# Patient Record
Sex: Female | Born: 1935
Health system: Southern US, Community
[De-identification: ages and names within clinical notes are randomized; demographics above are authoritative.]

## PROBLEM LIST (undated history)

## (undated) DIAGNOSIS — M1990 Unspecified osteoarthritis, unspecified site: Secondary | ICD-10-CM

## (undated) DIAGNOSIS — C189 Malignant neoplasm of colon, unspecified: Secondary | ICD-10-CM

## (undated) DIAGNOSIS — Z9889 Other specified postprocedural states: Secondary | ICD-10-CM

## (undated) DIAGNOSIS — Z923 Personal history of irradiation: Secondary | ICD-10-CM

## (undated) DIAGNOSIS — C50919 Malignant neoplasm of unspecified site of unspecified female breast: Secondary | ICD-10-CM

## (undated) DIAGNOSIS — I8390 Asymptomatic varicose veins of unspecified lower extremity: Secondary | ICD-10-CM

## (undated) DIAGNOSIS — Z86010 Personal history of colonic polyps: Secondary | ICD-10-CM

## (undated) DIAGNOSIS — R112 Nausea with vomiting, unspecified: Secondary | ICD-10-CM

## (undated) DIAGNOSIS — Z860101 Personal history of adenomatous and serrated colon polyps: Secondary | ICD-10-CM

## (undated) HISTORY — DX: Personal history of colonic polyps: Z86.010

## (undated) HISTORY — PX: COLON RESECTION: SHX5231

## (undated) HISTORY — PX: ABDOMINAL HYSTERECTOMY: SHX81

## (undated) HISTORY — PX: BREAST BIOPSY: SHX20

## (undated) HISTORY — DX: Malignant neoplasm of colon, unspecified: C18.9

## (undated) HISTORY — PX: TONSILLECTOMY: SUR1361

## (undated) HISTORY — PX: OVARIAN CYST SURGERY: SHX726

## (undated) HISTORY — DX: Personal history of adenomatous and serrated colon polyps: Z86.0101

## (undated) HISTORY — PX: COLONOSCOPY W/ POLYPECTOMY: SHX1380

---

## 1992-09-21 DIAGNOSIS — C189 Malignant neoplasm of colon, unspecified: Secondary | ICD-10-CM

## 1992-09-21 HISTORY — PX: COLON SURGERY: SHX602

## 1992-09-21 HISTORY — DX: Malignant neoplasm of colon, unspecified: C18.9

## 1998-07-26 ENCOUNTER — Other Ambulatory Visit: Admission: RE | Admit: 1998-07-26 | Discharge: 1998-07-26 | Payer: Self-pay | Admitting: Obstetrics and Gynecology

## 1999-07-09 ENCOUNTER — Encounter: Payer: Self-pay | Admitting: Oncology

## 1999-07-09 ENCOUNTER — Encounter: Admission: RE | Admit: 1999-07-09 | Discharge: 1999-07-09 | Payer: Self-pay | Admitting: Oncology

## 1999-10-02 ENCOUNTER — Other Ambulatory Visit: Admission: RE | Admit: 1999-10-02 | Discharge: 1999-10-02 | Payer: Self-pay | Admitting: Obstetrics and Gynecology

## 2000-04-08 ENCOUNTER — Other Ambulatory Visit: Admission: RE | Admit: 2000-04-08 | Discharge: 2000-04-08 | Payer: Self-pay | Admitting: Gastroenterology

## 2000-07-14 ENCOUNTER — Encounter: Admission: RE | Admit: 2000-07-14 | Discharge: 2000-07-14 | Payer: Self-pay | Admitting: Oncology

## 2000-07-14 ENCOUNTER — Encounter: Payer: Self-pay | Admitting: Oncology

## 2001-02-11 ENCOUNTER — Encounter: Payer: Self-pay | Admitting: Obstetrics and Gynecology

## 2001-02-11 ENCOUNTER — Encounter: Admission: RE | Admit: 2001-02-11 | Discharge: 2001-02-11 | Payer: Self-pay | Admitting: Obstetrics and Gynecology

## 2001-03-21 ENCOUNTER — Other Ambulatory Visit: Admission: RE | Admit: 2001-03-21 | Discharge: 2001-03-21 | Payer: Self-pay | Admitting: Family Medicine

## 2001-07-18 ENCOUNTER — Encounter: Admission: RE | Admit: 2001-07-18 | Discharge: 2001-07-18 | Payer: Self-pay | Admitting: Family Medicine

## 2001-07-18 ENCOUNTER — Encounter: Payer: Self-pay | Admitting: Family Medicine

## 2004-05-22 ENCOUNTER — Encounter (INDEPENDENT_AMBULATORY_CARE_PROVIDER_SITE_OTHER): Payer: Self-pay | Admitting: Gastroenterology

## 2004-12-25 ENCOUNTER — Ambulatory Visit: Payer: Self-pay | Admitting: Family Medicine

## 2004-12-31 ENCOUNTER — Ambulatory Visit: Payer: Self-pay | Admitting: Family Medicine

## 2005-07-02 ENCOUNTER — Ambulatory Visit: Payer: Self-pay | Admitting: Internal Medicine

## 2005-12-04 ENCOUNTER — Ambulatory Visit: Payer: Self-pay | Admitting: Family Medicine

## 2006-12-29 ENCOUNTER — Ambulatory Visit: Payer: Self-pay | Admitting: Gastroenterology

## 2006-12-29 LAB — CONVERTED CEMR LAB
ALT: 19 units/L (ref 0–40)
AST: 22 units/L (ref 0–37)
Albumin: 3.5 g/dL (ref 3.5–5.2)
Alkaline Phosphatase: 56 units/L (ref 39–117)
BUN: 13 mg/dL (ref 6–23)
Basophils Absolute: 0 10*3/uL (ref 0.0–0.1)
Basophils Relative: 0.3 % (ref 0.0–1.0)
CO2: 32 meq/L (ref 19–32)
Calcium: 9.5 mg/dL (ref 8.4–10.5)
Chloride: 107 meq/L (ref 96–112)
Creatinine, Ser: 0.7 mg/dL (ref 0.4–1.2)
Eosinophils Absolute: 0.1 10*3/uL (ref 0.0–0.6)
Eosinophils Relative: 2.4 % (ref 0.0–5.0)
GFR calc Af Amer: 105 mL/min
GFR calc non Af Amer: 87 mL/min
Glucose, Bld: 93 mg/dL (ref 70–99)
HCT: 38.7 % (ref 36.0–46.0)
Hemoglobin: 13.3 g/dL (ref 12.0–15.0)
Lymphocytes Relative: 30.5 % (ref 12.0–46.0)
MCHC: 34.3 g/dL (ref 30.0–36.0)
MCV: 92.1 fL (ref 78.0–100.0)
Monocytes Absolute: 0.4 10*3/uL (ref 0.2–0.7)
Monocytes Relative: 9.9 % (ref 3.0–11.0)
Neutro Abs: 2.4 10*3/uL (ref 1.4–7.7)
Neutrophils Relative %: 56.9 % (ref 43.0–77.0)
Platelets: 232 10*3/uL (ref 150–400)
Potassium: 4 meq/L (ref 3.5–5.1)
RBC: 4.21 M/uL (ref 3.87–5.11)
RDW: 13.3 % (ref 11.5–14.6)
Sed Rate: 17 mm/hr (ref 0–25)
Sodium: 142 meq/L (ref 135–145)
Total Bilirubin: 1 mg/dL (ref 0.3–1.2)
Total Protein: 6.8 g/dL (ref 6.0–8.3)
WBC: 4.2 10*3/uL — ABNORMAL LOW (ref 4.5–10.5)

## 2007-02-03 ENCOUNTER — Ambulatory Visit: Payer: Self-pay | Admitting: Gastroenterology

## 2007-02-16 DIAGNOSIS — M810 Age-related osteoporosis without current pathological fracture: Secondary | ICD-10-CM

## 2007-02-16 DIAGNOSIS — Z85038 Personal history of other malignant neoplasm of large intestine: Secondary | ICD-10-CM | POA: Insufficient documentation

## 2007-02-16 DIAGNOSIS — K449 Diaphragmatic hernia without obstruction or gangrene: Secondary | ICD-10-CM | POA: Insufficient documentation

## 2007-05-03 ENCOUNTER — Telehealth (INDEPENDENT_AMBULATORY_CARE_PROVIDER_SITE_OTHER): Payer: Self-pay | Admitting: *Deleted

## 2007-05-05 ENCOUNTER — Encounter: Payer: Self-pay | Admitting: Family Medicine

## 2007-05-19 ENCOUNTER — Encounter (INDEPENDENT_AMBULATORY_CARE_PROVIDER_SITE_OTHER): Payer: Self-pay | Admitting: *Deleted

## 2007-06-22 ENCOUNTER — Encounter: Payer: Self-pay | Admitting: Family Medicine

## 2007-07-29 ENCOUNTER — Telehealth (INDEPENDENT_AMBULATORY_CARE_PROVIDER_SITE_OTHER): Payer: Self-pay | Admitting: *Deleted

## 2007-09-01 ENCOUNTER — Telehealth (INDEPENDENT_AMBULATORY_CARE_PROVIDER_SITE_OTHER): Payer: Self-pay | Admitting: *Deleted

## 2008-11-08 ENCOUNTER — Telehealth: Payer: Self-pay | Admitting: Internal Medicine

## 2008-11-08 ENCOUNTER — Ambulatory Visit: Payer: Self-pay | Admitting: Internal Medicine

## 2008-11-08 DIAGNOSIS — K644 Residual hemorrhoidal skin tags: Secondary | ICD-10-CM | POA: Insufficient documentation

## 2008-11-22 ENCOUNTER — Telehealth: Payer: Self-pay | Admitting: Nurse Practitioner

## 2008-11-23 ENCOUNTER — Ambulatory Visit: Payer: Self-pay | Admitting: Gastroenterology

## 2008-12-14 ENCOUNTER — Telehealth: Payer: Self-pay | Admitting: Nurse Practitioner

## 2008-12-14 ENCOUNTER — Ambulatory Visit: Payer: Self-pay | Admitting: Internal Medicine

## 2009-05-08 ENCOUNTER — Encounter: Admission: RE | Admit: 2009-05-08 | Discharge: 2009-05-08 | Payer: Self-pay | Admitting: Internal Medicine

## 2011-02-04 ENCOUNTER — Ambulatory Visit (AMBULATORY_SURGERY_CENTER): Payer: Medicare Other | Admitting: *Deleted

## 2011-02-04 ENCOUNTER — Telehealth: Payer: Self-pay | Admitting: *Deleted

## 2011-02-04 VITALS — Ht 64.0 in | Wt 142.1 lb

## 2011-02-04 DIAGNOSIS — Z85038 Personal history of other malignant neoplasm of large intestine: Secondary | ICD-10-CM

## 2011-02-04 DIAGNOSIS — Z86010 Personal history of colonic polyps: Secondary | ICD-10-CM

## 2011-02-04 MED ORDER — PEG-KCL-NACL-NASULF-NA ASC-C 100 G PO SOLR: ORAL | Status: DC

## 2011-02-04 NOTE — Telephone Encounter (Signed)
Patient has had multiple colonoscopies with Dr.Sam. Personal hx colon cancer and colon polyps. Last colonoscopy was 02/03/2007,recall every 4 years per report. Patient is direct,previsit done today. Ok for colonoscopy at this time? Chart on your desk.

## 2011-02-04 NOTE — Progress Notes (Signed)
Cannot find patient's pharmacy at this time. She believes its the [facility] on [address]. Print moviprep & gave to patient at this time.

## 2011-02-05 NOTE — Telephone Encounter (Signed)
Sheri - please call her and tell her current guidelines and my recommendation is to wait one more year for colonoscopy. We should cancel this one and replace recall. This assumes she is asymptomatic.

## 2011-02-05 NOTE — Telephone Encounter (Signed)
Patient does not have any symptoms, but she is insistent that she would like to proceed with colon.  I did explain no polyps at her last colon in 2008, but she wants to proceed with the colon as planned.  Dr Leone Payor please advise if ok.

## 2011-02-06 NOTE — Telephone Encounter (Signed)
I have left a message for the patient that she may keep her appt and she is asked to call back for any questions.

## 2011-02-06 NOTE — Assessment & Plan Note (Signed)
[facility]                         GASTROENTEROLOGY OFFICE NOTE   NAME:Teresa Boyd, Teresa Boyd                      MRN:          [id number]  DATE:12/29/2006                            DOB:          1931-05-22    This nice lady comes in on April 9. She said she wanted to see me before  she retired. She had a significant colon cancer that was resected a  number of years previous, she had some followup colon polyps. She said  she wanted to get a colonoscopy now.   PAST MEDICAL HISTORY:  She is doing alright as far as her GI system is  concerned. Her past medical history reveals only some minimal arthritis  as well as the colon cancer. She is status post hysterectomy and  appendectomy.   SOCIAL HISTORY:  She is still working, semi-retired, she is widowed. She  does not drink or smoke.   FAMILY HISTORY:  Diabetes.   REVIEW OF SYSTEMS:  Arthritis and skin rash.   PHYSICAL EXAMINATION:  VITAL SIGNS:  She is 5 feet 5, weighs 148. Blood  pressure is 122/84, pulse 80 and regular.  Neck, heart and extremities are all unremarkable.   IMPRESSION:  1. Status post colon cancer with hemicolectomy.  2. Arthritis.  3. Gastroesophageal reflux disease.   RECOMMENDATIONS:  Get some routine labs and schedule this patient for a  colonoscopic examination sometime in the near future.     Ulyess Mort, MD  Electronically Signed    SML/MedQ  DD: 12/29/2006  DT: 12/30/2006  Job #: [id number]

## 2011-02-06 NOTE — Telephone Encounter (Signed)
She may keep her appointment for colonoscopy now.

## 2011-02-18 ENCOUNTER — Ambulatory Visit (AMBULATORY_SURGERY_CENTER): Payer: Medicare Other | Admitting: Internal Medicine

## 2011-02-18 ENCOUNTER — Encounter: Payer: Self-pay | Admitting: Internal Medicine

## 2011-02-18 VITALS — BP 131/52 | HR 60 | Temp 98.3°F | Resp 14 | Ht 64.0 in | Wt 145.0 lb

## 2011-02-18 DIAGNOSIS — Z800 Family history of malignant neoplasm of digestive organs: Secondary | ICD-10-CM

## 2011-02-18 DIAGNOSIS — D126 Benign neoplasm of colon, unspecified: Secondary | ICD-10-CM

## 2011-02-18 DIAGNOSIS — Z85038 Personal history of other malignant neoplasm of large intestine: Secondary | ICD-10-CM

## 2011-02-18 DIAGNOSIS — Z1211 Encounter for screening for malignant neoplasm of colon: Secondary | ICD-10-CM

## 2011-02-18 DIAGNOSIS — Z86010 Personal history of colonic polyps: Secondary | ICD-10-CM

## 2011-02-18 MED ORDER — SODIUM CHLORIDE 0.9 % IV SOLN: 500.0000 mL | INTRAVENOUS | Status: DC

## 2011-02-18 NOTE — Patient Instructions (Signed)
DISCHARGED instructions given with verbal understanding. Handout on polyps given. Resume previous medications.

## 2011-02-19 ENCOUNTER — Telehealth: Payer: Self-pay | Admitting: *Deleted

## 2011-02-19 NOTE — Telephone Encounter (Signed)
Follow up Call- Patient questions:  Do you have a fever, pain , or abdominal swelling? no Pain Score  0 *  Have you tolerated food without any problems? yes  Have you been able to return to your normal activities? yes  Do you have any questions about your discharge instructions: Diet   no Medications  no Follow up visit  yes  Do you have questions or concerns about your Care? no  Actions: * If pain score is 4 or above: No action needed, pain <4.  Discussed with pt that follow up colonoscopy would be dependent on pathology results returned.

## 2011-03-06 ENCOUNTER — Encounter: Payer: Self-pay | Admitting: Internal Medicine

## 2011-03-06 NOTE — Progress Notes (Signed)
Quick Note:  Adenoma in setting of prior colon cancer and polyps and mother with colon cancer Plan on routine repeat colonoscopy in 5 years given her situation but health status to be assessed in person at that time ______

## 2011-03-09 ENCOUNTER — Telehealth: Payer: Self-pay | Admitting: Internal Medicine

## 2011-03-09 NOTE — Telephone Encounter (Signed)
I reviewed the results of the colon path with the patient and the letter that was mailed out on Friday.  She is advised that she should be getting the letter in the mail in the next day or so.  She will call back for any questions

## 2011-12-30 DIAGNOSIS — C189 Malignant neoplasm of colon, unspecified: Secondary | ICD-10-CM | POA: Insufficient documentation

## 2011-12-30 DIAGNOSIS — D72819 Decreased white blood cell count, unspecified: Secondary | ICD-10-CM | POA: Insufficient documentation

## 2011-12-30 DIAGNOSIS — Z8619 Personal history of other infectious and parasitic diseases: Secondary | ICD-10-CM | POA: Insufficient documentation

## 2012-04-05 ENCOUNTER — Other Ambulatory Visit: Payer: Self-pay | Admitting: Physician Assistant

## 2012-04-05 DIAGNOSIS — Z1231 Encounter for screening mammogram for malignant neoplasm of breast: Secondary | ICD-10-CM

## 2012-04-05 DIAGNOSIS — M810 Age-related osteoporosis without current pathological fracture: Secondary | ICD-10-CM

## 2012-06-22 ENCOUNTER — Ambulatory Visit (HOSPITAL_COMMUNITY): Payer: Medicare Other

## 2012-06-27 ENCOUNTER — Telehealth: Payer: Self-pay | Admitting: Internal Medicine

## 2012-06-28 NOTE — Telephone Encounter (Signed)
Patient had questions about non surgical options for hemorrhoids.   She is not having any current problems, but she has a friend that is.  Discussed injection and banding.  She will call back for any additional questions

## 2012-08-09 ENCOUNTER — Ambulatory Visit (HOSPITAL_COMMUNITY)
Admission: RE | Admit: 2012-08-09 | Discharge: 2012-08-09 | Disposition: A | Discharge status: Home / Self Care | Payer: Medicare Other | Source: Ambulatory Visit | Attending: Physician Assistant | Admitting: Physician Assistant

## 2012-08-09 DIAGNOSIS — Z1231 Encounter for screening mammogram for malignant neoplasm of breast: Secondary | ICD-10-CM | POA: Insufficient documentation

## 2012-08-23 ENCOUNTER — Other Ambulatory Visit: Payer: Self-pay | Admitting: Physician Assistant

## 2012-08-23 DIAGNOSIS — R928 Other abnormal and inconclusive findings on diagnostic imaging of breast: Secondary | ICD-10-CM

## 2012-09-01 ENCOUNTER — Ambulatory Visit
Admission: RE | Admit: 2012-09-01 | Discharge: 2012-09-01 | Disposition: A | Discharge status: Home / Self Care | Payer: Medicare Other | Source: Ambulatory Visit | Attending: Physician Assistant | Admitting: Physician Assistant

## 2012-09-01 DIAGNOSIS — R928 Other abnormal and inconclusive findings on diagnostic imaging of breast: Secondary | ICD-10-CM

## 2012-09-02 ENCOUNTER — Other Ambulatory Visit: Payer: Self-pay | Admitting: Physician Assistant

## 2012-09-12 ENCOUNTER — Other Ambulatory Visit: Payer: Self-pay | Admitting: Family Medicine

## 2012-09-12 ENCOUNTER — Other Ambulatory Visit: Payer: Self-pay | Admitting: Physician Assistant

## 2012-12-26 ENCOUNTER — Other Ambulatory Visit: Payer: Self-pay | Admitting: Obstetrics and Gynecology

## 2014-01-31 DIAGNOSIS — R350 Frequency of micturition: Secondary | ICD-10-CM | POA: Insufficient documentation

## 2015-03-14 ENCOUNTER — Encounter: Payer: Self-pay | Admitting: Gastroenterology

## 2015-09-22 DIAGNOSIS — C50919 Malignant neoplasm of unspecified site of unspecified female breast: Secondary | ICD-10-CM

## 2015-09-22 HISTORY — PX: BREAST LUMPECTOMY: SHX2

## 2015-09-22 HISTORY — DX: Malignant neoplasm of unspecified site of unspecified female breast: C50.919

## 2016-03-06 ENCOUNTER — Other Ambulatory Visit: Payer: Self-pay | Admitting: General Surgery

## 2016-03-06 DIAGNOSIS — R928 Other abnormal and inconclusive findings on diagnostic imaging of breast: Secondary | ICD-10-CM

## 2016-03-06 DIAGNOSIS — N6320 Unspecified lump in the left breast, unspecified quadrant: Secondary | ICD-10-CM

## 2016-03-17 ENCOUNTER — Ambulatory Visit
Admission: RE | Admit: 2016-03-17 | Discharge: 2016-03-17 | Disposition: A | Discharge status: Home / Self Care | Payer: Medicare Other | Source: Ambulatory Visit | Attending: General Surgery | Admitting: General Surgery

## 2016-03-17 ENCOUNTER — Other Ambulatory Visit: Payer: Self-pay | Admitting: General Surgery

## 2016-03-17 DIAGNOSIS — R928 Other abnormal and inconclusive findings on diagnostic imaging of breast: Secondary | ICD-10-CM

## 2016-03-17 DIAGNOSIS — N6320 Unspecified lump in the left breast, unspecified quadrant: Secondary | ICD-10-CM

## 2016-03-17 DIAGNOSIS — N6310 Unspecified lump in the right breast, unspecified quadrant: Secondary | ICD-10-CM

## 2016-03-17 MED ORDER — GADOBENATE DIMEGLUMINE 529 MG/ML IV SOLN
13.0000 mL | Freq: Once | INTRAVENOUS | Status: AC | PRN
  Administered 2016-03-17: 13 mL via INTRAVENOUS

## 2016-03-20 ENCOUNTER — Telehealth: Payer: Self-pay | Admitting: Oncology

## 2016-03-20 ENCOUNTER — Encounter: Payer: Self-pay | Admitting: Oncology

## 2016-03-20 ENCOUNTER — Telehealth: Payer: Self-pay | Admitting: Internal Medicine

## 2016-03-20 NOTE — Telephone Encounter (Signed)
Appointment with Dr. Jana Hakim on 7/7 at 3:15. Patient aware to arrive at 2:45pm. Demographics verified, directions given. Letter to the referring.

## 2016-03-20 NOTE — Telephone Encounter (Signed)
The pt states she has a return of breast cancer and will have treatment in a few weeks and would like to go ahead and set up recall colon. Is this ok Dr Carlean Purl?

## 2016-03-24 NOTE — Telephone Encounter (Signed)
She should see me in office to discuss her situation - I do not think its urgent

## 2016-03-25 NOTE — Telephone Encounter (Signed)
Left message for patient to call back  

## 2016-03-25 NOTE — Telephone Encounter (Signed)
Patient notified of recommendations.  She is scheduled at 4:00 05/13/16

## 2016-03-27 ENCOUNTER — Ambulatory Visit (HOSPITAL_BASED_OUTPATIENT_CLINIC_OR_DEPARTMENT_OTHER): Payer: Medicare Other | Admitting: Oncology

## 2016-03-27 VITALS — BP 157/81 | HR 76 | Temp 98.1°F | Resp 18 | Ht 64.0 in | Wt 138.3 lb

## 2016-03-27 DIAGNOSIS — Z8371 Family history of colonic polyps: Secondary | ICD-10-CM | POA: Diagnosis not present

## 2016-03-27 DIAGNOSIS — Z85038 Personal history of other malignant neoplasm of large intestine: Secondary | ICD-10-CM

## 2016-03-27 DIAGNOSIS — Z170 Estrogen receptor positive status [ER+]: Secondary | ICD-10-CM

## 2016-03-27 DIAGNOSIS — C50412 Malignant neoplasm of upper-outer quadrant of left female breast: Secondary | ICD-10-CM

## 2016-03-27 DIAGNOSIS — Z808 Family history of malignant neoplasm of other organs or systems: Secondary | ICD-10-CM

## 2016-03-27 NOTE — Progress Notes (Signed)
Teresa Boyd  [phone] [phone]     ID: Teresa Boyd DOB: [birth year]  MR#: [id number]  DJT#:[id number]  Patient Care Team: De Nurse, MD as PCP - General (Family Medicine) [name], MD as Consulting Physician (General Surgery) [name], MD as Consulting Physician (Gastroenterology) Chauncey Cruel, MD as Consulting Physician (Oncology) [name], MD as Attending Physician (Radiation Oncology) [name], MD as Consulting Physician (Family Medicine) OTHER MD:  CHIEF COMPLAINT: Estrogen receptor positive invasive breast cancer  CURRENT TREATMENT: Awaiting definitive surgery   BREAST CANCER HISTORY:  Teresa Boyd herself palpated a mass in her left breast mid-June 2017. She brought it to the attention of Dr. Coletta Memos who obtained screening mammography showing possible abnormalities in both breasts. [name] was then [facility] and ultrasonography 03/18/2015 showed in the right breast an 8 mm spiculated mass at the 11:00 position 5 cm from the nipple and a second area of altered architecture also in the upper outer quadrant. In the left breast there was a 2.5 cm is spiculated mass in the upper outer quadrant 4 cm from the nipple. Ultrasound of both axillae were negative.  On 03/17/2016 the patient underwent biopsy of all 3 suspicious areas. In the right breast, both areas showed only fibrocystic change, with no evidence of malignancy ([id number] [id number]). This was read as concordant by radiology.  In the left breast however the final pathology (SAA [id number]) showed an invasive ductal carcinoma, grade 2 or 3, estrogen receptor 5% positive, with moderate staining intensity, progesterone receptor negative, with an MIB-1 of 5%, and no HER-2 amplification, the signals ratio being 1.53 and the number per cell 3.45.  Teresa Boyd also has a remote history of colon cancer, in remission for more than 10 years with  the most recent colonoscopy 2012 negative  INTERVAL HISTORY: Teresa Boyd was evaluated in the breast clinic 03/27/2016 accompanied by her sister Teresa Boyd. Her son Teresa Boyd participated by phone. Teresa Boyd's case was also presented in the multidisciplinary breast cancer conference 03/25/2016. At that time a preliminary plan was proposed: Left lumpectomy without sentinel lymph node sampling. Consideration of adjuvant radiation depending on surgical results, and consideration of adjuvant anti-estrogens  REVIEW OF SYSTEMS: Aside from the mass itself, there were no specific symptoms leading to the original mammogram, which was routinely scheduled. The patient denies unusual headaches, visual changes, nausea, vomiting, stiff neck, dizziness, or gait imbalance. There has been no cough, phlegm production, or pleurisy, no chest pain or pressure, and no change in bowel or bladder habits. The patient denies fever, rash, bleeding, unexplained fatigue or unexplained weight loss. A detailed review of systems was otherwise entirely negative.    PAST MEDICAL HISTORY: Past Medical History  Diagnosis Date  . Colon cancer 1994  . Hx of adenomatous colonic polyps     PAST SURGICAL HISTORY: Past Surgical History  Procedure Laterality Date  . Colon resection      sigmoid  . Ovarian cyst surgery    . Abdominal hysterectomy    . Tonsillectomy    . Colonoscopy w/ polypectomy  last 02/18/11    multiple, prior colon cancer and polyps, 7 mm adenoma 2012    FAMILY HISTORY Family History  Problem Relation Age of Onset  . Colon cancer Mother 13  . Colon polyps Father 83  The patient's father died at age 31 from postoperative complications in the setting of peptic ulcer disease. The patient's mother died at age 77 with colon cancer.  The patient had 5 brothers, 2 sisters. One brother died in an automobile accident. One brother has colitis. A maternal aunt developed cancer in her "female organs" in her 78s. The patient has no  further information regarding this. She is not aware of any other cancers in the family aside of course from her own remote colon cancer.  GYNECOLOGIC HISTORY:  No LMP recorded. Patient is postmenopausal. Menarche age 70, first live birth age 56. The patient is Teresa Boyd P1 area in 1955 she underwent total abdominal hysterectomy with bilateral salpingo-oophorectomy. She tried hormone replacement briefly (less than a year). She never took oral contraceptives.  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 She is widowed and lives by herself with no pets. Her son Teresa Boyd is a pulmonologist in [place], [place]. The patient has 4 grandchildren, and no great grandchildren. She attends [employer] locally. 3 of her siblings live in town including of course her sister Teresa Boyd, who is a retired Rec[employer] consultant    ADVANCED DIRECTIVES:At the 03/27/2016 visit the patient was given the appropriate forms to complete and notarize at her discretion. She intends to name her son Teresa Boyd as healthcare power of attorney. He can be reached at [phone]  HEALTH MAINTENANCE: Social History  Substance Use Topics  . Smoking status: Never Smoker   . Smokeless tobacco: Never Used  . Alcohol Use: No     Colonoscopy:02/18/2011, [facility]; tubular adenoma with no high-grade dysplasia  Bone density: [facility], 01/08/2010, T score -2.2, decreased from prior   Allergies  Allergen Reactions  . Celebrex [Celecoxib]   . Clindamycin   . Conjugated Estrogens   . Estradiol Benzoate   . Estrogens Conjugated   . Hypaque-M [Diatrizone Sodium-Diatrizone Meglumine] Hives    Ct scan IVP dye  . Penicillins     Current Outpatient Prescriptions  Medication Sig Dispense Refill  . calcitonin, salmon, (MIACALCIN) 200 UNIT/ACT nasal spray 1 spray by Nasal route daily.      Marland Kitchen CALCIUM PO Take 1 tablet by mouth 2 (two) times daily.      . Multiple Vitamin (MULTIVITAMIN) tablet Take 1 tablet by mouth  daily.       Current Facility-Administered Medications  Medication Dose Route Frequency Provider Last Rate Last Dose  . 0.9 %  sodium chloride infusion  500 mL Intravenous Continuous [name], MD        OBJECTIVE: Older white woman in no acute distress  Filed Vitals:   03/27/16 1449  BP: 157/81  Pulse: 76  Temp: 98.1 F (36.7 C)  Resp: 18     Body mass index is 23.73 kg/(m^2).    ECOG FS:0 - Asymptomatic  Ocular: Sclerae unicteric, pupils equal, round and reactive to light Ear-nose-throat: Oropharynx clear and moist Lymphatic: No cervical or supraclavicular adenopathy Lungs no rales or rhonchi, good excursion bilaterally Heart regular rate and rhythm, no murmur appreciated Abd soft, nontender, positive bowel sounds MSK no focal spinal tenderness, no joint edema Neuro: non-focal, well-oriented, appropriate affect Breasts: The right breast is status post recent biopsies. There is minimal ecchymosis. There is no finding of concern. The right axilla is benign. The left breast is also status post recent biopsy. In the upper outer quadrant, there is a palpable mass measuring approximately 2 cm, not associated with erythema or tenderness. There is no nipple inversion. The left axilla is benign.   LAB RESULTS:  CMP     Component Value Date/Time   NA 142 12/29/2006  1210   K 4.0 12/29/2006 1210   CL 107 12/29/2006 1210   CO2 32 12/29/2006 1210   GLUCOSE 93 12/29/2006 1210   BUN 13 12/29/2006 1210   CREATININE 0.7 12/29/2006 1210   CALCIUM 9.5 12/29/2006 1210   PROT 6.8 12/29/2006 1210   ALBUMIN 3.5 12/29/2006 1210   AST 22 12/29/2006 1210   ALT 19 12/29/2006 1210   ALKPHOS 56 12/29/2006 1210   BILITOT 1.0 12/29/2006 1210   GFRNONAA 87 12/29/2006 1210   GFRAA 105 12/29/2006 1210    INo results found for: SPEP, UPEP  Lab Results  Component Value Date   WBC 4.2* 12/29/2006   NEUTROABS 2.4 12/29/2006   HGB 13.3 12/29/2006   HCT 38.7 12/29/2006   MCV 92.1 12/29/2006     PLT 232 12/29/2006      Chemistry      Component Value Date/Time   NA 142 12/29/2006 1210   K 4.0 12/29/2006 1210   CL 107 12/29/2006 1210   CO2 32 12/29/2006 1210   BUN 13 12/29/2006 1210   CREATININE 0.7 12/29/2006 1210      Component Value Date/Time   CALCIUM 9.5 12/29/2006 1210   ALKPHOS 56 12/29/2006 1210   AST 22 12/29/2006 1210   ALT 19 12/29/2006 1210   BILITOT 1.0 12/29/2006 1210       No results found for: LABCA2  No components found for: LABCA125  No results for input(s): INR in the last 168 hours.  Urinalysis No results found for: COLORURINE, APPEARANCEUR, LABSPEC, PHURINE, GLUCOSEU, HGBUR, BILIRUBINUR, [place], PROTEINUR, UROBILINOGEN, NITRITE, LEUKOCYTESUR   STUDIES: Mm Diag Breast Tomo Bilateral  03/17/2016  CLINICAL DATA:  Status post MRI guided biopsy of left breast 12 o'clock mass, ultrasound-guided biopsy of right breast 11 o'clock mass, and stereotactic guided biopsy of right breast upper outer quadrant posterior focal asymmetry. EXAM: DIAGNOSTIC BILATERAL MAMMOGRAM POST MRI, ULTRASOUND AND STEREOTACTIC BIOPSY COMPARISON:  Previous exam(s). FINDINGS: Mammographic images were obtained following MRI guided biopsy of left breast 12 o'clock mass, ultrasound-guided biopsy of right breast 11 o'clock anterior depth mass, and stereotactic core needle biopsy of right breast upper outer quadrant posterior depth focal asymmetry. Two-view left breast mammography demonstrates presence of dumbbell-shaped tissue marker within spiculated left breast 12 o'clock mass. Expected post biopsy changes are seen. Two-view right breast mammography demonstrates presence of ribbon shaped tissue marker within right breast 11 o'clock mass, anterior depth. X shaped tissue marker is present within the right breast upper outer quadrant, posterior depth. The X shaped marker, placed after stereotactic core needle biopsy of the right breast upper outer quadrant posterior focal asymmetry, is  noted to be located 4 cm inferior to the biopsy site on the mediolateral oblique view, and overlies the biopsy cavity on the craniocaudal view. Post biopsy changes are noted. IMPRESSION: Successful placement of dumbbell-shaped tissue marker within left breast 12 o'clock mass, post MRI guided core needle biopsy. Successful placement of coil shaped tissue marker within the right breast 11 o'clock mass, post ultrasound-guided core needle biopsy. Successful placement of X shaped tissue marker within the right breast upper outer quadrant, posterior depth, focal asymmetry, post stereotactic guided core needle biopsy. The X shaped tissue marker is noted to be located 4 cm inferior to the biopsy site. The biopsy site is marked on the postprocedural images, should this area need to be excised. Final Assessment: Post Procedure Mammograms for Marker Placement Electronically Signed   By: Fidela Salisbury M.D.   On: 03/17/2016 14:10  Mr Aundra Millet Breast Bx Johnella Moloney Dev 1st Lesion Image Bx Spec Mr Guide  03/18/2016  ADDENDUM REPORT: 03/18/2016 11:54 ADDENDUM: Pathology revealed grade II to III invasive ductal carcinoma and ductal carcinoma in situ in the left breast. The right breast at 11:00 showed fibrocystic changes and dense fibrosis. The upper outer quadrant of the right breast showed fibrocystic changes. This was found to be concordant by Dr. Fidela Salisbury. Pathology results were discussed with the patient by telephone. The patient reported doing well after the biopsy. Post biopsy instructions and care were reviewed and questions were answered. The patient was encouraged to call [facility] for any additional concerns. She has seen Dr. [name] at [facility] and has a follow-up appointment on March 19, 2016. Pathology results reported by Susa Raring RN, BSN on 03/18/2016. Electronically Signed   By: Fidela Salisbury M.D.   On: 03/18/2016 11:54  03/18/2016   CLINICAL DATA:  Suspicious left 12 o'clock breast mass. EXAM: MRI GUIDED CORE NEEDLE BIOPSY OF THE LEFT BREAST TECHNIQUE: Multiplanar, multisequence MR imaging of the bilateral breasts was performed both before and after administration of intravenous contrast. CONTRAST:  44m MULTIHANCE GADOBENATE DIMEGLUMINE 529 MG/ML IV SOLN COMPARISON:  Previous exams. FINDINGS: I met with the patient, and we discussed the procedure of MRI guided biopsy, including risks, benefits, and alternatives. Specifically, we discussed the risks of infection, bleeding, tissue injury, clip migration, and inadequate sampling. Informed, written consent was given. The usual time out protocol was performed immediately prior to the procedure. Using sterile technique, 1% Lidocaine, MRI guidance, and a 9 gauge vacuum assisted device, biopsy was performed of left 12 o'clock breast mass using a lateral approach. At the conclusion of the procedure, a dumbbell-shaped tissue marker clip was deployed into the biopsy cavity. Follow-up 2-view mammogram was performed and dictated separately. IMPRESSION: MRI guided biopsy of left 12 o'clock breast mass. No apparent complications. Electronically Signed: By: DFidela SalisburyM.D. On: 03/17/2016 13:58   Mm Rt Breast Bx W Loc Dev 1st Lesion Image Bx Spec Stereo Guide  03/18/2016  ADDENDUM REPORT: 03/18/2016 11:55 ADDENDUM: Pathology revealed grade II to III invasive ductal carcinoma and ductal carcinoma in situ in the left breast. The right breast at 11:00 showed fibrocystic changes and dense fibrosis. The upper outer quadrant of the right breast showed fibrocystic changes. This was found to be concordant by Dr. DFidela Salisbury Pathology results were discussed with the patient by telephone. The patient reported doing well after the biopsy. Post biopsy instructions and care were reviewed and questions were answered. [facility]. Pathology results reported by LSusa RaringRN, BSN on 03/18/2016. Electronically Signed   By: DFidela SalisburyM.D.   On: 03/18/2016 11:55  03/18/2016  CLINICAL DATA:  Right breast upper outer quadrant mass/ focal asymmetry seen mammographically. EXAM: RIGHT BREAST STEREOTACTIC CORE NEEDLE BIOPSY COMPARISON:  Previous exams. FINDINGS: A stereotactic biopsy was performed of a focal asymmetry seen mammographically in the right breast upper outer quadrant, posterior depth. The patient and I discussed the procedure of stereotactic-guided biopsy including benefits and alternatives. We discussed the high likelihood of a successful procedure. We discussed the risks of the procedure including infection, bleeding, tissue injury, clip migration, and inadequate sampling. Informed written consent was given. The  usual time out protocol was performed immediately prior to the procedure. Using sterile technique and 1% Lidocaine as local anesthetic, under stereotactic guidance, a 9 gauge vacuum assisted device was used to perform core needle biopsy of mass/focal asymmetry in the right breast upper outer quadrant, posterior depth using a superior approach. Specimen radiograph was not performed as no radiographically apparent calcifications were identified. At the conclusion of the procedure, a X shaped tissue marker clip was deployed into the biopsy cavity. Follow-up 2-view mammogram was performed and dictated separately. IMPRESSION: Stereotactic-guided biopsy of right breast upper outer quadrant focal asymmetry/mass. No apparent complications. Electronically Signed: By: Fidela Salisbury M.D. On: 03/17/2016 13:50   [place] Rt Breast Bx W Loc Dev 1st Lesion Img Bx Spec US Guide  03/18/2016  ADDENDUM REPORT: 03/18/2016 11:54 ADDENDUM: Pathology revealed grade II to III invasive ductal  carcinoma and ductal carcinoma in situ in the left breast. The right breast at 11:00 showed fibrocystic changes and dense fibrosis. The upper outer quadrant of the right breast showed fibrocystic changes. This was found to be concordant by Dr. Fidela Salisbury. Pathology results were discussed with the patient by telephone. The patient reported doing well after the biopsy. Post biopsy instructions and care were reviewed and questions were answered. The patient was encouraged to call [facility] for any additional concerns. She has seen Dr. [name] at [facility] and has a follow-up appointment on March 19, 2016. Pathology results reported by Susa Raring RN, BSN on 03/18/2016. Electronically Signed   By: Fidela Salisbury M.D.   On: 03/18/2016 11:54  03/18/2016  CLINICAL DATA:  Right 11 o'clock breast mass. EXAM: ULTRASOUND GUIDED RIGHT BREAST CORE NEEDLE BIOPSY COMPARISON:  Previous exam(s). FINDINGS: I met with the patient and we discussed the procedure of ultrasound-guided biopsy, including benefits and alternatives. We discussed the high likelihood of a successful procedure. We discussed the risks of the procedure, including infection, bleeding, tissue injury, clip migration, and inadequate sampling. Informed written consent was given. The usual time-out protocol was performed immediately prior to the procedure. Using sterile technique and 1% Lidocaine as local anesthetic, under direct ultrasound visualization, a 14 gauge spring-loaded device was used to perform biopsy of right 11 o'clock breast mass using a inferior approach. At the conclusion of the procedure a ribbon shaped tissue marker clip was deployed into the biopsy cavity. Follow up 2 view mammogram was performed and dictated separately. Judging by the position of the ribbon shaped tissue marker, the sonographically identified and biopsied mass does not correspond to the originally  mammographically identified focal asymmetry in the right breast upper outer quadrant, posterior depth. Therefore, after discussing this fact with the patient and obtaining consent from her, a second stereotactically guided right breast biopsy was performed for the posterior right breast upper outer quadrant mammographically identified focal asymmetry/mass. IMPRESSION: Ultrasound guided biopsy of right 11 o'clock breast mass. No apparent complications. Electronically Signed: By: Fidela Salisbury M.D. On: 03/17/2016 14:01    ELIGIBLE FOR AVAILABLE RESEARCH PROTOCOL: PALLAS  ASSESSMENT: 82 y.o. Teresa Boyd woman status post left breast upper outer quadrant biopsy 03/17/2016 for a clinical T2 N0, stage IIa invasive ductal carcinoma, grade 2 or 3, minimally estrogen receptor positive at 5% with moderate staining intensity, progesterone receptor and HER-2 negative, with an MIB-1 of 5%  (1) definitive surgery pending  (2) consideration of adjuvant radiation depending on surgical results  (3) consideration of adjuvant anti-estrogens  PLAN: We spent the better part  of today's hour-long appointment discussing the biology of breast cancer in general, and the specifics of the patient's tumor in particular. We first reviewed the fact that cancer is not one disease but more than 100 different diseases and that it is important to keep them separate-- otherwise when friends and relatives discuss their own cancer experiences with Teresa Boyd confusion can result. Similarly we explained that if breast cancer spreads to the bone or liver, the patient would not have bone cancer or liver cancer, but breast cancer in the bone and breast cancer in the liver: one cancer in three places-- not 3 different cancers which otherwise would have to be treated in 3 different ways.  We discussed the difference between local and systemic therapy. In terms of loco-regional treatment, lumpectomy plus radiation is equivalent to mastectomy  as far as survival is concerned. For this reason we generally recommend breast conserving surgery. She understands there may be some distortion of the breast contour postop, but this is not a major concern to her.   We also noted that in terms of sequencing of treatments, whether systemic therapy or surgery is done first does not affect the ultimate outcome.  Finally, while we frequently forego adjuvant radiation in women over 34, my recommendation in Teresa Boyd's case is that she do receive radiation. Her cancer is just about triple negative and I don't think anti-estrogens will provide significant assurance in terms of preventing a recurrence. In addition the cancer is stage II. Given these facts and her excellent functional status, I am arranging for her to meet with Dr. Isidore Moos and radiation early August, by which time the patient should have had her definitive surgery.  We then discussed the rationale for systemic therapy. There is some risk that this cancer may have already spread to other parts of her body. Patients frequently ask about bone scans CAT scans and PET scans when they understand this, but in early stage disease we are much more likely to find false positives then to cancers and this would expose the patient to unnecessary procedures as well as unnecessary radiation. Scans cannot answer the question the patient really would like to know, which is whether she has microscopic disease elsewhere in her body.  Of course we would proceed to aggressive evaluation of any symptoms that might suggest metastatic disease, but that is not the case here.  We then discussed the options for systemic therapy which are anti-estrogens, anti-HER-2 immunotherapy, and chemotherapy. Chemotherapy generally works best in rapidly growing aggressive tumors. We have a slow-growing tumor which is however on the aggressive side as far as grade is concerned. I think a Mammaprint's test will help [place] resolve this conundrum  but the patient understands I'm going to be very reluctant to proceed to chemotherapy unless we have a clear indication of benefit, which is not the case at this point.  She does not meet criteria for anti-HER-2 immunotherapy. She is a candidate for anti-estrogens. The concern of course is that aromatase inhibitors can worsen her osteopenia or osteoporosis, and that tamoxifen can cause blood clots. We're going to return to these questions when she sees me again, at the end of her radiation treatments. Given the fact that the cancer is only minimally estrogen receptor positive, we may choose observation alone.  Teresa Boyd has a good understanding of the overall plan. She agrees with it. She knows the goal of treatment in her case is cure. She will call with any problems that may develop before her next  visit here.  Chauncey Cruel, MD   03/28/2016 11:22 AM Medical Oncology and Hematology [facility] [address] [place], [place] [address] Tel. [phone]    Fax. [phone]

## 2016-04-02 ENCOUNTER — Telehealth: Payer: Self-pay | Admitting: *Deleted

## 2016-04-02 NOTE — Telephone Encounter (Signed)
  Oncology Nurse Navigator Documentation  Navigator Location: [facility]Med Onc (04/02/16 1500) Navigator Encounter Type: Introductory phone call (04/02/16 1500)   Abnormal Finding Date: 03/17/16 (04/02/16 1500) Confirmed Diagnosis Date: 03/17/16 (04/02/16 1500)     Patient Visit Type: MedOnc;Initial (04/02/16 1500) Treatment Phase: Pre-Tx/Tx Discussion (04/02/16 1500) Barriers/Navigation Needs: No barriers at this time (04/02/16 1500)                Acuity: Level 2 (04/02/16 1500)   Acuity Level 2: Initial guidance, education and coordination as needed;Educational needs;Assistance expediting appointments;Ongoing guidance and education throughout treatment as needed (04/02/16 1500)     Time Spent with Patient: 15 (04/02/16 1500)

## 2016-04-06 ENCOUNTER — Other Ambulatory Visit: Payer: Self-pay | Admitting: General Surgery

## 2016-04-06 DIAGNOSIS — C50412 Malignant neoplasm of upper-outer quadrant of left female breast: Secondary | ICD-10-CM

## 2016-04-13 ENCOUNTER — Other Ambulatory Visit: Payer: Self-pay | Admitting: General Surgery

## 2016-04-13 DIAGNOSIS — C50412 Malignant neoplasm of upper-outer quadrant of left female breast: Secondary | ICD-10-CM

## 2016-04-17 ENCOUNTER — Encounter (HOSPITAL_BASED_OUTPATIENT_CLINIC_OR_DEPARTMENT_OTHER): Payer: Self-pay | Admitting: *Deleted

## 2016-04-21 ENCOUNTER — Encounter: Payer: Self-pay | Admitting: Internal Medicine

## 2016-04-21 ENCOUNTER — Other Ambulatory Visit: Payer: Self-pay | Admitting: General Surgery

## 2016-04-21 DIAGNOSIS — C50412 Malignant neoplasm of upper-outer quadrant of left female breast: Secondary | ICD-10-CM

## 2016-04-21 NOTE — H&P (Signed)
Teresa Boyd Location: [facility] Patient #: [id number] DOB: 19-Mar-1931 Widowed / Language: Undefined / Race: White Female      History of Present Illness      This is a pleasant 82 year old Caucasian female who returns for further discussion and definitive planning for her surgery for her left breast cancer. Dr. Gunnar Bulla Magrinat is her medical oncologist. She will be seeing [name] in August after her surgery.      She originally presented with a palpable mass in the left breast and imaging studies showed bilateral abnormalities. In the left breast was a 2.5 cm spiculated mass at 12 o'clock position, 4 cm from the nipple. It feels larger. Left axillary ultrasound was negative. In the right breast there was 8 mm spiculated mass at the 11 o'clock position 5 cm from the nipple     Imaging image guided biopsy of the 2 right breast masses showed fibrocystic change and was felt to be concordant. Image guided biopsy of the palpable left breast mass shows invasive ductal carcinoma, estrogen receptor weakly positive.     Comorbidities include colon Resection by me 1994 with adjuvant chemotherapy by Dr. Beryle Beams. No recurrence. She is a widow. Son is a pulmonologist in [place] and had I have talked with him at least twice including today. He listened in and was part of our conversation today. 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 She is a widow since 40. Denies alcohol or tobacco. Her biggest concern is getting back to work as quick as possible after the surgery.       We've had a long conversation with her and other family members. She has seen Dr. Jana Hakim who states she does not need a sentinel node biopsy. Mastectomy without radiation therapy is an option. Lumpectomy with radiation therapy is an option. She knows that lumpectomy will result in a significant volume loss which may be noticeable or may not. She says this  doesn't bother her and she would prefer to have the lumpectomy such he can get back to work. She says if asymmetry is a problem she will deal with that later but she doesn't think so.      We have decided to go ahead and schedule her for left breast lumpectomy. We going to do double wire localization. I have requested a medial and a lateral wire to bracket the large cancer and to have these placed from the cephalad to- caudad orientation and we'll plan a transverse curvilinear incision in the upper breast and try to do some tissue transfer to get the best result we can. She knows that we may or may not get negative margins that she may have to have further surgery. We're hoping for negative margins obviously. She doesn't this is the biggest issue in her surgical care. I spent essentially 45 minutes discussing this with her and her son and reviewing all of her old records and going through her options.      She'll be scheduled for left breast lumpectomy with double wire localization. She will not need a sentinel node biopsy. I discussed the indications, details, techniques, and numerous risk of this with her. She is aware the risks of bleeding, infection, cosmetic deformity, reoperation for positive margins, nerve damage with chronic pain, cardiac pulmonary thromboembolic problems. She understands these issues well. At this time all questions are answered. She agrees with this plan.   Allergies  Clindamycin HCl *CHEMICALS* PenicillAMINE *ASSORTED CLASSES* Estradiol *CHEMICALS* Diatrizoate  Meglumine & Sodium *DIAGNOSTIC PRODUCTS* Celecoxib *ANALGESICS - ANTI-INFLAMMATORY*  Medication History  Calcitonin ([place]) (200UNIT/ACT Solution, Nasal) Active. Pataday (0.2% Solution, Ophthalmic) Active. Medications Reconciled  Vitals  Weight: 137 lb Height: 64in Body Surface Area: 1.67 m Body Mass Index: 23.52 kg/m  Temp.: 46F(Temporal)  Pulse: 76 (Regular)  BP: 124/74  (Sitting, Left Arm, Standard)    Physical Exam  General Mental Status-Alert. General Appearance-Not in acute distress. Build & Nutrition-Well nourished. Posture-Normal posture. Gait-Normal.  Head and Neck Head-normocephalic, atraumatic with no lesions or palpable masses. Trachea-midline. Thyroid Gland Characteristics - normal size and consistency and no palpable nodules.  Chest and Lung Exam Chest and lung exam reveals -on auscultation, normal breath sounds, no adventitious sounds and normal vocal resonance.  Breast Note: Breasts are medium sized, not small. Skin healthy. Nipple and areola complex normal. In the left breast at 12:00 there is a 3.5 cm mass which is quite obvious. This is quite mobile. It is not involving the nipple or areola. It does not involve the chest wall. There is no axillary adenopathy on either side. The right breast exam is unremarkable. Minimal bruising from her biopsies have essentially resolved.   Cardiovascular Cardiovascular examination reveals -normal heart sounds, regular rate and rhythm with no murmurs and femoral artery auscultation bilaterally reveals normal pulses, no bruits, no thrills.  Abdomen Note: Abdomen soft and nontender. No organomegaly. No mass. Lower midline scar well-healed. Vertical scar right paramedian lower abdomen. No hernias or masses. No tenderness. Nondistended   Neurologic Neurologic evaluation reveals -alert and oriented x 3 with no impairment of recent or remote memory, normal attention span and ability to concentrate, normal sensation and normal coordination.  Musculoskeletal Normal Exam - Bilateral-Upper Extremity Strength Normal and Lower Extremity Strength Normal.    Assessment & Plan PRIMARY CANCER OF UPPER OUTER QUADRANT OF LEFT FEMALE BREAST (C50.412)       You have seen Dr. Jana Hakim and he recommends lumpectomy without any lymph node biopsies. The most important thing surgically is  to completely remove the tumor from her breast and had a negative margin, as we discussed.      Since her tumor is relatively large we will have to make a transverse incision in the upper half of her breast and there will certainly be some volume loss. You have told as this is acceptable.      The day of the surgery you will first go to radiology to have them put 2 wires in your left breast, one to define the medial and one to define the lateral extent of the tumor. [facility] you should be able to go home the same day.  We have discussed the techniques, indications, and numerous risk of the surgery in detail. We will do everything humanly possible to coordinate with your son's work schedule.  HISTORY OF COLON CANCER (Z85.038) FAMILY HISTORY OF COLON CANCER (Z80.0) FIBROCYSTIC BREAST DISEASE, RIGHT (N60.11) LARGE MASS OF LEFT BREAST (N63)    Edsel Petrin. Dalbert Batman, M.D., [facility] General and Minimally invasive Surgery Breast and Colorectal Surgery Office:   [phone] Pager:   [phone]

## 2016-04-22 ENCOUNTER — Encounter (HOSPITAL_BASED_OUTPATIENT_CLINIC_OR_DEPARTMENT_OTHER): Payer: Self-pay | Admitting: *Deleted

## 2016-04-22 ENCOUNTER — Ambulatory Visit: Payer: Medicare Other | Admitting: Radiation Oncology

## 2016-04-22 ENCOUNTER — Ambulatory Visit
Admission: RE | Admit: 2016-04-22 | Discharge: 2016-04-22 | Disposition: A | Discharge status: Home / Self Care | Payer: Medicare Other | Source: Ambulatory Visit | Attending: General Surgery | Admitting: General Surgery

## 2016-04-22 ENCOUNTER — Ambulatory Visit (HOSPITAL_BASED_OUTPATIENT_CLINIC_OR_DEPARTMENT_OTHER)
Admission: RE | Admit: 2016-04-22 | Discharge: 2016-04-22 | Disposition: A | Discharge status: Home / Self Care | Payer: Medicare Other | Source: Ambulatory Visit | Attending: General Surgery | Admitting: General Surgery

## 2016-04-22 ENCOUNTER — Encounter (HOSPITAL_BASED_OUTPATIENT_CLINIC_OR_DEPARTMENT_OTHER)
Admission: RE | Disposition: A | Discharge status: Home / Self Care | Payer: Self-pay | Source: Ambulatory Visit | Attending: General Surgery

## 2016-04-22 ENCOUNTER — Ambulatory Visit (HOSPITAL_BASED_OUTPATIENT_CLINIC_OR_DEPARTMENT_OTHER): Payer: Medicare Other | Admitting: Anesthesiology

## 2016-04-22 DIAGNOSIS — Z170 Estrogen receptor positive status [ER+]: Secondary | ICD-10-CM | POA: Insufficient documentation

## 2016-04-22 DIAGNOSIS — Z85038 Personal history of other malignant neoplasm of large intestine: Secondary | ICD-10-CM | POA: Insufficient documentation

## 2016-04-22 DIAGNOSIS — C50412 Malignant neoplasm of upper-outer quadrant of left female breast: Secondary | ICD-10-CM

## 2016-04-22 DIAGNOSIS — Z79899 Other long term (current) drug therapy: Secondary | ICD-10-CM | POA: Insufficient documentation

## 2016-04-22 DIAGNOSIS — D0512 Intraductal carcinoma in situ of left breast: Secondary | ICD-10-CM | POA: Diagnosis not present

## 2016-04-22 DIAGNOSIS — N6011 Diffuse cystic mastopathy of right breast: Secondary | ICD-10-CM | POA: Insufficient documentation

## 2016-04-22 HISTORY — DX: Malignant neoplasm of unspecified site of unspecified female breast: C50.919

## 2016-04-22 HISTORY — PX: PARTIAL MASTECTOMY WITH NEEDLE LOCALIZATION: SHX6008

## 2016-04-22 HISTORY — DX: Asymptomatic varicose veins of unspecified lower extremity: I83.90

## 2016-04-22 SURGERY — PARTIAL MASTECTOMY WITH NEEDLE LOCALIZATION
Anesthesia: Regional | Site: Breast | Laterality: Left

## 2016-04-22 MED ORDER — HYDROCODONE-ACETAMINOPHEN 5-325 MG PO TABS: 1.0000 | ORAL_TABLET | Freq: Four times a day (QID) | ORAL | 0 refills | Status: DC | PRN

## 2016-04-22 MED ORDER — CHLORHEXIDINE GLUCONATE CLOTH 2 % EX PADS: 6.0000 | MEDICATED_PAD | Freq: Once | CUTANEOUS | Status: DC

## 2016-04-22 MED ORDER — BUPIVACAINE-EPINEPHRINE (PF) 0.5% -1:200000 IJ SOLN
INTRAMUSCULAR | Status: AC
  Filled 2016-04-22: qty 30

## 2016-04-22 MED ORDER — PHENYLEPHRINE 40 MCG/ML (10ML) SYRINGE FOR IV PUSH (FOR BLOOD PRESSURE SUPPORT)
PREFILLED_SYRINGE | INTRAVENOUS | Status: DC | PRN
  Administered 2016-04-22 (×3): 80 ug via INTRAVENOUS
  Administered 2016-04-22: 40 ug via INTRAVENOUS
  Administered 2016-04-22: 80 ug via INTRAVENOUS

## 2016-04-22 MED ORDER — LIDOCAINE 2% (20 MG/ML) 5 ML SYRINGE
INTRAMUSCULAR | Status: DC | PRN
  Administered 2016-04-22: 60 mg via INTRAVENOUS

## 2016-04-22 MED ORDER — BUPIVACAINE-EPINEPHRINE 0.5% -1:200000 IJ SOLN
INTRAMUSCULAR | Status: DC | PRN
  Administered 2016-04-22: 10 mL

## 2016-04-22 MED ORDER — SODIUM BICARBONATE 4 % IV SOLN
INTRAVENOUS | Status: AC
  Filled 2016-04-22: qty 5

## 2016-04-22 MED ORDER — MIDAZOLAM HCL 2 MG/2ML IJ SOLN
INTRAMUSCULAR | Status: AC
  Filled 2016-04-22: qty 2

## 2016-04-22 MED ORDER — FENTANYL CITRATE (PF) 100 MCG/2ML IJ SOLN
50.0000 ug | INTRAMUSCULAR | Status: DC | PRN
  Administered 2016-04-22: 50 ug via INTRAVENOUS

## 2016-04-22 MED ORDER — GABAPENTIN 300 MG PO CAPS
ORAL_CAPSULE | ORAL | Status: AC
  Filled 2016-04-22: qty 1

## 2016-04-22 MED ORDER — DEXAMETHASONE SODIUM PHOSPHATE 4 MG/ML IJ SOLN
INTRAMUSCULAR | Status: DC | PRN
  Administered 2016-04-22: 5 mg via INTRAVENOUS

## 2016-04-22 MED ORDER — ONDANSETRON HCL 4 MG/2ML IJ SOLN: 4.0000 mg | Freq: Once | INTRAMUSCULAR | Status: DC | PRN

## 2016-04-22 MED ORDER — ACETAMINOPHEN 500 MG PO TABS
ORAL_TABLET | ORAL | Status: AC
  Filled 2016-04-22: qty 5

## 2016-04-22 MED ORDER — CEFAZOLIN SODIUM-DEXTROSE 2-3 GM-% IV SOLR
INTRAVENOUS | Status: DC | PRN
  Administered 2016-04-22: 2 g via INTRAVENOUS

## 2016-04-22 MED ORDER — LACTATED RINGERS IV SOLN
INTRAVENOUS | Status: DC
  Administered 2016-04-22 (×3): via INTRAVENOUS

## 2016-04-22 MED ORDER — GLYCOPYRROLATE 0.2 MG/ML IJ SOLN: 0.2000 mg | Freq: Once | INTRAMUSCULAR | Status: DC | PRN

## 2016-04-22 MED ORDER — FENTANYL CITRATE (PF) 100 MCG/2ML IJ SOLN
25.0000 ug | INTRAMUSCULAR | Status: DC | PRN
  Administered 2016-04-22 (×2): 25 ug via INTRAVENOUS

## 2016-04-22 MED ORDER — ONDANSETRON HCL 4 MG/2ML IJ SOLN
INTRAMUSCULAR | Status: DC | PRN
  Administered 2016-04-22: 4 mg via INTRAVENOUS

## 2016-04-22 MED ORDER — GLYCOPYRROLATE 0.2 MG/ML IV SOSY
PREFILLED_SYRINGE | INTRAVENOUS | Status: AC
  Filled 2016-04-22: qty 3

## 2016-04-22 MED ORDER — SCOPOLAMINE 1 MG/3DAYS TD PT72: 1.0000 | MEDICATED_PATCH | Freq: Once | TRANSDERMAL | Status: DC | PRN

## 2016-04-22 MED ORDER — PROPOFOL 10 MG/ML IV BOLUS
INTRAVENOUS | Status: DC | PRN
  Administered 2016-04-22: 150 mg via INTRAVENOUS

## 2016-04-22 MED ORDER — PHENYLEPHRINE 40 MCG/ML (10ML) SYRINGE FOR IV PUSH (FOR BLOOD PRESSURE SUPPORT)
PREFILLED_SYRINGE | INTRAVENOUS | Status: AC
  Filled 2016-04-22: qty 10

## 2016-04-22 MED ORDER — DEXAMETHASONE SODIUM PHOSPHATE 10 MG/ML IJ SOLN
INTRAMUSCULAR | Status: AC
  Filled 2016-04-22: qty 1

## 2016-04-22 MED ORDER — FENTANYL CITRATE (PF) 100 MCG/2ML IJ SOLN
INTRAMUSCULAR | Status: AC
  Filled 2016-04-22: qty 2

## 2016-04-22 MED ORDER — GABAPENTIN 300 MG PO CAPS
300.0000 mg | ORAL_CAPSULE | ORAL | Status: AC
  Administered 2016-04-22: 300 mg via ORAL

## 2016-04-22 MED ORDER — MIDAZOLAM HCL 2 MG/2ML IJ SOLN: 1.0000 mg | INTRAMUSCULAR | Status: DC | PRN

## 2016-04-22 MED ORDER — ACETAMINOPHEN 500 MG PO TABS
1000.0000 mg | ORAL_TABLET | ORAL | Status: AC
  Administered 2016-04-22: 1000 mg via ORAL

## 2016-04-22 MED ORDER — GLYCOPYRROLATE 0.2 MG/ML IV SOSY
PREFILLED_SYRINGE | INTRAVENOUS | Status: DC | PRN
  Administered 2016-04-22: .2 mg via INTRAVENOUS

## 2016-04-22 MED ORDER — ONDANSETRON HCL 4 MG/2ML IJ SOLN
INTRAMUSCULAR | Status: AC
  Filled 2016-04-22: qty 2

## 2016-04-22 MED ORDER — LIDOCAINE HCL (PF) 1 % IJ SOLN
INTRAMUSCULAR | Status: AC
  Filled 2016-04-22: qty 30

## 2016-04-22 SURGICAL SUPPLY — 67 items
ADH SKN CLS APL DERMABOND .7 (GAUZE/BANDAGES/DRESSINGS)
APL SKNCLS STERI-STRIP NONHPOA (GAUZE/BANDAGES/DRESSINGS)
APPLIER CLIP 9.375 MED OPEN (MISCELLANEOUS)
APR CLP MED 9.3 20 MLT OPN (MISCELLANEOUS)
BANDAGE ACE 6X5 VEL STRL LF (GAUZE/BANDAGES/DRESSINGS) IMPLANT
BENZOIN TINCTURE PRP APPL 2/3 (GAUZE/BANDAGES/DRESSINGS) IMPLANT
BINDER BREAST LRG (GAUZE/BANDAGES/DRESSINGS) IMPLANT
BINDER BREAST MEDIUM (GAUZE/BANDAGES/DRESSINGS) ×1 IMPLANT
BINDER BREAST XLRG (GAUZE/BANDAGES/DRESSINGS) IMPLANT
BINDER BREAST XXLRG (GAUZE/BANDAGES/DRESSINGS) IMPLANT
BLADE HEX COATED 2.75 (ELECTRODE) ×2 IMPLANT
BLADE SURG 15 STRL LF DISP TIS (BLADE) ×2 IMPLANT
BLADE SURG 15 STRL SS (BLADE) ×4
CANISTER SUCT 1200ML W/VALVE (MISCELLANEOUS) ×2 IMPLANT
CHLORAPREP W/TINT 26ML (MISCELLANEOUS) ×2 IMPLANT
CLIP APPLIE 9.375 MED OPEN (MISCELLANEOUS) IMPLANT
COVER BACK TABLE 60X90IN (DRAPES) ×2 IMPLANT
COVER MAYO STAND STRL (DRAPES) ×2 IMPLANT
DECANTER SPIKE VIAL GLASS SM (MISCELLANEOUS) IMPLANT
DERMABOND ADVANCED (GAUZE/BANDAGES/DRESSINGS)
DERMABOND ADVANCED .7 DNX12 (GAUZE/BANDAGES/DRESSINGS) IMPLANT
DEVICE DUBIN W/COMP PLATE 8390 (MISCELLANEOUS) ×1 IMPLANT
DRAPE LAPAROSCOPIC ABDOMINAL (DRAPES) IMPLANT
DRAPE LAPAROTOMY 100X72 PEDS (DRAPES) ×2 IMPLANT
DRAPE LAPAROTOMY TRNSV 102X78 (DRAPE) IMPLANT
DRAPE UTILITY XL STRL (DRAPES) ×2 IMPLANT
DRSG PAD ABDOMINAL 8X10 ST (GAUZE/BANDAGES/DRESSINGS) ×2 IMPLANT
ELECT REM PT RETURN 9FT ADLT (ELECTROSURGICAL) ×2
ELECTRODE REM PT RTRN 9FT ADLT (ELECTROSURGICAL) ×1 IMPLANT
GAUZE SPONGE 4X4 16PLY XRAY LF (GAUZE/BANDAGES/DRESSINGS) IMPLANT
GLOVE BIO SURGEON STRL SZ 6.5 (GLOVE) ×1 IMPLANT
GLOVE BIOGEL PI IND STRL 7.0 (GLOVE) IMPLANT
GLOVE BIOGEL PI IND STRL 8 (GLOVE) IMPLANT
GLOVE BIOGEL PI INDICATOR 7.0 (GLOVE) ×1
GLOVE BIOGEL PI INDICATOR 8 (GLOVE) ×1
GLOVE EUDERMIC 7 POWDERFREE (GLOVE) ×2 IMPLANT
GOWN STRL REUS W/ TWL LRG LVL3 (GOWN DISPOSABLE) ×1 IMPLANT
GOWN STRL REUS W/ TWL XL LVL3 (GOWN DISPOSABLE) ×1 IMPLANT
GOWN STRL REUS W/TWL LRG LVL3 (GOWN DISPOSABLE) ×2
GOWN STRL REUS W/TWL XL LVL3 (GOWN DISPOSABLE) ×2
ILLUMINATOR WAVEGUIDE N/F (MISCELLANEOUS) IMPLANT
KIT MARKER MARGIN INK (KITS) ×1 IMPLANT
LIGHT WAVEGUIDE WIDE FLAT (MISCELLANEOUS) IMPLANT
NDL HYPO 25X1 1.5 SAFETY (NEEDLE) ×1 IMPLANT
NEEDLE HYPO 22GX1.5 SAFETY (NEEDLE) IMPLANT
NEEDLE HYPO 25X1 1.5 SAFETY (NEEDLE) ×2 IMPLANT
NS IRRIG 1000ML POUR BTL (IV SOLUTION) ×2 IMPLANT
PACK BASIN DAY SURGERY FS (CUSTOM PROCEDURE TRAY) ×2 IMPLANT
PENCIL BUTTON HOLSTER BLD 10FT (ELECTRODE) ×2 IMPLANT
SLEEVE SCD COMPRESS KNEE MED (MISCELLANEOUS) IMPLANT
SPONGE GAUZE 4X4 12PLY STER LF (GAUZE/BANDAGES/DRESSINGS) IMPLANT
SPONGE LAP 4X18 X RAY DECT (DISPOSABLE) ×3 IMPLANT
STRIP CLOSURE SKIN 1/2X4 (GAUZE/BANDAGES/DRESSINGS) IMPLANT
SUT ETHILON 4 0 PS 2 18 (SUTURE) IMPLANT
SUT MNCRL AB 4-0 PS2 18 (SUTURE) ×2 IMPLANT
SUT SILK 2 0 SH (SUTURE) ×2 IMPLANT
SUT VIC AB 2-0 SH 27 (SUTURE) ×4
SUT VIC AB 2-0 SH 27XBRD (SUTURE) IMPLANT
SUT VIC AB 4-0 P-3 18XBRD (SUTURE) IMPLANT
SUT VIC AB 4-0 P3 18 (SUTURE)
SUT VICRYL 3-0 CR8 SH (SUTURE) ×2 IMPLANT
SYR BULB 3OZ (MISCELLANEOUS) IMPLANT
SYRINGE 10CC LL (SYRINGE) ×2 IMPLANT
TAPE HYPAFIX 4 X10 (GAUZE/BANDAGES/DRESSINGS) IMPLANT
TOWEL OR NON WOVEN STRL DISP B (DISPOSABLE) ×2 IMPLANT
TUBE CONNECTING 20X1/4 (TUBING) ×2 IMPLANT
YANKAUER SUCT BULB TIP NO VENT (SUCTIONS) ×2 IMPLANT

## 2016-04-22 NOTE — Anesthesia Procedure Notes (Signed)
Procedure Name: LMA Insertion Date/Time: 04/22/2016 1:16 PM Performed by: Lyndee Leo Pre-anesthesia Checklist: Patient identified, Emergency Drugs available, Suction available and Patient being monitored Patient Re-evaluated:Patient Re-evaluated prior to inductionOxygen Delivery Method: Circle system utilized Preoxygenation: Pre-oxygenation with 100% oxygen Intubation Type: IV induction Ventilation: Mask ventilation without difficulty LMA: LMA inserted LMA Size: 4.0 Number of attempts: 1 Airway Equipment and Method: Bite block Placement Confirmation: positive ETCO2 Tube secured with: Tape Dental Injury: Teeth and Oropharynx as per pre-operative assessment

## 2016-04-22 NOTE — Op Note (Signed)
Patient Name:           Teresa Boyd   Date of Surgery:        04/22/2016  Pre op Diagnosis:      Invasive ductal cancer left breast, central  Post op Diagnosis:    same  Procedure:          Left breast partial mastectomy with double wire locallization,  reexcision lateral margin,  reexcision inferior margin,  adjacent tissue transfer to 10 cm transverse, 7 cm vertical, 4 cm deep.         Surgeon:                     Edsel Petrin. Dalbert Batman, M.D., FACS  Assistant:                      OR staff  Operative Indications:   This is a pleasant 82 year old Caucasian female who returns for further discussion and definitive planning for her surgery for her left breast cancer. Dr. Gunnar Bulla Magrinat is her medical oncologist. She will be seeing [name] in August after her surgery.      She originally presented with a palpable mass in the left breast and imaging studies showed bilateral abnormalities. In the left breast was a 2.5 cm spiculated mass at 12 o'clock position, 4 cm from the nipple. It feels larger. Left axillary ultrasound was negative.      In the right breast there was 8 mm spiculated mass at the 11 o'clock position 5 cm from the nipple      Imaging image guided biopsy of the 2 right breast masses showed fibrocystic change and was felt to be concordant. Image guided biopsy of the palpable left breast mass shows invasive ductal carcinoma, estrogen receptor weakly positive.     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  Her biggest concern is getting back to work as quick as possible after the surgery.       We've had a long conversation with her and other family members. She has seen Dr. Jana Hakim who states she does not need a sentinel node biopsy. Mastectomy without radiation therapy is an option. Lumpectomy with radiation therapy is an option. She knows that lumpectomy will result in a significant volume loss which may be noticeable or may  not. She says this doesn't bother her and she would prefer to have the lumpectomy such he can get back to work. She says if asymmetry is a problem she will deal with that later but she doesn't think so.      We have decided to go ahead and schedule her for left breast lumpectomy. We going to do double wire localization. I have requested a medial and a lateral wire to bracket the large cancer and to have these placed from the cephalad to- caudad orientation and we'll plan a transverse curvilinear incision in the upper breast and try to do some tissue transfer to get the best result we can. She knows that we may or may not get negative margins that she may have to have further surgery. We're hoping for negative margins obviously.       She'll be scheduled for left breast lumpectomy with double wire localization. She will not need a sentinel node biopsy.Marland Kitchen  Operative Findings:       The localizing wires were placed from a craniocaudal direction bracketing the tumor.  By palpation the tumor appears to  feel 4-5 cm in diameter.  The transverse curvilinear incision was 12 cm long.  I took the dissection all the way down to the pectoralis fascia and so the posterior margin is  broadly the pectoralis muscle.  It felt like I got all the way around the tumor adequately but the tissues were little bit thickened inferiorly and laterally so I re-excised the inferior and lateral margins.  The tissue defect was 10 cm wide by 7 cm vertical by 4 cm deep.  I did adjacent tissue transfer by extensively undermining the breast tissue off the pectoral pectoralis muscle and sliding the tissues together.  This helped reconstruct the breast breast mound somewhat.  The nipple and areolar complex was flattened and somewhat depressed.  Procedure in Detail:          Following the induction of general LMA anesthesia the patient's left breast was prepped and draped in a sterile fashion.  Surgical timeout was performed and intravenous  antibiotics were given.  0.5% Marcaine with epinephrine was used as local infiltration anesthetic.  2 wires were observed.  I drew the transverse incision.  The incision was made and I dissected down into the breast tissue as widely as possible around the palpable tumor and the wires.  The specimen was removed and marked with silk sutures and a 6 color ink kit to orient the pathologist.  The specimen mammogram looked good and it appeared that I was all the way around the tumor, at least radiographically.         I reexcised the inferior margin and I reexcised the lateral margin and re-inked these specimens and sent them  as separate specimens.         Hemostasis was excellent and achieved electrocautery.  I undermined the breast tissues inferiorly and superiorly so I could slide these together.  The lumpectomy cavity was marked with 5 metal clips and the cardinal positions.  I closed the deeper layers by transferring the tissue together with 2-0 Vicryl sutures.  The more superficial breast tissues were reapproximated with multiple 2-0 and 3-0 Vicryl's.  The skin was closed with a running subcutaneous taken of 4-0 Monocryl and Dermabond.  Breast binder was placed and the patient taken to PACU in stable condition.  EBL 20 mL.  Counts correct.  Complications none.     Edsel Petrin. Dalbert Batman, M.D., FACS General and Minimally Invasive Surgery Breast and Colorectal Surgery  04/22/2016 2:23 PM

## 2016-04-22 NOTE — Progress Notes (Signed)
Boost drink given with instructions to complete by 0830, verbalized understanding.

## 2016-04-22 NOTE — Transfer of Care (Signed)
Immediate Anesthesia Transfer of Care Note  Patient: Teresa Boyd  Procedure(s) Performed: Procedure(s): LEFT PARTIAL MASTECTOMY WITH DOUBLE NEEDLE LOCALIZATION REEXCISION INFERIOR AND LATERAL MARGIN ADJACENT TISSUE TRANSFER (Left)  Patient Location: PACU  Anesthesia Type:General  Level of Consciousness: awake, sedated and patient cooperative  Airway & Oxygen Therapy: Patient Spontanous Breathing and Patient connected to face mask oxygen  Post-op Assessment: Report given to RN and Post -op Vital signs reviewed and stable  Post vital signs: Reviewed and stable  Last Vitals:  Vitals:   04/22/16 1034  BP: 136/88  Pulse: 64  Resp: 20  Temp: 36.6 C    Last Pain:  Vitals:   04/22/16 1034  TempSrc: Oral         Complications: No apparent anesthesia complications

## 2016-04-22 NOTE — Anesthesia Postprocedure Evaluation (Signed)
Anesthesia Post Note  Patient: Teresa Boyd  Procedure(s) Performed: Procedure(s) (LRB): LEFT PARTIAL MASTECTOMY WITH DOUBLE NEEDLE LOCALIZATION REEXCISION INFERIOR AND LATERAL MARGIN ADJACENT TISSUE TRANSFER (Left)  Patient location during evaluation: PACU Anesthesia Type: General Level of consciousness: awake and alert Pain management: pain level controlled Vital Signs Assessment: post-procedure vital signs reviewed and stable Respiratory status: spontaneous breathing, nonlabored ventilation, respiratory function stable and patient connected to nasal cannula oxygen Cardiovascular status: blood pressure returned to baseline and stable Postop Assessment: no signs of nausea or vomiting Anesthetic complications: no    Last Vitals:  Vitals:   04/22/16 1418 04/22/16 1445  BP: 130/69 127/70  Pulse: 77 65  Resp:  11  Temp:      Last Pain:  Vitals:   04/22/16 1445  TempSrc:   PainSc: 2                  Teresa Boyd

## 2016-04-22 NOTE — Interval H&P Note (Signed)
History and Physical Interval Note:  04/22/2016 11:39 AM  Teresa Boyd  has presented today for surgery, with the diagnosis of LEFT BREAST CANCER  The various methods of treatment have been discussed with the patient and family. After consideration of risks, benefits and other options for treatment, the patient has consented to  Procedure(s): LEFT PARTIAL MASTECTOMY WITH DOUBLE NEEDLE LOCALIZATION (Left) as a surgical intervention .  The patient's history has been reviewed, patient examined, no change in status, stable for surgery.  I have reviewed the patient's chart and labs.  Questions were answered to the patient's satisfaction.     Adin Hector

## 2016-04-22 NOTE — Discharge Instructions (Signed)
[place] Office Phone Number [phone]  BREAST BIOPSY/ PARTIAL MASTECTOMY: POST OP INSTRUCTIONS  Always review your discharge instruction sheet given to you by the facility where your surgery was performed.  IF YOU HAVE DISABILITY OR FAMILY LEAVE FORMS, YOU MUST BRING THEM TO THE OFFICE FOR PROCESSING.  DO NOT GIVE THEM TO YOUR DOCTOR.  1. A prescription for pain medication may be given to you upon discharge.  Take your pain medication as prescribed, if needed.  If narcotic pain medicine is not needed, then you may take acetaminophen (Tylenol) or ibuprofen (Advil) as needed. 2. Take your usually prescribed medications unless otherwise directed 3. If you need a refill on your pain medication, please contact your pharmacy.  They will contact our office to request authorization.  Prescriptions will not be filled after 5pm or on week-ends. 4. You should eat very light the first 24 hours after surgery, such as soup, crackers, pudding, etc.  Resume your normal diet the day after surgery. 5. Most patients will experience some swelling and bruising in the breast.  Ice packs and a good support bra will help.  Swelling and bruising can take several days to resolve.  6. It is common to experience some constipation if taking pain medication after surgery.  Increasing fluid intake and taking a stool softener will usually help or prevent this problem from occurring.  A mild laxative (Milk of Magnesia or Miralax) should be taken according to package directions if there are no bowel movements after 48 hours. 7. Unless discharge instructions indicate otherwise, you may remove your bandages 24-48 hours after surgery, and you may shower at that time.  You may have steri-strips (small skin tapes) in place directly over the incision.  These strips should be left on the skin for 7-10 days.  If your surgeon used skin glue on the incision, you may shower in 24 hours.  The glue will flake off over  the next 2-3 weeks.  Any sutures or staples will be removed at the office during your follow-up visit. 8. ACTIVITIES:  You may resume regular daily activities (gradually increasing) beginning the next day.  Wearing a good support bra or sports bra minimizes pain and swelling.  You may have sexual intercourse when it is comfortable. a. You may drive when you no longer are taking prescription pain medication, you can comfortably wear a seatbelt, and you can safely maneuver your car and apply brakes. b. RETURN TO WORK:  ______________________________________________________________________________________ 9. You should see your doctor in the office for a follow-up appointment approximately two weeks after your surgery.  Your doctors nurse will typically make your follow-up appointment when she calls you with your pathology report.  Expect your pathology report 2-3 business days after your surgery.  You may call to check if you do not hear from [place] after three days. 10. OTHER INSTRUCTIONS: _______________________________________________________________________________________________ _____________________________________________________________________________________________________________________________________ _____________________________________________________________________________________________________________________________________ _____________________________________________________________________________________________________________________________________  WHEN TO CALL YOUR DOCTOR: 1. Fever over 101.0 2. Nausea and/or vomiting. 3. Extreme swelling or bruising. 4. Continued bleeding from incision. 5. Increased pain, redness, or drainage from the incision.  The clinic staff is available to answer your questions during regular business hours.  Please dont hesitate to call and ask to speak to one of the nurses for clinical concerns.  If you have a medical emergency, go to the nearest  emergency room or call 911.  A surgeon from [facility] is always on call at the hospital.  For further questions, please visit [email or web address]  Post Anesthesia Home Care Instructions  Activity: Get plenty of rest for the remainder of the day. A responsible adult should stay with you for 24 hours following the procedure.  For the next 24 hours, DO NOT: -Drive a car -Paediatric nurse -Drink alcoholic beverages -Take any medication unless instructed by your physician -Make any legal decisions or sign important papers.  Meals: Start with liquid foods such as gelatin or soup. Progress to regular foods as tolerated. Avoid greasy, spicy, heavy foods. If nausea and/or vomiting occur, drink only clear liquids until the nausea and/or vomiting subsides. Call your physician if vomiting continues.  Special Instructions/Symptoms: Your throat may feel dry or sore from the anesthesia or the breathing tube placed in your throat during surgery. If this causes discomfort, gargle with warm salt water. The discomfort should disappear within 24 hours.  If you had a scopolamine patch placed behind your ear for the management of post- operative nausea and/or vomiting:  1. The medication in the patch is effective for 72 hours, after which it should be removed.  Wrap patch in a tissue and discard in the trash. Wash hands thoroughly with soap and water. 2. You may remove the patch earlier than 72 hours if you experience unpleasant side effects which may include dry mouth, dizziness or visual disturbances. 3. Avoid touching the patch. Wash your hands with soap and water after contact with the patch.     Post Anesthesia Home Care Instructions  Activity: Get plenty of rest for the remainder of the day. A responsible adult should stay with you for 24 hours following the procedure.  For the next 24 hours, DO NOT: -Drive a car -Paediatric nurse -Drink alcoholic  beverages -Take any medication unless instructed by your physician -Make any legal decisions or sign important papers.  Meals: Start with liquid foods such as gelatin or soup. Progress to regular foods as tolerated. Avoid greasy, spicy, heavy foods. If nausea and/or vomiting occur, drink only clear liquids until the nausea and/or vomiting subsides. Call your physician if vomiting continues.  Special Instructions/Symptoms: Your throat may feel dry or sore from the anesthesia or the breathing tube placed in your throat during surgery. If this causes discomfort, gargle with warm salt water. The discomfort should disappear within 24 hours.  If you had a scopolamine patch placed behind your ear for the management of post- operative nausea and/or vomiting:  1. The medication in the patch is effective for 72 hours, after which it should be removed.  Wrap patch in a tissue and discard in the trash. Wash hands thoroughly with soap and water. 2. You may remove the patch earlier than 72 hours if you experience unpleasant side effects which may include dry mouth, dizziness or visual disturbances. 3. Avoid touching the patch. Wash your hands with soap and water after contact with the patch.

## 2016-04-22 NOTE — Anesthesia Preprocedure Evaluation (Addendum)
Anesthesia Evaluation  Patient identified by MRN, date of birth, ID band Patient awake    Reviewed: Allergy & Precautions, H&P , NPO status , Patient's Chart, lab work & pertinent test results  History of Anesthesia Complications Negative for: history of anesthetic complications  Airway Mallampati: II  TM Distance: >3 FB Neck ROM: full    Dental no notable dental hx.    Pulmonary neg pulmonary ROS,    Pulmonary exam normal breath sounds clear to auscultation       Cardiovascular negative cardio ROS Normal cardiovascular exam Rhythm:regular Rate:Normal     Neuro/Psych  Neuromuscular disease    GI/Hepatic negative GI ROS, Neg liver ROS,   Endo/Other  negative endocrine ROS  Renal/GU negative Renal ROS     Musculoskeletal   Abdominal   Peds  Hematology negative hematology ROS (+)   Anesthesia Other Findings   Reproductive/Obstetrics negative OB ROS                             Anesthesia Physical Anesthesia Plan  ASA: II  Anesthesia Plan: General   Post-op Pain Management:    Induction: Intravenous  Airway Management Planned: LMA  Additional Equipment:   Intra-op Plan:   Post-operative Plan: Extubation in OR  Informed Consent: I have reviewed the patients History and Physical, chart, labs and discussed the procedure including the risks, benefits and alternatives for the proposed anesthesia with the patient or authorized representative who has indicated his/her understanding and acceptance.   Dental Advisory Given  Plan Discussed with: Anesthesiologist, CRNA and Surgeon  Anesthesia Plan Comments:        Anesthesia Quick Evaluation

## 2016-04-23 ENCOUNTER — Encounter (HOSPITAL_BASED_OUTPATIENT_CLINIC_OR_DEPARTMENT_OTHER): Payer: Self-pay | Admitting: General Surgery

## 2016-05-07 ENCOUNTER — Telehealth: Payer: Self-pay | Admitting: *Deleted

## 2016-05-07 NOTE — Telephone Encounter (Signed)
Ordered mammaprint per Dr. Jana Hakim. Faxed requisition to pathology and confirmed receipt.

## 2016-05-07 NOTE — Progress Notes (Signed)
Location of Breast Cancer: Left Breast  Histology per Pathology Report:  03/17/16 Diagnosis Breast, left, needle core biopsy, 12:00 o'clock mass - INVASIVE DUCTAL CARCINOMA, SEE COMMENT. - DUCTAL CARCINOMA IN SITU.  Receptor Status: ER(5%), PR (0%), Her2-neu (NEG), Ki-(5%)  04/22/16 Diagnosis 1. Breast, partial mastectomy, Left - INVASIVE DUCTAL CARCINOMA, GRADE III/III, SPANNING 3.8 CM. - DUCTAL CARCINOMA IN SITU, INTERMEDIATE GRADE. - LYMPHOVASCULAR INVASION IS IDENTIFIED. - THE SURGICAL RESECTION MARGINS ARE NEGATIVE FOR CARCINOMA. - SEE ONCOLOGY TABLE BELOW. 2. Breast, excision, Re-excision inferior margin - FIBROCYSTIC CHANGES. - THERE IS NO EVIDENCE OF MALIGNANCY. - SEE COMMENT. 3. Breast, excision, Re-excision lateral margin - FIBROCYSTIC CHANGES. - USUAL DUCTAL HYPERPLASIA. - THERE IS NO EVIDENCE OF MALIGNANCY.  Did patient present with symptoms or was this found on screening mammography?: She self palpated her mass, and went for a screening mammogram.   Past/Anticipated interventions by surgeon, if any: 04/22/16 Procedure:          Left breast partial mastectomy with double wire locallization,  reexcision lateral margin,  reexcision inferior margin,  adjacent tissue transfer to 10 cm transverse, 7 cm vertical, 4 cm deep.        Surgeon:                     Edsel Petrin. Dalbert Batman, M.D., [facility]  Past/Anticipated interventions by medical oncology, if any: 03/27/16 Dr. Jana Hakim  (1) definitive surgery pending (done 04/22/16) (2) consideration of adjuvant radiation depending on surgical results (3) consideration of adjuvant anti-estrogens  Next appointment is 05/27/16 with Dr. Jana Hakim.    Lymphedema issues, if any:  She denies, she has good arm mobility.   Pain issues, if any:  She denies.   SAFETY ISSUES:  Prior radiation? No  Pacemaker/ICD? No  Possible current pregnancy? No  Is the patient on methotrexate? No  Current Complaints / other details:  She denies any  other concerns at this time.     Ceola Para, Stephani Police, RN 05/07/2016,12:54 PM

## 2016-05-13 ENCOUNTER — Encounter: Payer: Self-pay | Admitting: Radiation Oncology

## 2016-05-13 ENCOUNTER — Encounter: Payer: Self-pay | Admitting: Internal Medicine

## 2016-05-13 ENCOUNTER — Ambulatory Visit (INDEPENDENT_AMBULATORY_CARE_PROVIDER_SITE_OTHER): Payer: Medicare Other | Admitting: Internal Medicine

## 2016-05-13 ENCOUNTER — Ambulatory Visit
Admission: RE | Admit: 2016-05-13 | Discharge: 2016-05-13 | Disposition: A | Discharge status: Home / Self Care | Payer: Medicare Other | Source: Ambulatory Visit | Attending: Radiation Oncology | Admitting: Radiation Oncology

## 2016-05-13 VITALS — BP 134/70 | HR 68 | Ht 63.0 in | Wt 137.1 lb

## 2016-05-13 VITALS — BP 127/78 | HR 68 | Temp 98.4°F | Ht 63.0 in | Wt 138.2 lb

## 2016-05-13 DIAGNOSIS — Z8371 Family history of colonic polyps: Secondary | ICD-10-CM | POA: Insufficient documentation

## 2016-05-13 DIAGNOSIS — C50412 Malignant neoplasm of upper-outer quadrant of left female breast: Secondary | ICD-10-CM

## 2016-05-13 DIAGNOSIS — Z85038 Personal history of other malignant neoplasm of large intestine: Secondary | ICD-10-CM | POA: Insufficient documentation

## 2016-05-13 DIAGNOSIS — Z86010 Personal history of colonic polyps: Secondary | ICD-10-CM

## 2016-05-13 DIAGNOSIS — Z853 Personal history of malignant neoplasm of breast: Secondary | ICD-10-CM

## 2016-05-13 DIAGNOSIS — Z888 Allergy status to other drugs, medicaments and biological substances status: Secondary | ICD-10-CM | POA: Diagnosis not present

## 2016-05-13 DIAGNOSIS — Z510 Encounter for antineoplastic radiation therapy: Secondary | ICD-10-CM | POA: Diagnosis not present

## 2016-05-13 DIAGNOSIS — Z800 Family history of malignant neoplasm of digestive organs: Secondary | ICD-10-CM | POA: Insufficient documentation

## 2016-05-13 DIAGNOSIS — Z881 Allergy status to other antibiotic agents status: Secondary | ICD-10-CM | POA: Diagnosis not present

## 2016-05-13 DIAGNOSIS — Z880 Allergy status to penicillin: Secondary | ICD-10-CM | POA: Insufficient documentation

## 2016-05-13 DIAGNOSIS — Z79899 Other long term (current) drug therapy: Secondary | ICD-10-CM | POA: Insufficient documentation

## 2016-05-13 NOTE — Progress Notes (Signed)
Radiation Oncology         [phone] ________________________________  Initial Outpatient Consultation  Name: Teresa Boyd MRN: [id number]  Date: 05/13/2016  DOB: 06/08/1931  CC:IBARRA,MARIA, MD  Magrinat, Gustav C, MD   REFERRING PHYSICIAN: Magrinat, Gustav C, MD  DIAGNOSIS:    ICD-9-CM ICD-10-CM   1. Breast cancer of upper-outer quadrant of left female breast (HCC) 174.4 C50.412      Stage IIA left breast UOQ invasive ductal carcinoma (ER 5%, PR 0%, HER2 -) grade 3, pT2Nx  HISTORY OF PRESENT ILLNESS::Teresa Boyd is a 82 y.o. female who self palpated a mass in her left breast in mid-June 2017. She brought it to the attention of Dr. Bouska who ordered a screening mammogram. This showed possible abnormalities in both breasts. [facility] where mammography and ultrasonography showed in the RIGHT breast an 0.8 cm spiculated mass at the 11 o'clock position 5 cm from the nipple and a second area of altered architecture also in the upper outer quadrant. In the LEFT breast there was a 2.5 cm spiculated mass in the upper outer quadrant 4 cm from the nipple. Ultrasound of both axillae were negative per Dr. Magrinat's note; I do not see the radiology US report in EPIC.  On 03/17/16, biopsy of the left breast 12 o'clock mass revealed grade 2-3 invasive ductal carcinoma (ER 5% +, PR 0% -, HER2 -, Ki67 5%). Biopsy of the right breast 11 o'clock position revealed fibrocystic change and dense fibrosis with no malignancy identified. Biopsy of the right breast UOQ posterior depth mass also revealed fibrocystic change and no malignancy.  The patient presented to Dr. Magrinat on 03/27/16 who discussed treatment options.  The patient had a left partial mastectomy by Dr. Ingram revealed grade III invasive ductal carcinoma (spanning 3.8 cm) (pT2, pNX), intermediate grade DICS, and lymphovascular invasion. The surgical resection margins were negative for carcinoma.  MammaPrint was ordered per Dr. Magrinat on 05/07/16 and the results are pending.  The patient presents today to discuss the role of radiation for the management of her disease.  PREVIOUS RADIATION THERAPY: No  PAST MEDICAL HISTORY:  has a past medical history of Breast cancer (HCC) (2017); Colon cancer (HCC) (1994); adenomatous colonic polyps; and Varicose vein.    PAST SURGICAL HISTORY: Past Surgical History:  Procedure Laterality Date  . ABDOMINAL HYSTERECTOMY    . COLON RESECTION     sigmoid  . COLON SURGERY  1994   colon cancer  . COLONOSCOPY W/ POLYPECTOMY  last 02/18/11   multiple, prior colon cancer and polyps, 7 mm adenoma 2012  . OVARIAN CYST SURGERY    . PARTIAL MASTECTOMY WITH NEEDLE LOCALIZATION Left 04/22/2016   Procedure: LEFT PARTIAL MASTECTOMY WITH DOUBLE NEEDLE LOCALIZATION REEXCISION INFERIOR AND LATERAL MARGIN ADJACENT TISSUE TRANSFER;  Surgeon: Haywood Ingram, MD;  Location: [facility];  Service: General;  Laterality: Left;  . TONSILLECTOMY      FAMILY HISTORY: family history includes Colon cancer (age of onset: 85) in her mother; Colon polyps (age of onset: 84) in her father.  SOCIAL HISTORY:  reports that she has never smoked. She has never used smokeless tobacco. She reports that she does not drink alcohol or use drugs.  ALLERGIES: Sotradecol [sodium tetradecyl sulfate]; Celebrex [celecoxib]; Clindamycin; Conjugated estrogens; Estradiol benzoate; Estrogens conjugated; Hypaque-m [diatrizone sodium-diatrizone meglumine]; Penicillins; and Tramadol  MEDICATIONS:  Current Outpatient Prescriptions  Medication Sig Dispense Refill  . calcitonin, salmon, (MIACALCIN) 200   UNIT/ACT nasal spray 1 spray by Nasal route daily.      . Calcium Carbonate (CALCIUM-CARB 600 PO) Take by mouth.    . cetirizine (ZYRTEC) 10 MG tablet Take 10 mg by mouth daily.    . Cholecalciferol (VITAMIN D3) 1000 units CAPS Take by mouth.    . Grape Seed Extract 100 MG CAPS Take 400  each by mouth.    . Multiple Vitamin (MULTIVITAMIN) tablet Take 1 tablet by mouth daily.      . Omega-3 Fatty Acids (FISH OIL) 1000 MG CPDR Take by mouth.    . HYDROcodone-acetaminophen (NORCO) 5-325 MG tablet Take 1-2 tablets by mouth every 6 (six) hours as needed. (Patient not taking: Reported on 05/13/2016) 30 tablet 0  . ondansetron (ZOFRAN-ODT) 4 MG disintegrating tablet      No current facility-administered medications for this encounter.     REVIEW OF SYSTEMS:  Notable for that above. The patient denies lymphedema issues and reports good arm mobility.   PHYSICAL EXAM:  height is 5' 3" (1.6 m) and weight is 138 lb 3.2 oz (62.7 kg). Her temperature is 98.4 F (36.9 C). Her blood pressure is 127/78 and her pulse is 68. Her oxygen saturation is 96%.   General: Alert and oriented, in no acute distress HEENT: Head is normocephalic. Extraocular movements are intact. Oropharynx is clear. Neck: Neck is supple, no palpable cervical or supraclavicular lymphadenopathy. Heart: Regular in rate and rhythm with no murmurs, rubs, or gallops. Chest: Clear to auscultation bilaterally, with no rhonchi, wheezes, or rales. Abdomen: Soft, nontender, nondistended, with no rigidity or guarding. Extremities: No cyanosis or edema. Lymphatics: see Neck Exam Skin: No concerning lesions. Musculoskeletal: ambulatory independently Neurologic: Cranial nerves II through XII are grossly intact. No obvious focalities. Speech is fluent. Coordination is intact. Psychiatric: Judgment and insight are intact. Affect is appropriate. Breast Exam: Large left breast lumpectomy scar that has glue and is still healing. The superior aspect of the left areola is inverted. No palpable masses appreciated in the axilla bilaterally or in the breasts.  ECOG = 1  0 - Asymptomatic (Fully active, able to carry on all predisease activities without restriction)  1 - Symptomatic but completely ambulatory (Restricted in physically  strenuous activity but ambulatory and able to carry out work of a light or sedentary nature. For example, light housework, office work)  2 - Symptomatic, <50% in bed during the day (Ambulatory and capable of all self care but unable to carry out any work activities. Up and about more than 50% of waking hours)  3 - Symptomatic, >50% in bed, but not bedbound (Capable of only limited self-care, confined to bed or chair 50% or more of waking hours)  4 - Bedbound (Completely disabled. Cannot carry on any self-care. Totally confined to bed or chair)  5 - Death   Oken MM, Creech RH, Tormey DC, et al. (1982). "Toxicity and response criteria of the Eastern Cooperative Oncology Group". Am. J. Clin. Oncol. 5 (6): 649-55   LABORATORY DATA:  Lab Results  Component Value Date   WBC 4.2 (L) 12/29/2006   HGB 13.3 12/29/2006   HCT 38.7 12/29/2006   MCV 92.1 12/29/2006   PLT 232 12/29/2006   CMP     Component Value Date/Time   NA 142 12/29/2006 1210   K 4.0 12/29/2006 1210   CL 107 12/29/2006 1210   CO2 32 12/29/2006 1210   GLUCOSE 93 12/29/2006 1210   BUN 13 12/29/2006 1210   CREATININE 0.7   12/29/2006 1210   CALCIUM 9.5 12/29/2006 1210   PROT 6.8 12/29/2006 1210   ALBUMIN 3.5 12/29/2006 1210   AST 22 12/29/2006 1210   ALT 19 12/29/2006 1210   ALKPHOS 56 12/29/2006 1210   BILITOT 1.0 12/29/2006 1210   GFRNONAA 87 12/29/2006 1210   GFRAA 105 12/29/2006 1210         RADIOGRAPHY: Mm Breast Surgical Specimen  Result Date: 04/22/2016 CLINICAL DATA:  Status post needle localization of a left breast malignancy. Postsurgical specimen image. EXAM: SPECIMEN RADIOGRAPH OF THE LEFT BREAST COMPARISON:  Previous exam(s). FINDINGS: Status post excision of the left breast. Both wire tips, the entire mass and biopsy marker clip are present and are marked for pathology. IMPRESSION: Specimen radiograph of the left breast. Electronically Signed   By: Lajean Manes M.D.   On: 04/22/2016 13:57   Mm Lt Plc  Breast Loc Dev   1st Lesion  Inc Mammo Guide  Result Date: 04/22/2016 CLINICAL DATA:  Patient presents for needle localization bracketing of a left breast carcinoma. Instructions were to place needles from the CC approach, 1 along the medial margin and the other along the lateral aspect of the mass. EXAM: NEEDLE LOCALIZATION OF THE LEFT BREAST WITH MAMMO GUIDANCE COMPARISON:  Previous exams. FINDINGS: Patient presents for needle localization prior to surgical excision. I met with the patient and we discussed the procedure of needle localization including benefits and alternatives. We discussed the high likelihood of a successful procedure. We discussed the risks of the procedure, including infection, bleeding, tissue injury, and further surgery. Informed, written consent was given. The usual time-out protocol was performed immediately prior to the procedure. Using mammographic guidance, sterile technique, 1% lidocaine and two 7 cm modified Kopans needles, the medial and the lateral margins of the mass were localized using CC approach. The images were marked for Dr. Renelda Loma. IMPRESSION: Needle localization the left breast. No apparent complications. Electronically Signed   By: Lajean Manes M.D.   On: 04/22/2016 08:44   Mm Lt Plc Breast Loc Dev   Ea Add Lesion  Inc Mammo Guide  Result Date: 04/22/2016 CLINICAL DATA:  Patient presents for needle localization bracketing of a left breast carcinoma. Instructions were to place needles from the CC approach, 1 along the medial margin and the other along the lateral aspect of the mass. EXAM: NEEDLE LOCALIZATION OF THE LEFT BREAST WITH MAMMO GUIDANCE COMPARISON:  Previous exams. FINDINGS: Patient presents for needle localization prior to surgical excision. I met with the patient and we discussed the procedure of needle localization including benefits and alternatives. We discussed the high likelihood of a successful procedure. We discussed the risks of the procedure,  including infection, bleeding, tissue injury, and further surgery. Informed, written consent was given. The usual time-out protocol was performed immediately prior to the procedure. Using mammographic guidance, sterile technique, 1% lidocaine and two 7 cm modified Kopans needles, the medial and the lateral margins of the mass were localized using CC approach. The images were marked for Dr. Renelda Loma. IMPRESSION: Needle localization the left breast. No apparent complications. Electronically Signed   By: Lajean Manes M.D.   On: 04/22/2016 08:44      IMPRESSION/PLAN: Stage IIA left breast cancer.  The patient is scheduled to follow up with Dr. Jana Hakim on 05/27/16. I will hold treatment planning until she is released by him. MammaPrint results are pending.  I would recommend she would receive approximately 4 weeks of radiation to the left breast and axilla. I  would treat her with high tangents and hypofractionated treatment. The high tangents due to lack of lymph node surgery and I believe hypofractionated treatment would be better tolerated given her advanced age.  We spoke about acute effects including skin irritation and fatigue as well as much less common late effects including lung and heart irritation. We spoke about the latest technology that is used to minimize the risk of late effects for breast cancer patients undergoing radiotherapy. No guarantees of treatment were given. The patient is enthusiastic about proceeding with treatment. I look forward to participating in the patient's care. Consent signed today. __________________________________________   [name], MD  This document serves as a record of services personally performed by [name], MD. It was created on her behalf by Darcus Austin, a trained medical scribe. The creation of this record is based on the scribe's personal observations and the provider's statements to them. This document has been checked and approved by the attending  provider.

## 2016-05-13 NOTE — Patient Instructions (Addendum)
Please give me a call when you are finished with your breast cancer treatment and we can sit down and talk about colonoscopy again.     I appreciate the opportunity to care for you. Silvano Rusk, MD, [facility]

## 2016-05-13 NOTE — Progress Notes (Signed)
   Subjective:    Patient ID: Teresa Boyd, female    DOB: 08/30/1931, 82 y.o.   MRN: [id number] Cc: f/u of colon cancer and hx polyps HPI Here to discuss f/u colonoscopy in setting of: hx colon cancer resected 25 yrs ago, hx subsequent adenomatous colon polyps last colonoscopy 2012 one small adenoma and she has recently been dx w/ breast ca s/p lumpectomy and awaiting XRT and medical Tx. No GI Sxs She is concerned about having colon polyps and having colon cancer again. Medications, allergies, past medical history, past surgical history, family history and social history are reviewed and updated in the EMR.  She still works in [employer].  Review of Systems As above    Objective:   Physical Exam BP 134/70 (BP Location: Left Arm, Patient Position: Sitting, Cuff Size: Normal)   Pulse 68   Ht 5\' 3"  (1.6 m) Comment: height measured without shoes  Wt 137 lb 2 oz (62.2 kg)   BMI 24.29 kg/m  NAD Younger than stated age  Reviewed recent surgery on breast/pathology and rad oncology notes and medical oncology notes    Assessment & Plan:   Encounter Diagnoses  Name Primary?  . Personal history of breast cancer Yes  . Hx of adenomatous colonic polyps   . Breast cancer of upper-outer quadrant of left female breast ([place])     I asked her to call back for an appt after she completes Tx of breast Ca - do not think appropriate for a surveillance colonoscopy right now and based upon age reasonable to stop. She is anxious about the hx CRCA and polyps and is very functional so repeating a colonoscopy may help her psyche significantly and worthwhile in that sense.  15 minutes time spent with patient > half in counseling coordination of care  [id number], MD Dr. Gunnar Bulla Magrinat

## 2016-05-19 ENCOUNTER — Telehealth: Payer: Self-pay | Admitting: *Deleted

## 2016-05-19 NOTE — Telephone Encounter (Signed)
Received Mammaprint result of - High Risk Gave copy to Dr. Jana Hakim for review. Physician team notified.

## 2016-05-22 ENCOUNTER — Other Ambulatory Visit: Payer: Self-pay | Admitting: Oncology

## 2016-05-27 ENCOUNTER — Ambulatory Visit (HOSPITAL_BASED_OUTPATIENT_CLINIC_OR_DEPARTMENT_OTHER): Payer: Medicare Other | Admitting: Oncology

## 2016-05-27 ENCOUNTER — Telehealth: Payer: Self-pay | Admitting: Oncology

## 2016-05-27 VITALS — BP 133/65 | HR 63 | Temp 98.4°F | Resp 18 | Ht 63.0 in | Wt 137.2 lb

## 2016-05-27 DIAGNOSIS — C50412 Malignant neoplasm of upper-outer quadrant of left female breast: Secondary | ICD-10-CM

## 2016-05-27 DIAGNOSIS — M8580 Other specified disorders of bone density and structure, unspecified site: Secondary | ICD-10-CM | POA: Diagnosis not present

## 2016-05-27 NOTE — Progress Notes (Signed)
[place]  Telephone:(336) [phone] Fax:(336) [phone]     ID: Teresa Boyd DOB: Jun 17, 1931  MR#: [id number]  TDH#:[id number]  Patient Care Team: De Nurse, MD as PCP - General (Family Medicine) [name], MD as Consulting Physician (General Surgery) [name], MD as Consulting Physician (Gastroenterology) Chauncey Cruel, MD as Consulting Physician (Oncology) [name], MD as Attending Physician (Radiation Oncology) [name], MD as Consulting Physician (Family Medicine) [name], MD as Consulting Physician (Obstetrics and Gynecology) [name], MD as Consulting Physician (Orthopedic Surgery) OTHER MD:  CHIEF COMPLAINT: Estrogen receptor positive invasive breast cancer  CURRENT TREATMENT: Considering chemotherapy, awaiting adjuvant radiation   BREAST CANCER HISTORY: From the original intake note:  Teresa Boyd herself palpated a mass in her left breast mid-June 2017. She brought it to the attention of Dr. Coletta Memos who obtained screening mammography showing possible abnormalities in both breasts. Lameshia was then referred to the [facility] where mammography and ultrasonography 03/18/2015 showed in the right breast an 8 mm spiculated mass at the 11:00 position 5 cm from the nipple and a second area of altered architecture also in the upper outer quadrant. In the left breast there was a 2.5 cm is spiculated mass in the upper outer quadrant 4 cm from the nipple. Ultrasound of both axillae were negative.  On 03/17/2016 the patient underwent biopsy of all 3 suspicious areas. In the right breast, both areas showed only fibrocystic change, with no evidence of malignancy ([id number] and [id number]). This was read as concordant by radiology.  In the left breast however the final pathology ([id number]) showed an invasive ductal carcinoma, grade 2 or 3, estrogen receptor 5% positive, with moderate staining intensity, progesterone receptor negative, with an  MIB-1 of 5%, and no HER-2 amplification, the signals ratio being 1.53 and the number per cell 3.45.  Teresa Boyd also has a remote history of colon cancer, in remission for more than 10 years with the most recent colonoscopy 2012 negative  INTERVAL HISTORY: Teresa Boyd returns today for follow-up of her breast cancer. Since her last visit here she underwent left lumpectomy on 04/22/2016. The final pathology ([id number]) showed an invasive ductal carcinoma, grade 3, measuring 3.8 cm. There was no sentinel lymph node sampling. Margins were clear.  We obtained a Mammaprint on this tumor and it returned high risk. She is here today to discuss results  REVIEW OF SYSTEMS: Teresa Boyd did well with the surgery, with no unusual fever, pain, or bleeding problems. Her only complaint is that the nipple contour is now distorted. A detailed review of systems today was otherwise stable  PAST MEDICAL HISTORY: Past Medical History:  Diagnosis Date  . Breast cancer ([facility]) 2017   left breast  . Colon cancer ([facility]) 1994  . Hx of adenomatous colonic polyps   . Varicose vein     PAST SURGICAL HISTORY: Past Surgical History:  Procedure Laterality Date  . ABDOMINAL HYSTERECTOMY    . COLON RESECTION     sigmoid  . COLON SURGERY  1994   colon cancer  . COLONOSCOPY W/ POLYPECTOMY  last 02/18/11   multiple, prior colon cancer and polyps, 7 mm adenoma 2012  . OVARIAN CYST SURGERY    . PARTIAL MASTECTOMY WITH NEEDLE LOCALIZATION Left 04/22/2016   Procedure: LEFT PARTIAL MASTECTOMY WITH DOUBLE NEEDLE LOCALIZATION REEXCISION INFERIOR AND LATERAL MARGIN ADJACENT TISSUE TRANSFER;  Surgeon: [name], MD;  Location: [facility];  Service: General;  Laterality: Left;  . TONSILLECTOMY  FAMILY HISTORY Family History  Problem Relation Age of Onset  . Colon cancer Mother 6  . Colon polyps Father 35  The patient's father died at age 50 from postoperative complications in the setting of peptic ulcer disease.  The patient's mother died at age 55 with colon cancer. The patient had 5 brothers, 2 sisters. One brother died in an automobile accident. One brother has colitis. A maternal aunt developed cancer in her "female organs" in her 22s. The patient has no further information regarding this. She is not aware of any other cancers in the family aside of course from her own remote colon cancer.  GYNECOLOGIC HISTORY:  No LMP recorded. Patient has had a hysterectomy. Menarche age 61, first live birth age 53. The patient is [name] P1 area in 1955 she underwent total abdominal hysterectomy with bilateral salpingo-oophorectomy. She tried hormone replacement briefly (less than a year). She never took oral contraceptives.  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 She is widowed and lives by herself with no pets. Her son Teresa Boyd is a pulmonologist in [place], [place]. The patient has 4 grandchildren, and no great grandchildren. She attends [employer] locally. 3 of her siblings live in town including of course her sister Teresa Boyd, who is a retired [employer] consultant    ADVANCED DIRECTIVES:At the 03/27/2016 visit the patient was given the appropriate forms to complete and notarize at her discretion. She intends to name her son Teresa Boyd as healthcare power of attorney. He can be reached at [phone]  HEALTH MAINTENANCE: Social History  Substance Use Topics  . Smoking status: Never Smoker  . Smokeless tobacco: Never Used  . Alcohol use No     Colonoscopy:02/18/2011, [facility]; tubular adenoma with no high-grade dysplasia  Bone density: [facility], 01/08/2010, T score -2.2, decreased from prior   Allergies  Allergen Reactions  . Sotradecol [Sodium Tetradecyl Sulfate] Anaphylaxis  . Celebrex [Celecoxib]   . Clindamycin   . Conjugated Estrogens   . Estradiol Benzoate Other (See Comments)    Caused fast heart rate  . Estrogens Conjugated   . Hypaque-M [Diatrizone Sodium-Diatrizone  Meglumine] Hives    Ct scan IVP dye  . Penicillins Hives  . Tramadol Nausea Only    Current Outpatient Prescriptions  Medication Sig Dispense Refill  . calcitonin, salmon, (MIACALCIN) 200 UNIT/ACT nasal spray 1 spray by Nasal route daily.      . Calcium Carbonate (CALCIUM-CARB 600 PO) Take by mouth.    . cetirizine (ZYRTEC) 10 MG tablet Take 10 mg by mouth daily.    . Cholecalciferol (VITAMIN D3) 1000 units CAPS Take by mouth.    . Grape Seed Extract 100 MG CAPS Take 400 each by mouth.    . Multiple Vitamin (MULTIVITAMIN) tablet Take 1 tablet by mouth daily.      . Omega-3 Fatty Acids (FISH OIL) 1000 MG CPDR Take by mouth.     No current facility-administered medications for this visit.     OBJECTIVE: Older white woman Who appears well Vitals:   05/27/16 1023  BP: 133/65  Pulse: 63  Resp: 18  Temp: 98.4 F (36.9 C)     Body mass index is 24.3 kg/m.    ECOG FS:0 - Asymptomatic  Sclerae unicteric, pupils round and equal Oropharynx clear and moist-- no thrush or other lesions No cervical or supraclavicular adenopathy Lungs no rales or rhonchi Heart regular rate and rhythm Abd soft, nontender, positive bowel sounds MSK no focal spinal tenderness,  no upper extremity lymphedema Neuro: nonfocal, well oriented, appropriate affect Breasts: The right breast is unremarkable. The left breast is status post recent lumpectomy. The incision is healing very nicely. The overall cosmetic result is good, but there is a mild indentation in the superior aspect of the areola which does distort the breast contour. There is no evidence of residual or recurrent disease. The left axilla is benign.    LAB RESULTS:  CMP     Component Value Date/Time   NA 142 12/29/2006 1210   K 4.0 12/29/2006 1210   CL 107 12/29/2006 1210   CO2 32 12/29/2006 1210   GLUCOSE 93 12/29/2006 1210   BUN 13 12/29/2006 1210   CREATININE 0.7 12/29/2006 1210   CALCIUM 9.5 12/29/2006 1210   PROT 6.8 12/29/2006 1210     ALBUMIN 3.5 12/29/2006 1210   AST 22 12/29/2006 1210   ALT 19 12/29/2006 1210   ALKPHOS 56 12/29/2006 1210   BILITOT 1.0 12/29/2006 1210   GFRNONAA 87 12/29/2006 1210   GFRAA 105 12/29/2006 1210    INo results found for: SPEP, UPEP  Lab Results  Component Value Date   WBC 4.2 (L) 12/29/2006   NEUTROABS 2.4 12/29/2006   HGB 13.3 12/29/2006   HCT 38.7 12/29/2006   MCV 92.1 12/29/2006   PLT 232 12/29/2006      Chemistry      Component Value Date/Time   NA 142 12/29/2006 1210   K 4.0 12/29/2006 1210   CL 107 12/29/2006 1210   CO2 32 12/29/2006 1210   BUN 13 12/29/2006 1210   CREATININE 0.7 12/29/2006 1210      Component Value Date/Time   CALCIUM 9.5 12/29/2006 1210   ALKPHOS 56 12/29/2006 1210   AST 22 12/29/2006 1210   ALT 19 12/29/2006 1210   BILITOT 1.0 12/29/2006 1210       No results found for: LABCA2  No components found for: LABCA125  No results for input(s): INR in the last 168 hours.  Urinalysis No results found for: COLORURINE, APPEARANCEUR, LABSPEC, PHURINE, GLUCOSEU, HGBUR, BILIRUBINUR, KETONESUR, PROTEINUR, UROBILINOGEN, NITRITE, LEUKOCYTESUR   STUDIES: No results found.  ELIGIBLE FOR AVAILABLE RESEARCH PROTOCOL: PALLAS  ASSESSMENT: 82 y.o. [place] woman status post left breast upper outer quadrant biopsy 03/17/2016 for a clinical T2 N0, stage IIa invasive ductal carcinoma, grade 2 or 3, minimally estrogen receptor positive at 5% with moderate staining intensity, progesterone receptor and HER-2 negative, with an MIB-1 of 5%  (1) Status post left Lumpectomy without sentinel lymph node sampling 04/22/2016 for apT2 pNX, stage II   invasive ductal carcinoma, grade 3, with negative margins  (a) repeat prognostic panel pending  (2) Mammaprint results "high risk" suggests a risk of recurrence with no systemic treatment of 29% (2) adjuvant radiation pending  (3) consideration of adjuvant anti-estrogens  PLAN: I discussed the Mammaprint results  in detail with Teresa Boyd. Basically what this tells [place] is in the absence of any systemic treatment she would have a 70% chance of this cancer not recurring. In general Mammaprint predicts that if she takes both anti-estrogens and chemotherapy her chance of this cancer not recurring within 5 years with approximate 95%.  However I think this over states the benefit. First of all her breast cancer was only 5% estrogen receptor positive and was progesterone receptor negative. I don't think we can calculate the usual significant risk reduction for anti-estrogens implied by the Mammaprint in this case. Given the fact that chemotherapy generally reduces risk by  approximately one third of what it her a 10% risk reduction with chemotherapy and then perhaps a smaller risk reduction with anti-estrogens.  She will consider the chemotherapy and she of course has had chemotherapy previously for her colon cancer. I suggested CMF for a total of 6 cycles. At this point she is inclined to not receive chemotherapy. However she was to discuss this with her son and I gave her my cell phone so he can call if he wishes to discuss this further.  Assuming she decides against chemotherapy she will proceed to radiation. She will then return to see me in October. Before that visit she will have a DEXA scan and we will consider starting anastrozole. She may qualify for the [place] trial and if she does I would encourage her to participate.  She knows to call for any problems that may develop before the next visit here.  :Chauncey Cruel, MD   05/27/2016 10:42 AM Medical Oncology and Hematology [facility] [place], [place] [address] Tel. [phone]    Fax. [phone]

## 2016-05-27 NOTE — Telephone Encounter (Signed)
appt made and avs printed °

## 2016-06-01 ENCOUNTER — Telehealth: Payer: Self-pay | Admitting: *Deleted

## 2016-06-01 NOTE — Telephone Encounter (Signed)
"   I tried to call scheduling to reschedule my appointment on 06-03-2016 but they will call you back and this is too late.  Who can reschedule?" Appointment for radiology bone scan.  Central Scheduling number provided.

## 2016-06-02 ENCOUNTER — Ambulatory Visit
Admission: RE | Admit: 2016-06-02 | Discharge: 2016-06-02 | Disposition: A | Discharge status: Home / Self Care | Payer: Medicare Other | Source: Ambulatory Visit | Attending: Oncology | Admitting: Oncology

## 2016-06-02 ENCOUNTER — Telehealth: Payer: Self-pay

## 2016-06-02 DIAGNOSIS — M8580 Other specified disorders of bone density and structure, unspecified site: Secondary | ICD-10-CM

## 2016-06-02 DIAGNOSIS — C50412 Malignant neoplasm of upper-outer quadrant of left female breast: Secondary | ICD-10-CM

## 2016-06-02 NOTE — Telephone Encounter (Signed)
Pt called to r/s her appt with Dr Isidore Moos to something sooner than 9/20. Dr Isidore Moos asked her rn to see what pt had decided about chemotherapy. Anderson Malta RN called Juliann Pulse RN with this question. S/w pt and she has decided to not have chemotherapy. She is wanting to receive XRT and probable anastrazole. She is also getting her bone scan today.  Pt gave permission to lvm.

## 2016-06-03 ENCOUNTER — Other Ambulatory Visit: Payer: Medicare Other

## 2016-06-05 ENCOUNTER — Ambulatory Visit: Payer: Medicare Other | Admitting: Radiation Oncology

## 2016-06-05 ENCOUNTER — Ambulatory Visit: Payer: Medicare Other

## 2016-06-08 ENCOUNTER — Ambulatory Visit
Admission: RE | Admit: 2016-06-08 | Discharge: 2016-06-08 | Disposition: A | Discharge status: Home / Self Care | Payer: Medicare Other | Source: Ambulatory Visit | Attending: Radiation Oncology | Admitting: Radiation Oncology

## 2016-06-08 DIAGNOSIS — Z510 Encounter for antineoplastic radiation therapy: Secondary | ICD-10-CM | POA: Diagnosis not present

## 2016-06-08 DIAGNOSIS — C50412 Malignant neoplasm of upper-outer quadrant of left female breast: Secondary | ICD-10-CM

## 2016-06-08 NOTE — Progress Notes (Signed)
  Radiation Oncology         (336) [phone] ________________________________  Name: ANALEIA LEIPHART MRN: [id number]  Date: 06/08/2016  DOB: 05-Aug-1931  SIMULATION AND TREATMENT PLANNING NOTE    Outpatient  DIAGNOSIS:     ICD-9-CM ICD-10-CM   1. Breast cancer of upper-outer quadrant of left female breast ([facility]) 174.4 C50.412     NARRATIVE:  The patient was brought to the [facility].  Identity was confirmed.  All relevant records and images related to the planned course of therapy were reviewed.  The patient freely provided informed written consent to proceed with treatment after reviewing the details related to the planned course of therapy. The consent form was witnessed and verified by the simulation staff.    Then, the patient was set-up in a stable reproducible supine position for radiation therapy with her ipsilateral arm over her head, and her upper body secured in a custom-made Vac-lok device.  CT images were obtained.  Surface markings were placed.  The CT images were loaded into the planning software.    TREATMENT PLANNING NOTE: Treatment planning then occurred.  The radiation prescription was entered and confirmed.     A total of 3 medically necessary complex treatment devices were fabricated and supervised by me: 2 fields with MLCs for custom blocks to protect heart, and lungs;  and, a Vac-lok. MORE COMPLEX DEVICES MAY BE MADE IN DOSIMETRY FOR FIELD IN FIELD BEAMS FOR DOSE HOMOGENEITY.  I have requested : 3D Simulation  I have requested a DVH of the following structures: lungs, heart, lumpectomy cavity.    The patient will receive 40.05 Gy in 15 fractions to the left breast/ axilla with 2 high tangential fields.  This will be followed by a boost.  Optical Surface Tracking Plan:  Since intensity modulated radiotherapy (IMRT) and 3D conformal radiation treatment methods are predicated on accurate and precise positioning for treatment, intrafraction motion monitoring is  medically necessary to ensure accurate and safe treatment delivery. The ability to quantify intrafraction motion without excessive ionizing radiation dose can only be performed with optical surface tracking. Accordingly, surface imaging offers the opportunity to obtain 3D measurements of patient position throughout IMRT and 3D treatments without excessive radiation exposure. I am ordering optical surface tracking for this patient's upcoming course of radiotherapy.  ________________________________   Reference:  Ursula Alert, J, et al. Surface imaging-based analysis of intrafraction motion for breast radiotherapy patients.Journal of [place], n. 6, nov. 2014. ISSN [id number].  Available at: <http://www.jacmp.org/index.php/jacmp/article/view/4957>.    -----------------------------------  [name], MD

## 2016-06-10 ENCOUNTER — Ambulatory Visit: Payer: Medicare Other

## 2016-06-10 ENCOUNTER — Ambulatory Visit: Payer: Medicare Other | Admitting: Radiation Oncology

## 2016-06-10 DIAGNOSIS — Z510 Encounter for antineoplastic radiation therapy: Secondary | ICD-10-CM | POA: Diagnosis not present

## 2016-06-15 ENCOUNTER — Ambulatory Visit
Admission: RE | Admit: 2016-06-15 | Discharge: 2016-06-15 | Disposition: A | Discharge status: Home / Self Care | Payer: Medicare Other | Source: Ambulatory Visit | Attending: Radiation Oncology | Admitting: Radiation Oncology

## 2016-06-15 DIAGNOSIS — Z510 Encounter for antineoplastic radiation therapy: Secondary | ICD-10-CM | POA: Diagnosis not present

## 2016-06-16 ENCOUNTER — Telehealth: Payer: Self-pay | Admitting: *Deleted

## 2016-06-16 ENCOUNTER — Ambulatory Visit: Admission: RE | Admit: 2016-06-16 | Payer: Medicare Other | Source: Ambulatory Visit

## 2016-06-16 ENCOUNTER — Ambulatory Visit
Admission: RE | Admit: 2016-06-16 | Discharge: 2016-06-16 | Disposition: A | Discharge status: Home / Self Care | Payer: Medicare Other | Source: Ambulatory Visit | Attending: Radiation Oncology | Admitting: Radiation Oncology

## 2016-06-16 DIAGNOSIS — Z510 Encounter for antineoplastic radiation therapy: Secondary | ICD-10-CM | POA: Diagnosis not present

## 2016-06-16 NOTE — Telephone Encounter (Signed)
err

## 2016-06-16 NOTE — Telephone Encounter (Signed)
Patient called stating that MD Magrinat is suppose to send in medication for patient. Patient could not tell me the exact name, but she did say it was medication after she receives her "chemotherapy." (possibly tamoxifen). [facility] (phone # is [phone]).

## 2016-06-17 ENCOUNTER — Other Ambulatory Visit: Payer: Self-pay | Admitting: *Deleted

## 2016-06-17 ENCOUNTER — Ambulatory Visit
Admission: RE | Admit: 2016-06-17 | Discharge: 2016-06-17 | Disposition: A | Discharge status: Home / Self Care | Payer: Medicare Other | Source: Ambulatory Visit | Attending: Radiation Oncology | Admitting: Radiation Oncology

## 2016-06-17 DIAGNOSIS — Z510 Encounter for antineoplastic radiation therapy: Secondary | ICD-10-CM | POA: Diagnosis not present

## 2016-06-17 DIAGNOSIS — C50412 Malignant neoplasm of upper-outer quadrant of left female breast: Secondary | ICD-10-CM

## 2016-06-17 MED ORDER — RADIAPLEXRX EX GEL
Freq: Once | CUTANEOUS | Status: AC
  Administered 2016-06-17: 15:00:00 via TOPICAL

## 2016-06-17 MED ORDER — ALRA NON-METALLIC DEODORANT (RAD-ONC)
1.0000 "application " | Freq: Once | TOPICAL | Status: AC
  Administered 2016-06-17: 1 via TOPICAL

## 2016-06-17 NOTE — Telephone Encounter (Signed)
Pt called to this RN to state " the medication Dr Jana Hakim talked about me using has not been called in to my pharmacy and I am starting radiation today - and don't know if I am to be on it now ? "

## 2016-06-18 ENCOUNTER — Ambulatory Visit
Admission: RE | Admit: 2016-06-18 | Discharge: 2016-06-18 | Disposition: A | Discharge status: Home / Self Care | Payer: Medicare Other | Source: Ambulatory Visit | Attending: Radiation Oncology | Admitting: Radiation Oncology

## 2016-06-18 DIAGNOSIS — Z510 Encounter for antineoplastic radiation therapy: Secondary | ICD-10-CM | POA: Diagnosis not present

## 2016-06-19 ENCOUNTER — Ambulatory Visit
Admission: RE | Admit: 2016-06-19 | Discharge: 2016-06-19 | Disposition: A | Discharge status: Home / Self Care | Payer: Medicare Other | Source: Ambulatory Visit | Attending: Radiation Oncology | Admitting: Radiation Oncology

## 2016-06-19 DIAGNOSIS — Z510 Encounter for antineoplastic radiation therapy: Secondary | ICD-10-CM | POA: Diagnosis not present

## 2016-06-22 ENCOUNTER — Ambulatory Visit
Admission: RE | Admit: 2016-06-22 | Discharge: 2016-06-22 | Disposition: A | Discharge status: Home / Self Care | Payer: Medicare Other | Source: Ambulatory Visit | Attending: Radiation Oncology | Admitting: Radiation Oncology

## 2016-06-22 ENCOUNTER — Other Ambulatory Visit: Payer: Self-pay | Admitting: Oncology

## 2016-06-22 VITALS — BP 128/70 | HR 60 | Temp 98.0°F | Ht 63.0 in | Wt 138.4 lb

## 2016-06-22 DIAGNOSIS — Z510 Encounter for antineoplastic radiation therapy: Secondary | ICD-10-CM | POA: Diagnosis present

## 2016-06-22 DIAGNOSIS — Z800 Family history of malignant neoplasm of digestive organs: Secondary | ICD-10-CM | POA: Diagnosis not present

## 2016-06-22 DIAGNOSIS — Z79899 Other long term (current) drug therapy: Secondary | ICD-10-CM | POA: Diagnosis not present

## 2016-06-22 DIAGNOSIS — Z880 Allergy status to penicillin: Secondary | ICD-10-CM | POA: Diagnosis not present

## 2016-06-22 DIAGNOSIS — C50412 Malignant neoplasm of upper-outer quadrant of left female breast: Secondary | ICD-10-CM | POA: Diagnosis not present

## 2016-06-22 DIAGNOSIS — Z8371 Family history of colonic polyps: Secondary | ICD-10-CM | POA: Diagnosis not present

## 2016-06-22 DIAGNOSIS — Z85038 Personal history of other malignant neoplasm of large intestine: Secondary | ICD-10-CM | POA: Diagnosis not present

## 2016-06-22 DIAGNOSIS — Z171 Estrogen receptor negative status [ER-]: Principal | ICD-10-CM

## 2016-06-22 DIAGNOSIS — Z881 Allergy status to other antibiotic agents status: Secondary | ICD-10-CM | POA: Diagnosis not present

## 2016-06-22 DIAGNOSIS — Z888 Allergy status to other drugs, medicaments and biological substances status: Secondary | ICD-10-CM | POA: Diagnosis not present

## 2016-06-22 NOTE — Progress Notes (Signed)
   Weekly Management Note:  outpatient    ICD-9-CM ICD-10-CM   1. Malignant neoplasm of upper-outer quadrant of left breast in female, estrogen receptor negative ([facility]) 174.4 C50.412    V86.1 Z17.1     Current Dose:  13.35 Gy  Projected Dose: 50.05 Gy   Narrative:  The patient presents for routine under treatment assessment.  CBCT/MVCT images/Port film x-rays were reviewed.  The chart was checked. Doing well. No skin changes.  Twinges in breast noted.  Physical Findings:  height is 5\' 3"  (1.6 m) and weight is 138 lb 6.4 oz (62.8 kg). Her temperature is 98 F (36.7 C). Her blood pressure is 128/70 and her pulse is 60. Her oxygen saturation is 98%.   Wt Readings from Last 3 Encounters:  06/22/16 138 lb 6.4 oz (62.8 kg)  05/27/16 137 lb 3.2 oz (62.2 kg)  05/13/16 138 lb 3.2 oz (62.7 kg)   No skin irritation over L breast   Impression:  The patient is tolerating radiotherapy.  Plan:  Continue radiotherapy as planned. Patient instructed to apply Radiplex to intact skin in treatment fields.    ________________________________   [name], M.D.

## 2016-06-22 NOTE — Progress Notes (Signed)
Ms. Sarti is here for her 5th fraction of radiation to her Left Breast. She denies pain, except an occasional "twinge" in her Left Breast especially after her treatment last Thursday. She denies changes to her Left Breast related to radiation. She is using the Radiaplex twice daily.   BP 128/70   Pulse 60   Temp 98 F (36.7 C)   Ht 5\' 3"  (1.6 m)   Wt 138 lb 6.4 oz (62.8 kg)   SpO2 98% Comment: room air  BMI 24.52 kg/m    Wt Readings from Last 3 Encounters:  06/22/16 138 lb 6.4 oz (62.8 kg)  05/27/16 137 lb 3.2 oz (62.2 kg)  05/13/16 138 lb 3.2 oz (62.7 kg)

## 2016-06-23 ENCOUNTER — Ambulatory Visit
Admission: RE | Admit: 2016-06-23 | Discharge: 2016-06-23 | Disposition: A | Discharge status: Home / Self Care | Payer: Medicare Other | Source: Ambulatory Visit | Attending: Radiation Oncology | Admitting: Radiation Oncology

## 2016-06-23 DIAGNOSIS — Z510 Encounter for antineoplastic radiation therapy: Secondary | ICD-10-CM | POA: Diagnosis not present

## 2016-06-24 ENCOUNTER — Ambulatory Visit
Admission: RE | Admit: 2016-06-24 | Discharge: 2016-06-24 | Disposition: A | Discharge status: Home / Self Care | Payer: Medicare Other | Source: Ambulatory Visit | Attending: Radiation Oncology | Admitting: Radiation Oncology

## 2016-06-24 ENCOUNTER — Encounter: Payer: Self-pay | Admitting: Radiation Oncology

## 2016-06-24 DIAGNOSIS — Z510 Encounter for antineoplastic radiation therapy: Secondary | ICD-10-CM | POA: Diagnosis not present

## 2016-06-24 NOTE — Progress Notes (Signed)
  Radiation Oncology         (336) [phone]  Photon Boost Complex Simulation and Treatment Planning Note Outpatient Breast cancer of upper-outer quadrant of left female breast ([facility]) 174.4 C50.412   Diagnosis: Breast Cancer  The patient's CT images from her free-breathing simulation were reviewed to plan her boost treatment to her left breast  lumpectomy cavity.  The boost to the lumpectomy cavity will be delivered with 3 photon fields using MLCs for custom blocks against heart and lungs, with 6 and 10 MV photon energy.  This constitutes 3 complex treatment devices. Isodose plan was reviewed and approved. 10 Gy in 5 fractions prescribed.  -----------------------------------  Teresa Gibson, MD

## 2016-06-25 ENCOUNTER — Other Ambulatory Visit: Payer: Self-pay | Admitting: *Deleted

## 2016-06-25 ENCOUNTER — Ambulatory Visit
Admission: RE | Admit: 2016-06-25 | Discharge: 2016-06-25 | Disposition: A | Discharge status: Home / Self Care | Payer: Medicare Other | Source: Ambulatory Visit | Attending: Radiation Oncology | Admitting: Radiation Oncology

## 2016-06-25 DIAGNOSIS — C50412 Malignant neoplasm of upper-outer quadrant of left female breast: Secondary | ICD-10-CM

## 2016-06-25 DIAGNOSIS — Z510 Encounter for antineoplastic radiation therapy: Secondary | ICD-10-CM | POA: Diagnosis not present

## 2016-06-26 ENCOUNTER — Ambulatory Visit
Admission: RE | Admit: 2016-06-26 | Discharge: 2016-06-26 | Disposition: A | Discharge status: Home / Self Care | Payer: Medicare Other | Source: Ambulatory Visit | Attending: Radiation Oncology | Admitting: Radiation Oncology

## 2016-06-26 ENCOUNTER — Ambulatory Visit (HOSPITAL_BASED_OUTPATIENT_CLINIC_OR_DEPARTMENT_OTHER): Payer: Medicare Other | Admitting: Oncology

## 2016-06-26 ENCOUNTER — Other Ambulatory Visit (HOSPITAL_BASED_OUTPATIENT_CLINIC_OR_DEPARTMENT_OTHER): Payer: Medicare Other

## 2016-06-26 VITALS — BP 133/72 | HR 62 | Temp 97.6°F | Resp 18 | Ht 63.0 in | Wt 139.5 lb

## 2016-06-26 DIAGNOSIS — C50412 Malignant neoplasm of upper-outer quadrant of left female breast: Secondary | ICD-10-CM

## 2016-06-26 DIAGNOSIS — M810 Age-related osteoporosis without current pathological fracture: Secondary | ICD-10-CM

## 2016-06-26 DIAGNOSIS — Z510 Encounter for antineoplastic radiation therapy: Secondary | ICD-10-CM | POA: Diagnosis not present

## 2016-06-26 DIAGNOSIS — M818 Other osteoporosis without current pathological fracture: Secondary | ICD-10-CM

## 2016-06-26 DIAGNOSIS — Z170 Estrogen receptor positive status [ER+]: Principal | ICD-10-CM

## 2016-06-26 LAB — CBC WITH DIFFERENTIAL/PLATELET
BASO%: 0.9 % (ref 0.0–2.0)
BASOS ABS: 0 10*3/uL (ref 0.0–0.1)
EOS ABS: 0.2 10*3/uL (ref 0.0–0.5)
EOS%: 4.8 % (ref 0.0–7.0)
HCT: 41 % (ref 34.8–46.6)
HGB: 13.3 g/dL (ref 11.6–15.9)
LYMPH%: 27 % (ref 14.0–49.7)
MCH: 30.9 pg (ref 25.1–34.0)
MCHC: 32.5 g/dL (ref 31.5–36.0)
MCV: 94.8 fL (ref 79.5–101.0)
MONO#: 0.4 10*3/uL (ref 0.1–0.9)
MONO%: 13.7 % (ref 0.0–14.0)
NEUT%: 53.6 % (ref 38.4–76.8)
NEUTROS ABS: 1.7 10*3/uL (ref 1.5–6.5)
PLATELETS: 190 10*3/uL (ref 145–400)
RBC: 4.32 10*6/uL (ref 3.70–5.45)
RDW: 13.9 % (ref 11.2–14.5)
WBC: 3.2 10*3/uL — AB (ref 3.9–10.3)
lymph#: 0.9 10*3/uL (ref 0.9–3.3)

## 2016-06-26 LAB — COMPREHENSIVE METABOLIC PANEL
ALBUMIN: 3.6 g/dL (ref 3.5–5.0)
ALK PHOS: 67 U/L (ref 40–150)
ALT: 13 U/L (ref 0–55)
ANION GAP: 7 meq/L (ref 3–11)
AST: 19 U/L (ref 5–34)
BILIRUBIN TOTAL: 0.79 mg/dL (ref 0.20–1.20)
BUN: 15.6 mg/dL (ref 7.0–26.0)
CO2: 26 meq/L (ref 22–29)
Calcium: 9.2 mg/dL (ref 8.4–10.4)
Chloride: 106 mEq/L (ref 98–109)
Creatinine: 0.7 mg/dL (ref 0.6–1.1)
EGFR: 79 mL/min/{1.73_m2} — AB (ref >90)
Glucose: 93 mg/dl (ref 70–140)
POTASSIUM: 4.3 meq/L (ref 3.5–5.1)
SODIUM: 140 meq/L (ref 136–145)
TOTAL PROTEIN: 6.8 g/dL (ref 6.4–8.3)

## 2016-06-26 MED ORDER — ANASTROZOLE 1 MG PO TABS: 1.0000 mg | ORAL_TABLET | Freq: Every day | ORAL | 4 refills | Status: DC

## 2016-06-26 NOTE — Progress Notes (Signed)
[facility]  [phone] [phone]     ID: Teresa Boyd DOB: [birth year]  MR#: [id number]  HOO#:[id number]  Patient Care Team: De Nurse, MD as PCP - General (Family Medicine) [name], MD as Consulting Physician (General Surgery) [name], MD as Consulting Physician (Gastroenterology) Chauncey Cruel, MD as Consulting Physician (Oncology) [name], MD as Attending Physician (Radiation Oncology) [name], MD as Consulting Physician (Family Medicine) [name], MD as Consulting Physician (Obstetrics and Gynecology) [name], MD as Consulting Physician (Orthopedic Surgery) OTHER MD:  CHIEF COMPLAINT: Estrogen receptor positive invasive breast cancer  CURRENT TREATMENT: adjuvant radiation   BREAST CANCER HISTORY: From the original intake note:  Teresa Boyd herself palpated a mass in her left breast mid-June 2017. She brought it to the attention of Dr. Coletta Memos who obtained screening mammography showing possible abnormalities in both breasts. Teresa Boyd was then referred to the [facility] where mammography and ultrasonography 03/18/2015 showed in the right breast an 8 mm spiculated mass at the 11:00 position 5 cm from the nipple and a second Boyd of altered architecture also in the upper outer quadrant. In the left breast there was a 2.5 cm is spiculated mass in the upper outer quadrant 4 cm from the nipple. Ultrasound of both axillae were negative.  On 03/17/2016 the patient underwent biopsy of all 3 suspicious areas. In the right breast, both areas showed only fibrocystic change, with no evidence of malignancy (SAA [id number] and [id number]). This was read as concordant by radiology.  In the left breast however the final pathology (SAA [id number]) showed an invasive ductal carcinoma, grade 2 or 3, estrogen receptor 5% positive, with moderate staining intensity, progesterone receptor negative, with an MIB-1 of 5%, and no HER-2  amplification, the signals ratio being 1.53 and the number per cell 3.45.  Teresa Boyd also has a remote history of colon cancer, in remission for more than 10 years with the most recent colonoscopy 2012 negative  INTERVAL HISTORY: Teresa Boyd returns today for follow-up of her estrogen receptor positive breast cancer. At the last visit we discussed her Mammaprint results, which were high risk, and predicted a risk of recurrence in the 30% range with no treatment beyond local therapy. We offered her chemotherapy with CMFshe understands the benefit of that would be a risk reduction of about 10%.  She discussed this with her son and after much sold searching she decided she preferred not to receive the chemotherapy. She has met with Dr. Isidore Moos and has really started her radiation treatments  REVIEW OF SYSTEMS: Angelli Is tolerating the radiation well so far. She has had no unusual fatigue and no skin changes. A detailed review of systems today was otherwise entirely stable.  PAST MEDICAL HISTORY: Past Medical History:  Diagnosis Date  . Breast cancer ([facility]) 2017   left breast  . Colon cancer ([facility] [place]) 1994  . Hx of adenomatous colonic polyps   . Varicose vein     PAST SURGICAL HISTORY: Past Surgical History:  Procedure Laterality Date  . ABDOMINAL HYSTERECTOMY    . COLON RESECTION     sigmoid  . COLON SURGERY  1994   colon cancer  . COLONOSCOPY W/ POLYPECTOMY  last 02/18/11   multiple, prior colon cancer and polyps, 7 mm adenoma 2012  . OVARIAN CYST SURGERY    . PARTIAL MASTECTOMY WITH NEEDLE LOCALIZATION Left 04/22/2016   Procedure: LEFT PARTIAL MASTECTOMY WITH DOUBLE NEEDLE LOCALIZATION REEXCISION INFERIOR AND LATERAL MARGIN ADJACENT TISSUE TRANSFER;  Surgeon:  [name], MD;  Location: [facility];  Service: General;  Laterality: Left;  . TONSILLECTOMY      FAMILY HISTORY Family History  Problem Relation Age of Onset  . Colon cancer Mother 5  . Colon polyps Father 41  The  patient's father died at age 83 from postoperative complications in the setting of peptic ulcer disease. The patient's mother died at age 59 with colon cancer. The patient had 5 brothers, 2 sisters. One brother died in an automobile accident. One brother has colitis. A maternal aunt developed cancer in her "female organs" in her 22s. The patient has no further information regarding this. She is not aware of any other cancers in the family aside of course from her own remote colon cancer.  GYNECOLOGIC HISTORY:  No LMP recorded. Patient has had a hysterectomy. Menarche age 62, first live birth age 43. The patient is Teresa Boyd in 1955 she underwent total abdominal hysterectomy with bilateral salpingo-oophorectomy. She tried hormone replacement briefly (less than a year). She never took oral contraceptives.  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 She is widowed and lives by herself with no pets. Her son Teresa Boyd is a pulmonologist in [place], [place]. The patient has 4 grandchildren, and no great grandchildren. She attends [employer] locally. 3 of her siblings live in town including of course her sister Teresa Boyd, who is a retired [employer] consultant    ADVANCED DIRECTIVES:At the 03/27/2016 visit the patient was given the appropriate forms to complete and notarize at her discretion. She intends to name her son Teresa Boyd as healthcare power of attorney. He can be reached at [phone]  HEALTH MAINTENANCE: Social History  Substance Use Topics  . Smoking status: Never Smoker  . Smokeless tobacco: Never Used  . Alcohol use No     Colonoscopy:02/18/2011, [facility]; tubular adenoma with no high-grade dysplasia  Bone density: [facility], 01/08/2010, T score -2.2, decreased from prior   Allergies  Allergen Reactions  . Sotradecol [Sodium Tetradecyl Sulfate] Anaphylaxis  . Celebrex [Celecoxib]   . Clindamycin   . Conjugated Estrogens   . Estradiol Benzoate Other (See  Comments)    Caused fast heart rate  . Estrogens Conjugated   . Hypaque-M [Diatrizone Sodium-Diatrizone Meglumine] Hives    Ct scan IVP dye  . Penicillins Hives  . Tramadol Nausea Only    Current Outpatient Prescriptions  Medication Sig Dispense Refill  . anastrozole (ARIMIDEX) 1 MG tablet Take 1 tablet (1 mg total) by mouth daily. 90 tablet 4  . calcitonin, salmon, (MIACALCIN) 200 UNIT/ACT nasal spray 1 spray by Nasal route daily.      . Calcium Carbonate (CALCIUM-CARB 600 PO) Take by mouth.    . cetirizine (ZYRTEC) 10 MG tablet Take 10 mg by mouth daily.    . Cholecalciferol (VITAMIN D3) 1000 units CAPS Take by mouth.    . Grape Seed Extract 100 MG CAPS Take 400 each by mouth.    . Multiple Vitamin (MULTIVITAMIN) tablet Take 1 tablet by mouth daily.      . Omega-3 Fatty Acids (FISH OIL) 1000 MG CPDR Take by mouth.     No current facility-administered medications for this visit.     OBJECTIVE: Older white woman in no acute distress Vitals:   06/26/16 1056  BP: 133/72  Pulse: 62  Resp: 18  Temp: 97.6 F (36.4 C)     Body mass index is 24.71 kg/m.    ECOG FS:0 -  Asymptomatic  Sclerae unicteric, EOMs intact Oropharynx clear and moist No cervical or supraclavicular adenopathy Lungs no rales or rhonchi Heart regular rate and rhythm Abd soft, nontender, positive bowel sounds MSK no focal spinal tenderness, no upper extremity lymphedema Neuro: nonfocal, well oriented, appropriate affect Breasts: Deferred   LAB RESULTS:  CMP     Component Value Date/Time   NA 140 06/26/2016 1023   K 4.3 06/26/2016 1023   CL 107 12/29/2006 1210   CO2 26 06/26/2016 1023   GLUCOSE 93 06/26/2016 1023   BUN 15.6 06/26/2016 1023   CREATININE 0.7 06/26/2016 1023   CALCIUM 9.2 06/26/2016 1023   PROT 6.8 06/26/2016 1023   ALBUMIN 3.6 06/26/2016 1023   AST 19 06/26/2016 1023   ALT 13 06/26/2016 1023   ALKPHOS 67 06/26/2016 1023   BILITOT 0.79 06/26/2016 1023   GFRNONAA 87 12/29/2006  1210   GFRAA 105 12/29/2006 1210    INo results found for: SPEP, UPEP  Lab Results  Component Value Date   WBC 3.2 (L) 06/26/2016   NEUTROABS 1.7 06/26/2016   HGB 13.3 06/26/2016   HCT 41.0 06/26/2016   MCV 94.8 06/26/2016   PLT 190 06/26/2016      Chemistry      Component Value Date/Time   NA 140 06/26/2016 1023   K 4.3 06/26/2016 1023   CL 107 12/29/2006 1210   CO2 26 06/26/2016 1023   BUN 15.6 06/26/2016 1023   CREATININE 0.7 06/26/2016 1023      Component Value Date/Time   CALCIUM 9.2 06/26/2016 1023   ALKPHOS 67 06/26/2016 1023   AST 19 06/26/2016 1023   ALT 13 06/26/2016 1023   BILITOT 0.79 06/26/2016 1023       No results found for: LABCA2  No components found for: LABCA125  No results for input(s): INR in the last 168 hours.  Urinalysis No results found for: COLORURINE, APPEARANCEUR, LABSPEC, PHURINE, GLUCOSEU, HGBUR, BILIRUBINUR, KETONESUR, PROTEINUR, UROBILINOGEN, NITRITE, LEUKOCYTESUR   STUDIES: Dg Bone Density  Result Date: 06/02/2016 EXAM: DUAL X-RAY ABSORPTIOMETRY (DXA) FOR BONE MINERAL DENSITY IMPRESSION: Referring Physician:  Lurline Del PATIENT: Name: Teresa Boyd, Teresa Boyd Patient ID: [id number] Birth Date: June 21, 1931 Height: 63.0 in. Sex: Female Measured: 06/02/2016 Weight: 138.0 lbs. Indications: Advanced Age, Bilateral Ovariectomy (65.51), Breast Cancer History, Caucasian, Estrogen Deficient, Family Hist. (Parent hip fracture), History of Fracture (Adult) (V15.51), Hysterectomy, Postmenopausal, Tums Fractures: coccyx Treatments: Calcitonin, Vitamin D (E933.5) ASSESSMENT: The BMD measured at Femur Neck Left is 0.660 g/cm2 with a T-score of -2.7. This patient is considered osteoporotic according to [facility]) criteria. L-1 was excluded due to degenerative changes. There has been a statistically significant decrease in BMD of Lumbar spine and there has been a statistically significant increase in BMD of right hip since prior exam  dated 02/11/2001. Site Region Measured Date Measured Age YA BMD Significant CHANGE T-score AP Spine  L2-L4     8/02/2016    85.0         -2.7    0.880 g/cm2 * DualFemur Neck Left 06/02/2016    85.0         -2.7    0.660 g/cm2 World Health Organization [facility]) criteria for post-menopausal, Caucasian Women: Normal       T-score at or above -1 SD Osteopenia   T-score between -1 and -2.5 SD Osteoporosis T-score at or below -2.5 SD RECOMMENDATION: [facility] recommends that FDA-approved medical therapies be considered in postmenopausal women and men age 40  or older with a: 1. Hip or vertebral (clinical or morphometric) fracture. 2. T-score of <-2.5 at the spine or hip. 3. Ten-year fracture probability by FRAX of 3% or greater for hip fracture or 20% or greater for major osteoporotic fracture. All treatment decisions require clinical judgment and consideration of individual patient factors, including patient preferences, co-morbidities, previous drug use, risk factors not captured in the FRAX model (e.g. falls, vitamin D deficiency, increased bone turnover, interval significant decline in bone density) and possible under - or over-estimation of fracture risk by FRAX. All patients should ensure an adequate intake of dietary calcium (1200 mg/d) and vitamin D (800 IU daily) unless contraindicated. FOLLOW-UP: People with diagnosed cases of osteoporosis or at high risk for fracture should have regular bone mineral density tests. For patients eligible for Medicare, routine testing is allowed once every 2 years. The testing frequency can be increased to one year for patients who have rapidly progressing disease, those who are receiving or discontinuing medical therapy to restore bone mass, or have additional risk factors. I have reviewed this report, and agree with the above findings. [facility] Radiology Electronically Signed   By: Lajean Manes M.D.   On: 06/02/2016 14:56    ELIGIBLE FOR AVAILABLE  RESEARCH PROTOCOL: PALLAS  ASSESSMENT: 82 y.o. [place] woman status post left breast upper outer quadrant biopsy 03/17/2016 for a clinical T2 N0, stage IIa invasive ductal carcinoma, grade 2 or 3, minimally estrogen receptor positive at 5% with moderate staining intensity, progesterone receptor and HER-2 negative, with an MIB-1 of 5%  (1) Status post left Lumpectomy without sentinel lymph node sampling 04/22/2016 for apT2 pNX, stage II   invasive ductal carcinoma, grade 3, with negative margins  (a) repeat prognostic panel pending  (2) Mammaprint results "high risk" suggests a risk of recurrence with no systemic treatment of 29%   (a) patient offered adjuvant CMF chemotherapy but opted against it   (3) adjuvant radiation to be completed later this month  (4) to start anastrozole 07/22/2016  (a) DEXA scan 06/02/2016 shows osteoporosis, with a T score of -2.7  PLAN: Zoila asked me if I had any further input on her chemotherapy decision. I think she has made a decision which is probably the right one for her. If she wanted chemotherapy we could've gotten her through CMF, but it may have been something of a struggle. Chemotherapy would not eliminate the risk of breast cancer although it would have decreased it by approximately 10%. In short I am not uncomfortable with her decision. We are moving ahead with radiation and will start anastrozole as soon as radiation is completed  Today we again reviewed the possible toxicities, side effects and complications of anastrozole and she understands this is not "the pill that causes blood clot". It can cause aches and  Pains and if she develops those symptoms, or if I flashes or vaginal dryness become a significant problem she will let me know.  The one concern of course is bone density, and we obtained a DEXA scan last month which shows osteoporosis, with a T score of -2.7. When she returns to see me  We will discuss the denosumab/Prolia, and I wrote it  down for her today so she can start discussing that with her son as well.  If she starts anastrozole November 1, I will plan to see her mid-December to assess tolerance. We will obtain a vitamin D level before that visit  She knows to call for any other problems that  may develop before that.  :Chauncey Cruel, MD   06/26/2016 11:25 AM Medical Oncology and Hematology [facility] [address] [place], [place] [address] Tel. [phone]    Fax. [phone]

## 2016-06-29 ENCOUNTER — Ambulatory Visit: Payer: Medicare Other | Admitting: Radiation Oncology

## 2016-06-29 ENCOUNTER — Encounter: Payer: Self-pay | Admitting: Radiation Oncology

## 2016-06-29 ENCOUNTER — Ambulatory Visit
Admission: RE | Admit: 2016-06-29 | Discharge: 2016-06-29 | Disposition: A | Discharge status: Home / Self Care | Payer: Medicare Other | Source: Ambulatory Visit | Attending: Radiation Oncology | Admitting: Radiation Oncology

## 2016-06-29 VITALS — BP 134/81 | HR 62 | Temp 97.9°F | Ht 63.0 in | Wt 142.6 lb

## 2016-06-29 DIAGNOSIS — C50412 Malignant neoplasm of upper-outer quadrant of left female breast: Secondary | ICD-10-CM

## 2016-06-29 DIAGNOSIS — Z170 Estrogen receptor positive status [ER+]: Principal | ICD-10-CM

## 2016-06-29 DIAGNOSIS — Z510 Encounter for antineoplastic radiation therapy: Secondary | ICD-10-CM | POA: Diagnosis not present

## 2016-06-29 NOTE — Progress Notes (Signed)
Teresa Boyd has completed 10 fractions to her left breast.  She denies having pain and fatigue.  She is using radiaplex.  The skin on her left breast is intact.  BP 134/81 (BP Location: Right Arm, Patient Position: Sitting)   Pulse 62   Temp 97.9 F (36.6 C) (Oral)   Ht 5\' 3"  (1.6 m)   Wt 142 lb 9.6 oz (64.7 kg)   SpO2 100%   BMI 25.26 kg/m    Wt Readings from Last 3 Encounters:  06/29/16 142 lb 9.6 oz (64.7 kg)  06/26/16 139 lb 8 oz (63.3 kg)  06/22/16 138 lb 6.4 oz (62.8 kg)

## 2016-06-29 NOTE — Progress Notes (Addendum)
    Weekly Management Note:  outpatient    ICD-9-CM ICD-10-CM   1. Malignant neoplasm of upper-outer quadrant of left breast in female, estrogen receptor positive (HCC) 174.4 C50.412    V86.0 Z17.0     Current Dose:  26.7 Gy  Projected Dose: 50.05 Gy   Narrative:  The patient presents for routine under treatment assessment.  CBCT/MVCT images/Port film x-rays were reviewed.  The chart was checked. Doing well.    Physical Findings:  height is 5\' 3"  (1.6 m) and weight is 142 lb 9.6 oz (64.7 kg). Her oral temperature is 97.9 F (36.6 C). Her blood pressure is 134/81 and her pulse is 62. Her oxygen saturation is 100%.   Wt Readings from Last 3 Encounters:  06/29/16 142 lb 9.6 oz (64.7 kg)  06/26/16 139 lb 8 oz (63.3 kg)  06/22/16 138 lb 6.4 oz (62.8 kg)   No obvious skin irritation over L breast   Impression:  The patient is tolerating radiotherapy.  Plan:  Continue radiotherapy as planned. Patient instructed to apply Radiplex to intact skin in treatment fields.    ________________________________   [name], M.D.

## 2016-06-30 ENCOUNTER — Ambulatory Visit
Admission: RE | Admit: 2016-06-30 | Discharge: 2016-06-30 | Disposition: A | Discharge status: Home / Self Care | Payer: Medicare Other | Source: Ambulatory Visit | Attending: Radiation Oncology | Admitting: Radiation Oncology

## 2016-06-30 DIAGNOSIS — Z510 Encounter for antineoplastic radiation therapy: Secondary | ICD-10-CM | POA: Diagnosis not present

## 2016-07-01 ENCOUNTER — Ambulatory Visit
Admission: RE | Admit: 2016-07-01 | Discharge: 2016-07-01 | Disposition: A | Discharge status: Home / Self Care | Payer: Medicare Other | Source: Ambulatory Visit | Attending: Radiation Oncology | Admitting: Radiation Oncology

## 2016-07-01 DIAGNOSIS — Z510 Encounter for antineoplastic radiation therapy: Secondary | ICD-10-CM | POA: Diagnosis not present

## 2016-07-02 ENCOUNTER — Ambulatory Visit
Admission: RE | Admit: 2016-07-02 | Discharge: 2016-07-02 | Disposition: A | Discharge status: Home / Self Care | Payer: Medicare Other | Source: Ambulatory Visit | Attending: Radiation Oncology | Admitting: Radiation Oncology

## 2016-07-02 DIAGNOSIS — Z510 Encounter for antineoplastic radiation therapy: Secondary | ICD-10-CM | POA: Diagnosis not present

## 2016-07-03 ENCOUNTER — Ambulatory Visit: Payer: Medicare Other | Admitting: Radiation Oncology

## 2016-07-03 ENCOUNTER — Ambulatory Visit
Admission: RE | Admit: 2016-07-03 | Discharge: 2016-07-03 | Disposition: A | Discharge status: Home / Self Care | Payer: Medicare Other | Source: Ambulatory Visit | Attending: Radiation Oncology | Admitting: Radiation Oncology

## 2016-07-03 DIAGNOSIS — Z510 Encounter for antineoplastic radiation therapy: Secondary | ICD-10-CM | POA: Diagnosis not present

## 2016-07-06 ENCOUNTER — Ambulatory Visit
Admission: RE | Admit: 2016-07-06 | Discharge: 2016-07-06 | Disposition: A | Discharge status: Home / Self Care | Payer: Medicare Other | Source: Ambulatory Visit | Attending: Radiation Oncology | Admitting: Radiation Oncology

## 2016-07-06 VITALS — BP 135/68 | HR 59 | Temp 98.4°F | Resp 12 | Wt 141.6 lb

## 2016-07-06 DIAGNOSIS — Z510 Encounter for antineoplastic radiation therapy: Secondary | ICD-10-CM | POA: Diagnosis not present

## 2016-07-06 DIAGNOSIS — C50412 Malignant neoplasm of upper-outer quadrant of left female breast: Secondary | ICD-10-CM

## 2016-07-06 DIAGNOSIS — Z170 Estrogen receptor positive status [ER+]: Principal | ICD-10-CM

## 2016-07-06 NOTE — Progress Notes (Signed)
PAIN: She is currently in no pain.  SKIN: Pt left breast- positive for slight erythema.  Pt denies edema.  Pt continues to apply Radiaplex as directed. OTHER: BP 135/68   Pulse (!) 59   Temp 98.4 F (36.9 C) (Oral)   Resp 12   Wt 141 lb 9.6 oz (64.2 kg)   SpO2 99%   BMI 25.08 kg/m  Wt Readings from Last 3 Encounters:  07/06/16 141 lb 9.6 oz (64.2 kg)  06/29/16 142 lb 9.6 oz (64.7 kg)  06/26/16 139 lb 8 oz (63.3 kg)

## 2016-07-06 NOTE — Progress Notes (Signed)
    Weekly Management Note:  outpatient    ICD-9-CM ICD-10-CM   1. Malignant neoplasm of upper-outer quadrant of left breast in female, estrogen receptor positive (HCC) 174.4 C50.412    V86.0 Z17.0     Current Dose:  40.05 Gy  Projected Dose: 50.05 Gy   Narrative:  The patient presents for routine under treatment assessment.  CBCT/MVCT images/Port film x-rays were reviewed.  The chart was checked. Doing well.    Physical Findings:  weight is 141 lb 9.6 oz (64.2 kg). Her oral temperature is 98.4 F (36.9 C). Her blood pressure is 135/68 and her pulse is 59 (abnormal). Her respiration is 12 and oxygen saturation is 99%.   Wt Readings from Last 3 Encounters:  07/06/16 141 lb 9.6 oz (64.2 kg)  06/29/16 142 lb 9.6 oz (64.7 kg)  06/26/16 139 lb 8 oz (63.3 kg)   Mild erythema over L breast   Impression:  The patient is tolerating radiotherapy.  Plan:  Continue radiotherapy as planned. Patient instructed to apply Radiplex to intact skin in treatment fields.    ________________________________   [name], M.D.

## 2016-07-07 ENCOUNTER — Ambulatory Visit
Admission: RE | Admit: 2016-07-07 | Discharge: 2016-07-07 | Disposition: A | Discharge status: Home / Self Care | Payer: Medicare Other | Source: Ambulatory Visit | Attending: Radiation Oncology | Admitting: Radiation Oncology

## 2016-07-07 DIAGNOSIS — Z510 Encounter for antineoplastic radiation therapy: Secondary | ICD-10-CM | POA: Diagnosis not present

## 2016-07-08 ENCOUNTER — Ambulatory Visit
Admission: RE | Admit: 2016-07-08 | Discharge: 2016-07-08 | Disposition: A | Discharge status: Home / Self Care | Payer: Medicare Other | Source: Ambulatory Visit | Attending: Radiation Oncology | Admitting: Radiation Oncology

## 2016-07-08 DIAGNOSIS — Z510 Encounter for antineoplastic radiation therapy: Secondary | ICD-10-CM | POA: Diagnosis not present

## 2016-07-09 ENCOUNTER — Ambulatory Visit
Admission: RE | Admit: 2016-07-09 | Discharge: 2016-07-09 | Disposition: A | Discharge status: Home / Self Care | Payer: Medicare Other | Source: Ambulatory Visit | Attending: Radiation Oncology | Admitting: Radiation Oncology

## 2016-07-09 DIAGNOSIS — Z510 Encounter for antineoplastic radiation therapy: Secondary | ICD-10-CM | POA: Diagnosis not present

## 2016-07-10 ENCOUNTER — Ambulatory Visit
Admission: RE | Admit: 2016-07-10 | Discharge: 2016-07-10 | Disposition: A | Discharge status: Home / Self Care | Payer: Medicare Other | Source: Ambulatory Visit | Attending: Radiation Oncology | Admitting: Radiation Oncology

## 2016-07-10 DIAGNOSIS — Z510 Encounter for antineoplastic radiation therapy: Secondary | ICD-10-CM | POA: Diagnosis not present

## 2016-07-13 ENCOUNTER — Ambulatory Visit
Admission: RE | Admit: 2016-07-13 | Discharge: 2016-07-13 | Disposition: A | Discharge status: Home / Self Care | Payer: Medicare Other | Source: Ambulatory Visit | Attending: Radiation Oncology | Admitting: Radiation Oncology

## 2016-07-13 ENCOUNTER — Encounter: Payer: Self-pay | Admitting: Radiation Oncology

## 2016-07-13 VITALS — BP 133/82 | HR 67 | Temp 98.2°F | Resp 18 | Ht 63.0 in | Wt 140.4 lb

## 2016-07-13 DIAGNOSIS — Z510 Encounter for antineoplastic radiation therapy: Secondary | ICD-10-CM | POA: Diagnosis not present

## 2016-07-13 DIAGNOSIS — C50412 Malignant neoplasm of upper-outer quadrant of left female breast: Secondary | ICD-10-CM | POA: Diagnosis not present

## 2016-07-13 MED ORDER — RADIAPLEXRX EX GEL
Freq: Once | CUTANEOUS | Status: AC
  Administered 2016-07-13: 12:00:00 via TOPICAL

## 2016-07-13 NOTE — Progress Notes (Signed)
PAIN: She is currently in no pain.  SKIN: Pt left breast- positive for slight erythema.  Pt denies edema.  Pt continues to apply Radiaplex as directed.  EOT instructions given.  New Radiaplex gel given today.  One month follow up card given to see Dr. Isidore Moos. Wt Readings from Last 3 Encounters:  07/13/16 140 lb 6.4 oz (63.7 kg)  07/06/16 141 lb 9.6 oz (64.2 kg)  06/29/16 142 lb 9.6 oz (64.7 kg)  BP 133/82 (BP Location: Right Arm, Patient Position: Sitting, Cuff Size: Normal)   Pulse 67   Temp 98.2 F (36.8 C) (Oral)   Resp 18   Ht 5\' 3"  (1.6 m)   Wt 140 lb 6.4 oz (63.7 kg)   SpO2 96%   BMI 24.87 kg/m

## 2016-07-13 NOTE — Progress Notes (Signed)
    Weekly Management Note:  outpatient    ICD-9-CM ICD-10-CM   1. Malignant neoplasm of upper-outer quadrant of left female breast, unspecified estrogen receptor status (HCC) 174.4 C50.412 hyaluronate sodium (RADIAPLEXRX) gel    Current Dose:  50.05 Gy  Projected Dose: 50.05 Gy   Narrative:  The patient presents for routine under treatment assessment.  CBCT/MVCT images/Port film x-rays were reviewed.  The chart was checked. Doing well.    Physical Findings:  height is 5\' 3"  (1.6 m) and weight is 140 lb 6.4 oz (63.7 kg). Her oral temperature is 98.2 F (36.8 C). Her blood pressure is 133/82 and her pulse is 67. Her respiration is 18 and oxygen saturation is 96%.   Wt Readings from Last 3 Encounters:  07/13/16 140 lb 6.4 oz (63.7 kg)  07/06/16 141 lb 9.6 oz (64.2 kg)  06/29/16 142 lb 9.6 oz (64.7 kg)   Mild erythema over L breast with mild dermatitis in UIQ  Impression:  The patient tolerated radiotherapy.  Plan:  Continue radiotherapy as planned. Patient instructed to apply Radiplex to intact skin in treatment fields. F/u in 28mo    ________________________________   [name], M.D.

## 2016-07-14 ENCOUNTER — Ambulatory Visit: Payer: Medicare Other

## 2016-07-15 ENCOUNTER — Encounter: Payer: Self-pay | Admitting: Radiation Oncology

## 2016-07-15 ENCOUNTER — Telehealth: Payer: Self-pay | Admitting: *Deleted

## 2016-07-15 ENCOUNTER — Ambulatory Visit: Payer: Medicare Other

## 2016-07-15 NOTE — Progress Notes (Signed)
  Radiation Oncology         [phone] ________________________________  Name: Teresa Boyd MRN: [id number]  Date: 07/15/2016  DOB: 1931-06-26  End of Treatment Note  Diagnosis:   Malignant neoplasm of upper-outer quadrant of left breast    Indication for treatment:  Curative      Radiation treatment dates:   06/16/16-07/13/16  Site/dose:   1) Left breast / 40.05 Gy in 15 fx   2) Boost / 10 Gy in 10 Fx  Beams/energy:   1) 3D / 10 X, 6 X   2) Isodose Plan / 10X, 6X  Narrative: The patient tolerated radiation treatment relatively well.    Plan: The patient has completed radiation treatment. The patient will return to radiation oncology clinic for routine followup in one month. I advised them to call or return sooner if they have any questions or concerns related to their recovery or treatment.  -----------------------------------  [name], MD  This document serves as a record of services personally performed by [name], MD. It was created on her behalf by Bethann Humble, a trained medical scribe. The creation of this record is based on the scribe's personal observations and the provider's statements to them. This document has been checked and approved by the attending provider.

## 2016-07-15 NOTE — Telephone Encounter (Signed)
Pt called wanting to know if it is ok for pt to start Anastrozole since pt is taking Vit D supplement.   Called pt back and left message on voice mail that [facility] for pt to start Anastrozole while taking Vit D - as per Tivis Ringer, desk nurse. Pt's    Phone    [phone].

## 2016-07-16 ENCOUNTER — Ambulatory Visit: Payer: Medicare Other

## 2016-07-17 ENCOUNTER — Ambulatory Visit: Payer: Medicare Other

## 2016-07-20 ENCOUNTER — Ambulatory Visit: Payer: Medicare Other

## 2016-07-21 ENCOUNTER — Ambulatory Visit: Payer: Medicare Other

## 2016-07-21 ENCOUNTER — Telehealth: Payer: Self-pay | Admitting: Internal Medicine

## 2016-07-21 NOTE — Telephone Encounter (Signed)
Per office note in August 2017 "we will sit down and talk".  Please schedule an office visit not direct procedure.

## 2016-07-22 ENCOUNTER — Ambulatory Visit: Payer: Medicare Other

## 2016-07-23 ENCOUNTER — Ambulatory Visit: Payer: Medicare Other

## 2016-07-24 ENCOUNTER — Ambulatory Visit: Payer: Medicare Other

## 2016-07-27 ENCOUNTER — Ambulatory Visit: Payer: Medicare Other

## 2016-07-28 ENCOUNTER — Ambulatory Visit: Payer: Medicare Other

## 2016-07-29 ENCOUNTER — Ambulatory Visit: Payer: Medicare Other

## 2016-07-29 ENCOUNTER — Encounter: Payer: Self-pay | Admitting: Radiation Oncology

## 2016-07-30 ENCOUNTER — Ambulatory Visit: Payer: Medicare Other | Admitting: Nurse Practitioner

## 2016-07-30 ENCOUNTER — Encounter: Payer: Self-pay | Admitting: Nurse Practitioner

## 2016-07-30 ENCOUNTER — Encounter (INDEPENDENT_AMBULATORY_CARE_PROVIDER_SITE_OTHER): Payer: Self-pay

## 2016-07-30 ENCOUNTER — Ambulatory Visit (INDEPENDENT_AMBULATORY_CARE_PROVIDER_SITE_OTHER): Payer: Medicare Other | Admitting: Nurse Practitioner

## 2016-07-30 ENCOUNTER — Ambulatory Visit: Payer: Medicare Other

## 2016-07-30 ENCOUNTER — Encounter: Payer: Self-pay | Admitting: *Deleted

## 2016-07-30 VITALS — BP 136/64 | HR 70 | Ht 64.0 in | Wt 141.0 lb

## 2016-07-30 DIAGNOSIS — Z85038 Personal history of other malignant neoplasm of large intestine: Secondary | ICD-10-CM | POA: Diagnosis not present

## 2016-07-30 NOTE — Progress Notes (Signed)
     HPI: Patient is an 82 year old female known to Dr. Carlean Purl. She has a history remote history of colon cancer resected 25 years ago. She has a history of adenomatous colon polyps on last colonoscopy in 2012. Patient recently completed radiation for breast cancer for which she is status post lumpectomy. She was seen by Dr. Carlean Purl late August to talk about her surveillance colonoscopy. He felt it best to wait until after her radiation treatments for complete. She comes in today having completed radiation approximately 2 weeks ago. She feels great. Patient would like to proceed with her colonoscopy. She has no rectal bleeding, bowel changes or abdominal pain.  Past Medical History:  Diagnosis Date  . Breast cancer ([facility]) 2017   left breast  . Colon cancer ([place]) 1994  . Hx of adenomatous colonic polyps   . Varicose vein     Patient's surgical history, family medical history, social history, medications and allergies were all reviewed in Epic    Physical Exam: BP 136/64   Pulse 70   Ht 5\' 4"  (1.626 m)   Wt 141 lb (64 kg)   BMI 24.20 kg/m   GENERAL:  well-developed, white female in NAD PSYCH: :Pleasant, cooperative, normal affect HEENT: Normocephalic, conjunctiva pink, mucous membranes moist, neck supple without masses CARDIAC:  RRR, no murmur heard, no peripheral edema PULM: Normal respiratory effort, lungs CTA bilaterally, no wheezing ABDOMEN:  soft, nontender, nondistended, no obvious masses, no hepatomegaly,  normal bowel sounds SKIN:  turgor, no lesions seen NEURO: Alert and oriented x 3, no focal neurologic deficits   ASSESSMENT and PLAN:   82 year old female with hx of colon cancer, s/p resection 25 years ago. Patient wants to proceed with surveillance colonoscopy now that she has completed radiation treatment for breast cancer.  -Patient looks and feels well. No medical complaints. Patient will be scheduled for a colonoscopy with possible polypectomy.  The risks and  benefits of the procedure were discussed and the patient agrees to proceed.    Tye Savoy  07/30/2016, 1:54 PM

## 2016-07-30 NOTE — Patient Instructions (Signed)

## 2016-08-03 NOTE — Progress Notes (Signed)
Agree with Ms. Guenther's assessment and plan. Carl E. Gessner, MD, FACG   

## 2016-08-12 ENCOUNTER — Encounter: Payer: Medicare Other | Admitting: Internal Medicine

## 2016-08-17 ENCOUNTER — Ambulatory Visit (AMBULATORY_SURGERY_CENTER): Payer: Medicare Other | Admitting: Internal Medicine

## 2016-08-17 ENCOUNTER — Encounter: Payer: Self-pay | Admitting: Internal Medicine

## 2016-08-17 VITALS — BP 126/65 | HR 63 | Temp 98.0°F | Resp 10 | Ht 63.0 in | Wt 148.0 lb

## 2016-08-17 DIAGNOSIS — Z86010 Personal history of colonic polyps: Secondary | ICD-10-CM | POA: Diagnosis not present

## 2016-08-17 DIAGNOSIS — Z85038 Personal history of other malignant neoplasm of large intestine: Secondary | ICD-10-CM | POA: Diagnosis not present

## 2016-08-17 DIAGNOSIS — Z1211 Encounter for screening for malignant neoplasm of colon: Secondary | ICD-10-CM | POA: Diagnosis not present

## 2016-08-17 DIAGNOSIS — Z1212 Encounter for screening for malignant neoplasm of rectum: Secondary | ICD-10-CM | POA: Diagnosis not present

## 2016-08-17 MED ORDER — SODIUM CHLORIDE 0.9 % IV SOLN: 500.0000 mL | INTRAVENOUS | Status: DC

## 2016-08-17 NOTE — Progress Notes (Signed)
  [facility] Post-op Note  Patient: Teresa Boyd  Procedure(s) Performed: colonoscopy  Patient Location: [facility] - Recovery Area  Anesthesia Type: Deep Sedation/Propofol  Level of Consciousness: awake, oriented and patient cooperative  Airway and Oxygen Therapy: Patient Spontanous Breathing  Post-op Pain: none  Post-op Assessment:  Post-op Vital signs reviewed, Patient's Cardiovascular Status Stable, Respiratory Function Stable, Patent Airway, No signs of Nausea or vomiting and Pain level controlled  Post-op Vital Signs: Reviewed and stable  Complications: No apparent anesthesia complications  Kayne Yuhas E 11:48 AM

## 2016-08-17 NOTE — Patient Instructions (Addendum)
   No polyps today - looked normal.  I do not recommend any more routine repeat colonoscopy but will be available if needed.  I appreciate the opportunity to care for you. [name], MD, [facility]   Discharge instructions given. Normal exam. Resume previous medications. YOU HAD AN ENDOSCOPIC PROCEDURE TODAY AT [facility]:   Refer to the procedure report that was given to you for any specific questions about what was found during the examination.  If the procedure report does not answer your questions, please call your gastroenterologist to clarify.  If you requested that your care partner not be given the details of your procedure findings, then the procedure report has been included in a sealed envelope for you to review at your convenience later.  YOU SHOULD EXPECT: Some feelings of bloating in the abdomen. Passage of more gas than usual.  Walking can help get rid of the air that was put into your GI tract during the procedure and reduce the bloating. If you had a lower endoscopy (such as a colonoscopy or flexible sigmoidoscopy) you may notice spotting of blood in your stool or on the toilet paper. If you underwent a bowel prep for your procedure, you may not have a normal bowel movement for a few days.  Please Note:  You might notice some irritation and congestion in your nose or some drainage.  This is from the oxygen used during your procedure.  There is no need for concern and it should clear up in a day or so.  SYMPTOMS TO REPORT IMMEDIATELY:   Following lower endoscopy (colonoscopy or flexible sigmoidoscopy):  Excessive amounts of blood in the stool  Significant tenderness or worsening of abdominal pains  Swelling of the abdomen that is new, acute  Fever of 100F or higher  For urgent or emergent issues, a gastroenterologist can be reached at any hour by calling [phone].   DIET:  We do recommend a small meal at first, but then you may proceed to  your regular diet.  Drink plenty of fluids but you should avoid alcoholic beverages for 24 hours.  ACTIVITY:  You should plan to take it easy for the rest of today and you should NOT DRIVE or use heavy machinery until tomorrow (because of the sedation medicines used during the test).    FOLLOW UP: Our staff will call the number listed on your records the next business day following your procedure to check on you and address any questions or concerns that you may have regarding the information given to you following your procedure. If we do not reach you, we will leave a message.  However, if you are feeling well and you are not experiencing any problems, there is no need to return our call.  We will assume that you have returned to your regular daily activities without incident.  If any biopsies were taken you will be contacted by phone or by letter within the next 1-3 weeks.  Please call us at [phone] if you have not heard about the biopsies in 3 weeks.    SIGNATURES/CONFIDENTIALITY: You and/or your care partner have signed paperwork which will be entered into your electronic medical record.  These signatures attest to the fact that that the information above on your After Visit Summary has been reviewed and is understood.  Full responsibility of the confidentiality of this discharge information lies with you and/or your care-partner.

## 2016-08-17 NOTE — Op Note (Signed)
[facility] Patient Name: Teresa Boyd Procedure Date: 08/17/2016 11:13 AM MRN: [id number] Endoscopist: [name] , MD Age: 82 Referring MD:  Date of Birth: 05/23/1931 Gender: Female Account #: [id number] Procedure:                Colonoscopy Indications:              High risk colon cancer surveillance: Personal                            history of colon cancer, Personal history of                            colonic polyps Medicines:                Propofol per Anesthesia, Monitored Anesthesia Care Procedure:                Pre-Anesthesia Assessment:                           - Prior to the procedure, a History and Physical                            was performed, and patient medications and                            allergies were reviewed. The patient's tolerance of                            previous anesthesia was also reviewed. The risks                            and benefits of the procedure and the sedation                            options and risks were discussed with the patient.                            All questions were answered, and informed consent                            was obtained. Prior Anticoagulants: The patient has                            taken no previous anticoagulant or antiplatelet                            agents. ASA Grade Assessment: II - A patient with                            mild systemic disease. After reviewing the risks                            and benefits, the patient was deemed in  satisfactory condition to undergo the procedure.                           After obtaining informed consent, the colonoscope                            was passed under direct vision. Throughout the                            procedure, the patient's blood pressure, pulse, and                            oxygen saturations were monitored continuously. The                            Model PCF-H190L  [id number]) scope was introduced                            through the anus and advanced to the the cecum,                            identified by appendiceal orifice and ileocecal                            valve. The colonoscopy was performed without                            difficulty. The patient tolerated the procedure                            well. The quality of the bowel preparation was                            excellent. The bowel preparation used was Miralax.                            The ileocecal valve, appendiceal orifice, and                            rectum were photographed. Scope In: 11:27:28 AM Scope Out: 11:38:56 AM Scope Withdrawal Time: 0 hours 8 minutes 58 seconds  Total Procedure Duration: 0 hours 11 minutes 28 seconds  Findings:                 The perianal and digital rectal examinations were                            normal.                           The entire examined colon appeared normal on direct                            and retroflexion views. Complications:            No immediate complications. Estimated  Blood Loss:     Estimated blood loss: none. Impression:               - The entire examined colon is normal on direct and                            retroflexion views.                           - No specimens collected. Recommendation:           - Patient has a contact number available for                            emergencies. The signs and symptoms of potential                            delayed complications were discussed with the                            patient. Return to normal activities tomorrow.                            Written discharge instructions were provided to the                            patient.                           - Resume previous diet.                           - Continue present medications.                           - No repeat colonoscopy due to age. [name], MD 08/17/2016 11:46:11 AM This  report has been signed electronically.

## 2016-08-18 ENCOUNTER — Encounter: Payer: Self-pay | Admitting: Radiation Oncology

## 2016-08-18 ENCOUNTER — Telehealth: Payer: Self-pay | Admitting: *Deleted

## 2016-08-18 NOTE — Telephone Encounter (Signed)
  Follow up Call-  Call back number 08/17/2016  Post procedure Call Back phone  # [phone]  Permission to leave phone message Yes  Some recent data might be hidden     Patient questions:  Message states that the number is not in working order.

## 2016-08-21 ENCOUNTER — Ambulatory Visit
Admission: RE | Admit: 2016-08-21 | Discharge: 2016-08-21 | Disposition: A | Discharge status: Home / Self Care | Payer: Medicare Other | Source: Ambulatory Visit | Attending: Radiation Oncology | Admitting: Radiation Oncology

## 2016-08-21 ENCOUNTER — Encounter: Payer: Self-pay | Admitting: Radiation Oncology

## 2016-08-21 DIAGNOSIS — C50412 Malignant neoplasm of upper-outer quadrant of left female breast: Secondary | ICD-10-CM

## 2016-08-21 DIAGNOSIS — Z888 Allergy status to other drugs, medicaments and biological substances status: Secondary | ICD-10-CM | POA: Diagnosis not present

## 2016-08-21 DIAGNOSIS — Z880 Allergy status to penicillin: Secondary | ICD-10-CM | POA: Diagnosis not present

## 2016-08-21 DIAGNOSIS — Z881 Allergy status to other antibiotic agents status: Secondary | ICD-10-CM | POA: Diagnosis not present

## 2016-08-21 DIAGNOSIS — Z923 Personal history of irradiation: Secondary | ICD-10-CM | POA: Diagnosis not present

## 2016-08-21 DIAGNOSIS — Z79811 Long term (current) use of aromatase inhibitors: Secondary | ICD-10-CM | POA: Diagnosis not present

## 2016-08-21 HISTORY — DX: Personal history of irradiation: Z92.3

## 2016-08-21 NOTE — Progress Notes (Signed)
Teresa Boyd is here for follow up after treatment to her left breast.  She denies having pain.  She started Arimidex on 07/18/16.  She reports having a good energy level.  She reports that her skin is intact.  She continues to use radiaplex.  BP (!) 146/68 (BP Location: Right Arm, Patient Position: Sitting)   Pulse 73   Temp 98 F (36.7 C) (Oral)   Ht 5\' 3"  (1.6 m)   Wt 142 lb 9.6 oz (64.7 kg)   SpO2 100%   BMI 25.26 kg/m    Wt Readings from Last 3 Encounters:  08/21/16 142 lb 9.6 oz (64.7 kg)  08/17/16 148 lb (67.1 kg)  07/30/16 141 lb (64 kg)

## 2016-08-21 NOTE — Progress Notes (Signed)
   Wt Readings from Last 3 Encounters:  08/21/16 142 lb 9.6 oz (64.7 kg)  08/17/16 148 lb (67.1 kg)  07/30/16 141 lb (64 kg)

## 2016-08-21 NOTE — Progress Notes (Signed)
Radiation Oncology         [phone] ________________________________  Name: Teresa Boyd MRN: [id number]  Date: 08/21/2016  DOB: [birth year]  Follow-Up Visit Note  Outpatient  CC: Phineas Inches, MD  Magrinat, [name], MD  Diagnosis and Prior Radiotherapy:    ICD-9-CM ICD-10-CM   1. Malignant neoplasm of upper-outer quadrant of left female breast, unspecified estrogen receptor status ([place]) 174.4 C50.412     CHIEF COMPLAINT: Here for follow-up and surveillance of left breast cancer  Narrative:  The patient returns today for routine follow-up of radiation therapy completed to her left Breast on 07/13/16.  The patient denies pain. She reports starting Arimidex on 07/18/16. The patient reports a good energy level. Her skin is intact, and she reports continuing to use Radiaplex.                      ALLERGIES:  is allergic to sotradecol [sodium tetradecyl sulfate]; celebrex [celecoxib]; clindamycin; conjugated estrogens; estradiol benzoate; estrogens conjugated; hypaque-m [diatrizone sodium-diatrizone meglumine]; penicillins; and tramadol.  Meds: Current Outpatient Prescriptions  Medication Sig Dispense Refill  . anastrozole (ARIMIDEX) 1 MG tablet Take 1 tablet (1 mg total) by mouth daily. 90 tablet 4  . calcitonin, salmon, (MIACALCIN) 200 UNIT/ACT nasal spray 1 spray by Nasal route daily.      . Calcium Carbonate (CALCIUM-CARB 600 PO) Take by mouth.    . cetirizine (ZYRTEC) 10 MG tablet Take 10 mg by mouth daily.    . Cholecalciferol (VITAMIN D3) 1000 units CAPS Take by mouth.    . Grape Seed Extract 100 MG CAPS Take 400 each by mouth.    . Multiple Vitamin (MULTIVITAMIN) tablet Take 1 tablet by mouth daily.      . Omega-3 Fatty Acids (FISH OIL) 1000 MG CPDR Take by mouth.     Current Facility-Administered Medications  Medication Dose Route Frequency Provider Last Rate Last Dose  . 0.9 %  sodium chloride infusion  500 mL Intravenous Continuous [name], MD         Physical Findings: The patient is in no acute distress. Patient is alert and oriented.  height is 5\' 3"  (1.6 m) and weight is 142 lb 9.6 oz (64.7 kg). Her oral temperature is 98 F (36.7 C). Her blood pressure is 146/68 (abnormal) and her pulse is 73. Her oxygen saturation is 100%.   General: Alert and oriented, in no acute distress Psychiatric: Judgment and insight are intact. Affect is appropriate. Breast: On examination of the left breast, the skin has healed very well with minimal residual erythema.  Lab Findings: Lab Results  Component Value Date   WBC 3.2 (L) 06/26/2016   HGB 13.3 06/26/2016   HCT 41.0 06/26/2016   MCV 94.8 06/26/2016   PLT 190 06/26/2016    Radiographic Findings: No results found.  Impression/Plan:  I recommended the patient begin using Vitamin E   lotion when the Radiaplex runs out if she experiences dry or irritated skin, and to continue using it as long as she would like.  The patient will follow up with me as needed. I recommend she continue to participate in yearly mammogram screenings. The patient knows she can contact me with any questions or concerns she may have.  _____________________________________   [name], MD  This document serves as a record of services personally performed by [name], MD. It was created on her behalf by Maryla Morrow, a trained medical scribe. The creation of this record  is based on the scribe's personal observations and the provider's statements to them. This document has been checked and approved by the attending provider.

## 2016-08-27 ENCOUNTER — Other Ambulatory Visit (HOSPITAL_BASED_OUTPATIENT_CLINIC_OR_DEPARTMENT_OTHER): Payer: Medicare Other

## 2016-08-27 DIAGNOSIS — Z170 Estrogen receptor positive status [ER+]: Principal | ICD-10-CM

## 2016-08-27 DIAGNOSIS — C50412 Malignant neoplasm of upper-outer quadrant of left female breast: Secondary | ICD-10-CM | POA: Diagnosis not present

## 2016-08-27 LAB — CBC WITH DIFFERENTIAL/PLATELET
BASO%: 0.3 % (ref 0.0–2.0)
BASOS ABS: 0 10*3/uL (ref 0.0–0.1)
EOS ABS: 0.1 10*3/uL (ref 0.0–0.5)
EOS%: 4.1 % (ref 0.0–7.0)
HEMATOCRIT: 38.9 % (ref 34.8–46.6)
HGB: 12.7 g/dL (ref 11.6–15.9)
LYMPH#: 0.8 10*3/uL — AB (ref 0.9–3.3)
LYMPH%: 26.5 % (ref 14.0–49.7)
MCH: 31.1 pg (ref 25.1–34.0)
MCHC: 32.6 g/dL (ref 31.5–36.0)
MCV: 95.3 fL (ref 79.5–101.0)
MONO#: 0.3 10*3/uL (ref 0.1–0.9)
MONO%: 10.5 % (ref 0.0–14.0)
NEUT#: 1.7 10*3/uL (ref 1.5–6.5)
NEUT%: 58.6 % (ref 38.4–76.8)
Platelets: 171 10*3/uL (ref 145–400)
RBC: 4.08 10*6/uL (ref 3.70–5.45)
RDW: 13.9 % (ref 11.2–14.5)
WBC: 2.9 10*3/uL — ABNORMAL LOW (ref 3.9–10.3)

## 2016-08-27 LAB — COMPREHENSIVE METABOLIC PANEL
ALK PHOS: 71 U/L (ref 40–150)
ALT: 14 U/L (ref 0–55)
ANION GAP: 9 meq/L (ref 3–11)
AST: 19 U/L (ref 5–34)
Albumin: 3.5 g/dL (ref 3.5–5.0)
BUN: 16.7 mg/dL (ref 7.0–26.0)
CALCIUM: 9.2 mg/dL (ref 8.4–10.4)
CHLORIDE: 105 meq/L (ref 98–109)
CO2: 27 mEq/L (ref 22–29)
Creatinine: 0.8 mg/dL (ref 0.6–1.1)
EGFR: 74 mL/min/{1.73_m2} — AB (ref >90)
Glucose: 136 mg/dl (ref 70–140)
POTASSIUM: 4 meq/L (ref 3.5–5.1)
Sodium: 141 mEq/L (ref 136–145)
Total Bilirubin: 0.63 mg/dL (ref 0.20–1.20)
Total Protein: 7 g/dL (ref 6.4–8.3)

## 2016-08-28 LAB — VITAMIN D 25 HYDROXY (VIT D DEFICIENCY, FRACTURES): VIT D 25 HYDROXY: 38.9 ng/mL (ref 30.0–100.0)

## 2016-09-03 ENCOUNTER — Ambulatory Visit (HOSPITAL_BASED_OUTPATIENT_CLINIC_OR_DEPARTMENT_OTHER): Payer: Medicare Other | Admitting: Oncology

## 2016-09-03 VITALS — BP 158/78 | HR 65 | Temp 97.4°F | Resp 17 | Ht 63.0 in | Wt 141.3 lb

## 2016-09-03 DIAGNOSIS — Z170 Estrogen receptor positive status [ER+]: Secondary | ICD-10-CM

## 2016-09-03 DIAGNOSIS — M810 Age-related osteoporosis without current pathological fracture: Secondary | ICD-10-CM

## 2016-09-03 DIAGNOSIS — C50412 Malignant neoplasm of upper-outer quadrant of left female breast: Secondary | ICD-10-CM

## 2016-09-03 NOTE — Progress Notes (Signed)
[facility]  [phone] [phone]     ID: PATI THINNES DOB: Jul 17, 1943  MR#: [id number]  RKY#:[id number]  Patient Care Team: [name], MD as PCP - General (Family Medicine) [name], MD as Consulting Physician (General Surgery) [name], MD as Consulting Physician (Gastroenterology) Chauncey Cruel, MD as Consulting Physician (Oncology) [name], MD as Attending Physician (Radiation Oncology) [name], MD as Consulting Physician (Family Medicine) [name], MD as Consulting Physician (Obstetrics and Gynecology) [name], MD as Consulting Physician (Orthopedic Surgery) OTHER MD:  CHIEF COMPLAINT: Estrogen receptor positive invasive breast cancer  CURRENT TREATMENT: Anastrozole   BREAST CANCER HISTORY: From the original intake note:  Mayrin herself palpated a mass in her left breast mid-June 2017. She brought it to the attention of Dr. Coletta Memos who obtained screening mammography showing possible abnormalities in both breasts. Laraina was then referred to the [facility] where mammography and ultrasonography 03/18/2015 showed in the right breast an 8 mm spiculated mass at the 11:00 position 5 cm from the nipple and a second area of altered architecture also in the upper outer quadrant. In the left breast there was a 2.5 cm is spiculated mass in the upper outer quadrant 4 cm from the nipple. Ultrasound of both axillae were negative.  On 03/17/2016 the patient underwent biopsy of all 3 suspicious areas. In the right breast, both areas showed only fibrocystic change, with no evidence of malignancy ([id number] [id number] and [id number]). This was read as concordant by radiology.  In the left breast however the final pathology (SAA [id number]) showed an invasive ductal carcinoma, grade 2 or 3, estrogen receptor 5% positive, with moderate staining intensity, progesterone receptor negative, with an MIB-1 of 5%, and no HER-2 amplification, the  signals ratio being 1.53 and the number per cell 3.45.  Murdis also has a remote history of colon cancer, in remission for more than 10 years with the most recent colonoscopy 2012 negative  INTERVAL HISTORY: Gizzelle returns today for follow-up of her estrogen receptor positive breast cancer. This will last visit here she completed her radiation treatments. She did well with dose, with no significant fatigue, and only minimal erythema. She then started anastrozole.  She is tolerating anastrozole with no significant side effects that she is aware of. She's had minimal hot flashes, no change in vaginal dryness. She is currently obtaining the drug at no cost.  REVIEW OF SYSTEMS: She continues to be very active, working and getting around until usually 9 PM or so. She is looking forward to the holidays with her family. A detailed review of systems today was otherwise stable  PAST MEDICAL HISTORY: Past Medical History:  Diagnosis Date  . Breast cancer ([place]) 2017   left breast  . Colon cancer ([place]) 1994  . History of radiation therapy 06/16/16- 07/13/16   Left Breast  . Hx of adenomatous colonic polyps   . Varicose vein     PAST SURGICAL HISTORY: Past Surgical History:  Procedure Laterality Date  . ABDOMINAL HYSTERECTOMY    . COLON RESECTION     sigmoid  . COLON SURGERY  1994   colon cancer  . COLONOSCOPY W/ POLYPECTOMY  last 02/18/11   multiple, prior colon cancer and polyps, 7 mm adenoma 2012  . OVARIAN CYST SURGERY    . PARTIAL MASTECTOMY WITH NEEDLE LOCALIZATION Left 04/22/2016   Procedure: LEFT PARTIAL MASTECTOMY WITH DOUBLE NEEDLE LOCALIZATION REEXCISION INFERIOR AND LATERAL MARGIN ADJACENT TISSUE TRANSFER;  Surgeon: [name], MD;  Location: [facility];  Service: General;  Laterality: Left;  . TONSILLECTOMY      FAMILY HISTORY Family History  Problem Relation Age of Onset  . Colon cancer Mother 63  . Colon polyps Father 3  . Pancreatic cancer Neg Hx   .  Rectal cancer Neg Hx   . Stomach cancer Neg Hx   The patient's father died at age 80 from postoperative complications in the setting of peptic ulcer disease. The patient's mother died at age 80 with colon cancer. The patient had 5 brothers, 2 sisters. One brother died in an automobile accident. One brother has colitis. A maternal aunt developed cancer in her "female organs" in her 1s. The patient has no further information regarding this. She is not aware of any other cancers in the family aside of course from her own remote colon cancer.  GYNECOLOGIC HISTORY:  No LMP recorded. Patient has had a hysterectomy. Menarche age 44, first live birth age 55. The patient is [place] P1 area in 1955 she underwent total abdominal hysterectomy with bilateral salpingo-oophorectomy. She tried hormone replacement briefly (less than a year). She never took oral contraceptives.  SOCIAL HISTORY:  Briseis still actively manages rental properties. She is widowed and lives by herself with no pets. Her son CAIYA BETTES is a pulmonologist in [place], [place]. The patient has 4 grandchildren, and no great grandchildren. She attends [employer] locally. 3 of her siblings live in town including of course her sister Joycelyn Schmid, who is a retired Rec[employer] consultant    ADVANCED DIRECTIVES:At the 03/27/2016 visit the patient was given the appropriate forms to complete and notarize at her discretion. She intends to name her son Antony Haste as healthcare power of attorney. He can be reached at [phone]  HEALTH MAINTENANCE: Social History  Substance Use Topics  . Smoking status: Never Smoker  . Smokeless tobacco: Never Used  . Alcohol use No     Colonoscopy:02/18/2011, [facility]; tubular adenoma with no high-grade dysplasia  Bone density: [facility], 01/08/2010, T score -2.2, decreased from prior   Allergies  Allergen Reactions  . Sotradecol [Sodium Tetradecyl Sulfate] Anaphylaxis  . Celebrex [Celecoxib]   . Clindamycin   .  Conjugated Estrogens   . Estradiol Benzoate Other (See Comments)    Caused fast heart rate  . Estrogens Conjugated   . Hypaque-M [Diatrizone Sodium-Diatrizone Meglumine] Hives    Ct scan IVP dye  . Penicillins Hives  . Tramadol Nausea Only    Current Outpatient Prescriptions  Medication Sig Dispense Refill  . anastrozole (ARIMIDEX) 1 MG tablet Take 1 tablet (1 mg total) by mouth daily. 90 tablet 4  . calcitonin, salmon, (MIACALCIN) 200 UNIT/ACT nasal spray 1 spray by Nasal route daily.      . Calcium Carbonate (CALCIUM-CARB 600 PO) Take by mouth.    . cetirizine (ZYRTEC) 10 MG tablet Take 10 mg by mouth daily.    . Cholecalciferol (VITAMIN D3) 1000 units CAPS Take by mouth.    . Grape Seed Extract 100 MG CAPS Take 400 each by mouth.    . Multiple Vitamin (MULTIVITAMIN) tablet Take 1 tablet by mouth daily.      . Omega-3 Fatty Acids (FISH OIL) 1000 MG CPDR Take by mouth.     Current Facility-Administered Medications  Medication Dose Route Frequency Provider Last Rate Last Dose  . 0.9 %  sodium chloride infusion  500 mL Intravenous Continuous [name], MD        OBJECTIVE:  Older white woman who appears well  Vitals:   09/03/16 1334  BP: (!) 158/78  Pulse: 65  Resp: 17  Temp: 97.4 F (36.3 C)     Body mass index is 25.03 kg/m.    ECOG FS:0 - Asymptomatic  Sclerae unicteric, pupils round and equal Oropharynx clear and moist-- no thrush or other lesions No cervical or supraclavicular adenopathy Lungs no rales or rhonchi Heart regular rate and rhythm Abd soft, nontender, positive bowel sounds MSK no focal spinal tenderness, no upper extremity lymphedema Neuro: nonfocal, well oriented, appropriate affect Breasts: The right breast is unremarkable. The left breast is status post lumpectomy followed by radiation. There is minimal residual erythema. There is no desquamation. There is no evidence of local recurrence. The left axilla is benign.   LAB RESULTS:  CMP       Component Value Date/Time   NA 141 08/27/2016 1247   K 4.0 08/27/2016 1247   CL 107 12/29/2006 1210   CO2 27 08/27/2016 1247   GLUCOSE 136 08/27/2016 1247   BUN 16.7 08/27/2016 1247   CREATININE 0.8 08/27/2016 1247   CALCIUM 9.2 08/27/2016 1247   PROT 7.0 08/27/2016 1247   ALBUMIN 3.5 08/27/2016 1247   AST 19 08/27/2016 1247   ALT 14 08/27/2016 1247   ALKPHOS 71 08/27/2016 1247   BILITOT 0.63 08/27/2016 1247   GFRNONAA 87 12/29/2006 1210   GFRAA 105 12/29/2006 1210    INo results found for: SPEP, UPEP  Lab Results  Component Value Date   WBC 2.9 (L) 08/27/2016   NEUTROABS 1.7 08/27/2016   HGB 12.7 08/27/2016   HCT 38.9 08/27/2016   MCV 95.3 08/27/2016   PLT 171 08/27/2016      Chemistry      Component Value Date/Time   NA 141 08/27/2016 1247   K 4.0 08/27/2016 1247   CL 107 12/29/2006 1210   CO2 27 08/27/2016 1247   BUN 16.7 08/27/2016 1247   CREATININE 0.8 08/27/2016 1247      Component Value Date/Time   CALCIUM 9.2 08/27/2016 1247   ALKPHOS 71 08/27/2016 1247   AST 19 08/27/2016 1247   ALT 14 08/27/2016 1247   BILITOT 0.63 08/27/2016 1247       No results found for: LABCA2  No components found for: LABCA125  No results for input(s): INR in the last 168 hours.  Urinalysis No results found for: COLORURINE, APPEARANCEUR, LABSPEC, PHURINE, GLUCOSEU, HGBUR, BILIRUBINUR, KETONESUR, PROTEINUR, UROBILINOGEN, NITRITE, LEUKOCYTESUR   STUDIES: No results found.  ELIGIBLE FOR AVAILABLE RESEARCH PROTOCOL: PALLAS  ASSESSMENT: 82 y.o. [place] woman status post left breast upper outer quadrant biopsy 03/17/2016 for a clinical T2 N0, stage IIa invasive ductal carcinoma, grade 2 or 3, minimally estrogen receptor positive at 5% with moderate staining intensity, progesterone receptor and HER-2 negative, with an MIB-1 of 5%  (1) Status post left Lumpectomy without sentinel lymph node sampling 04/22/2016 for apT2 pNX, stage II   invasive ductal carcinoma, grade 3,  with negative margins  (a) repeat prognostic panel pending  (2) Mammaprint results "high risk" suggests a risk of recurrence with no systemic treatment of 29%   (a) patient offered adjuvant CMF chemotherapy but opted against it   (3) adjuvant radiation 06/16/16-07/13/16 Site/dose:   1) Left breast / 40.05 Gy in 15 fx                         2) Boost / 10 Gy in 10  Fx  (4) started anastrozole 07/22/2016  (a) DEXA scan 06/02/2016 shows osteoporosis, with a T score of -2.7  PLAN: Janelle Is tolerating anastrozole with no significant side effects and the plan will be to continue that for a total of 5 years.  She does have osteoporosis. I suggested she consider denosumab/Prolia, and I gave her written information on this to discuss with her son. If she agrees to do and I will be setting her up for it. She understands the risks of hypocalcemia, bony aches and pains, and a rare incident of osteonecrosis of the jaw as well as other less common side effects.  At this point I feel comfortable widening of the follow-up interval. I will see her again in May. If all is going well at that point we will consider yearly follow-up.  She knows to call for any other problems that may develop before that visit  :Chauncey Cruel, MD   09/03/2016 7:01 PM Medical Oncology and Hematology [facility] [address] [place], [place] [address] Tel. [phone]    Fax. [phone]

## 2016-10-13 ENCOUNTER — Other Ambulatory Visit: Payer: Self-pay | Admitting: Oncology

## 2016-10-20 ENCOUNTER — Ambulatory Visit (HOSPITAL_BASED_OUTPATIENT_CLINIC_OR_DEPARTMENT_OTHER): Payer: Medicare Other

## 2016-10-20 VITALS — BP 135/65 | HR 70 | Temp 98.0°F | Resp 18

## 2016-10-20 DIAGNOSIS — M810 Age-related osteoporosis without current pathological fracture: Secondary | ICD-10-CM

## 2016-10-20 MED ORDER — DENOSUMAB 60 MG/ML ~~LOC~~ SOLN
60.0000 mg | Freq: Once | SUBCUTANEOUS | Status: AC
  Administered 2016-10-20: 60 mg via SUBCUTANEOUS
  Filled 2016-10-20: qty 1

## 2016-10-20 NOTE — Patient Instructions (Signed)
Denosumab injection What is this medicine? DENOSUMAB (den oh sue mab) slows bone breakdown. Prolia is used to treat osteoporosis in women after menopause and in men. Xgeva is used to prevent bone fractures and other bone problems caused by cancer bone metastases. Xgeva is also used to treat giant cell tumor of the bone. COMMON BRAND NAME(S): Prolia, XGEVA What should I tell my health care provider before I take this medicine? They need to know if you have any of these conditions: -dental disease -eczema -infection or history of infections -kidney disease or on dialysis -low blood calcium or vitamin D -malabsorption syndrome -scheduled to have surgery or tooth extraction -taking medicine that contains denosumab -thyroid or parathyroid disease -an unusual reaction to denosumab, other medicines, foods, dyes, or preservatives -pregnant or trying to get pregnant -breast-feeding How should I use this medicine? This medicine is for injection under the skin. It is given by a health care professional in a hospital or clinic setting. If you are getting Prolia, a special MedGuide will be given to you by the pharmacist with each prescription and refill. Be sure to read this information carefully each time. For Prolia, talk to your pediatrician regarding the use of this medicine in children. Special care may be needed. For Xgeva, talk to your pediatrician regarding the use of this medicine in children. While this drug may be prescribed for children as young as 13 years for selected conditions, precautions do apply. What if I miss a dose? It is important not to miss your dose. Call your doctor or health care professional if you are unable to keep an appointment. What may interact with this medicine? Do not take this medicine with any of the following medications: -other medicines containing denosumab This medicine may also interact with the following medications: -medicines that suppress the immune  system -medicines that treat cancer -steroid medicines like prednisone or cortisone What should I watch for while using this medicine? Visit your doctor or health care professional for regular checks on your progress. Your doctor or health care professional may order blood tests and other tests to see how you are doing. Call your doctor or health care professional if you get a cold or other infection while receiving this medicine. Do not treat yourself. This medicine may decrease your body's ability to fight infection. You should make sure you get enough calcium and vitamin D while you are taking this medicine, unless your doctor tells you not to. Discuss the foods you eat and the vitamins you take with your health care professional. See your dentist regularly. Brush and floss your teeth as directed. Before you have any dental work done, tell your dentist you are receiving this medicine. Do not become pregnant while taking this medicine or for 5 months after stopping it. Women should inform their doctor if they wish to become pregnant or think they might be pregnant. There is a potential for serious side effects to an unborn child. Talk to your health care professional or pharmacist for more information. What side effects may I notice from receiving this medicine? Side effects that you should report to your doctor or health care professional as soon as possible: -allergic reactions like skin rash, itching or hives, swelling of the face, lips, or tongue -breathing problems -chest pain -fast, irregular heartbeat -feeling faint or lightheaded, falls -fever, chills, or any other sign of infection -muscle spasms, tightening, or twitches -numbness or tingling -skin blisters or bumps, or is dry, peels, or red -slow   healing or unexplained pain in the mouth or jaw -unusual bleeding or bruising Side effects that usually do not require medical attention (report to your doctor or health care professional  if they continue or are bothersome): -muscle pain -stomach upset, gas Where should I keep my medicine? This medicine is only given in a clinic, doctor's office, or other health care setting and will not be stored at home.  2017 Elsevier/Gold Standard (2015-10-10 10:06:55)  

## 2016-12-07 ENCOUNTER — Telehealth: Payer: Self-pay | Admitting: Adult Health

## 2016-12-07 NOTE — Telephone Encounter (Signed)
Patient aware of Survivorship appt added. Sent out reminder letter .

## 2017-02-04 ENCOUNTER — Other Ambulatory Visit (HOSPITAL_BASED_OUTPATIENT_CLINIC_OR_DEPARTMENT_OTHER): Payer: Medicare Other

## 2017-02-04 ENCOUNTER — Telehealth: Payer: Self-pay | Admitting: Oncology

## 2017-02-04 ENCOUNTER — Ambulatory Visit (HOSPITAL_BASED_OUTPATIENT_CLINIC_OR_DEPARTMENT_OTHER): Payer: Medicare Other | Admitting: Oncology

## 2017-02-04 VITALS — BP 150/59 | HR 65 | Temp 97.9°F | Resp 17 | Ht 63.0 in | Wt 142.5 lb

## 2017-02-04 DIAGNOSIS — Z170 Estrogen receptor positive status [ER+]: Secondary | ICD-10-CM

## 2017-02-04 DIAGNOSIS — M810 Age-related osteoporosis without current pathological fracture: Secondary | ICD-10-CM

## 2017-02-04 DIAGNOSIS — C50412 Malignant neoplasm of upper-outer quadrant of left female breast: Secondary | ICD-10-CM | POA: Diagnosis present

## 2017-02-04 DIAGNOSIS — Z85038 Personal history of other malignant neoplasm of large intestine: Secondary | ICD-10-CM

## 2017-02-04 LAB — COMPREHENSIVE METABOLIC PANEL
ALT: 17 U/L (ref 0–55)
AST: 21 U/L (ref 5–34)
Albumin: 3.9 g/dL (ref 3.5–5.0)
Alkaline Phosphatase: 46 U/L (ref 40–150)
Anion Gap: 6 mEq/L (ref 3–11)
BUN: 12.3 mg/dL (ref 7.0–26.0)
CHLORIDE: 107 meq/L (ref 98–109)
CO2: 27 mEq/L (ref 22–29)
Calcium: 9.3 mg/dL (ref 8.4–10.4)
Creatinine: 0.7 mg/dL (ref 0.6–1.1)
EGFR: 77 mL/min/{1.73_m2} — ABNORMAL LOW (ref >90)
GLUCOSE: 98 mg/dL (ref 70–140)
POTASSIUM: 4.2 meq/L (ref 3.5–5.1)
Sodium: 140 mEq/L (ref 136–145)
Total Bilirubin: 0.72 mg/dL (ref 0.20–1.20)
Total Protein: 7 g/dL (ref 6.4–8.3)

## 2017-02-04 LAB — CBC WITH DIFFERENTIAL/PLATELET
BASO%: 0.3 % (ref 0.0–2.0)
BASOS ABS: 0 10*3/uL (ref 0.0–0.1)
EOS%: 4.6 % (ref 0.0–7.0)
Eosinophils Absolute: 0.2 10*3/uL (ref 0.0–0.5)
HEMATOCRIT: 40.2 % (ref 34.8–46.6)
HGB: 13.1 g/dL (ref 11.6–15.9)
LYMPH#: 1.1 10*3/uL (ref 0.9–3.3)
LYMPH%: 32.8 % (ref 14.0–49.7)
MCH: 31.5 pg (ref 25.1–34.0)
MCHC: 32.6 g/dL (ref 31.5–36.0)
MCV: 96.6 fL (ref 79.5–101.0)
MONO#: 0.3 10*3/uL (ref 0.1–0.9)
MONO%: 8.8 % (ref 0.0–14.0)
NEUT#: 1.8 10*3/uL (ref 1.5–6.5)
NEUT%: 53.5 % (ref 38.4–76.8)
Platelets: 183 10*3/uL (ref 145–400)
RBC: 4.16 10*6/uL (ref 3.70–5.45)
RDW: 14 % (ref 11.2–14.5)
WBC: 3.3 10*3/uL — ABNORMAL LOW (ref 3.9–10.3)

## 2017-02-04 NOTE — Progress Notes (Signed)
[facility]  Telephone:(336) [phone] Fax:(336) [phone]     ID: Teresa Boyd DOB: 1931/05/13  MR#: [id number]  GGY#:[id number]  Patient Care Team: [name], MD as PCP - General (Family Medicine) [name], MD as Consulting Physician (General Surgery) [name], MD as Consulting Physician (Gastroenterology) Magrinat, [name], MD as Consulting Physician (Oncology) [name], MD as Attending Physician (Radiation Oncology) [name], MD as Consulting Physician (Family Medicine) [name], MD as Consulting Physician (Obstetrics and Gynecology) [name], MD as Consulting Physician (Orthopedic Surgery) OTHER MD:  CHIEF COMPLAINT: Estrogen receptor positive invasive breast cancer  CURRENT TREATMENT: Anastrozole, denosumab/Prolia   BREAST CANCER HISTORY: From the original intake note:  Teresa Boyd herself palpated a mass in her left breast mid-June 2017. She brought it to the attention of Dr. Coletta Memos who obtained screening mammography showing possible abnormalities in both breasts. Teresa Boyd was then referred to the [facility] where mammography and ultrasonography 03/18/2015 showed in the right breast an 8 mm spiculated mass at the 11:00 position 5 cm from the nipple and a second area of altered architecture also in the upper outer quadrant. In the left breast there was a 2.5 cm is spiculated mass in the upper outer quadrant 4 cm from the nipple. Ultrasound of both axillae were negative.  On 03/17/2016 the patient underwent biopsy of all 3 suspicious areas. In the right breast, both areas showed only fibrocystic change, with no evidence of malignancy ([id number]). This was read as concordant by radiology.  In the left breast however the final pathology ([id number]) showed an invasive ductal carcinoma, grade 2 or 3, estrogen receptor 5% positive, with moderate staining intensity, progesterone receptor negative, with an MIB-1 of 5%, and  no HER-2 amplification, the signals ratio being 1.53 and the number per cell 3.45.  Teresa Boyd also has a remote history of colon cancer, in remission for more than 10 years with the most recent colonoscopy 2012 negative  INTERVAL HISTORY: Teresa Boyd returns today for follow-up of her estrogen receptor positive breast cancer. She continues on anastrozole, with excellent tolerance.   Hot flashes and vaginal dryness are not a major issue. She never developed the arthralgias or myalgias that many patients can experience on this medication. She obtains it at a good price.  She received her first denosumab/Prolia dose 10/20/2016. She does not recall any side effects from that whatsoever.  REVIEW OF SYSTEMS: She exercises chiefly by doing yard work. A detailed review of systems today was otherwise noncontributory  PAST MEDICAL HISTORY: Past Medical History:  Diagnosis Date  . Breast cancer ([facility]) 2017   left breast  . Colon cancer ([facility]) 1994  . History of radiation therapy 06/16/16- 07/13/16   Left Breast  . Hx of adenomatous colonic polyps   . Varicose vein     PAST SURGICAL HISTORY: Past Surgical History:  Procedure Laterality Date  . ABDOMINAL HYSTERECTOMY    . COLON RESECTION     sigmoid  . COLON SURGERY  1994   colon cancer  . COLONOSCOPY W/ POLYPECTOMY  last 02/18/11   multiple, prior colon cancer and polyps, 7 mm adenoma 2012  . OVARIAN CYST SURGERY    . PARTIAL MASTECTOMY WITH NEEDLE LOCALIZATION Left 04/22/2016   Procedure: LEFT PARTIAL MASTECTOMY WITH DOUBLE NEEDLE LOCALIZATION REEXCISION INFERIOR AND LATERAL MARGIN ADJACENT TISSUE TRANSFER;  Surgeon: [name], MD;  Location: [facility];  Service: General;  Laterality: Left;  . TONSILLECTOMY  FAMILY HISTORY Family History  Problem Relation Age of Onset  . Colon cancer Mother 52  . Colon polyps Father 63  . Pancreatic cancer Neg Hx   . Rectal cancer Neg Hx   . Stomach cancer Neg Hx   The patient's father  died at age 39 from postoperative complications in the setting of peptic ulcer disease. The patient's mother died at age 39 with colon cancer. The patient had 5 brothers, 2 sisters. One brother died in an automobile accident. One brother has colitis. A maternal aunt developed cancer in her "female organs" in her 8s. The patient has no further information regarding this. She is not aware of any other cancers in the family aside of course from her own remote colon cancer.  GYNECOLOGIC HISTORY:  No LMP recorded. Patient has had a hysterectomy. Menarche age 3, first live birth age 20. The patient is [place] in 1955 she underwent total abdominal hysterectomy with bilateral salpingo-oophorectomy. She tried hormone replacement briefly (less than a year). She never took oral contraceptives.  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 She is widowed and lives by herself with no pets. Her son Teresa Boyd is a pulmonologist in [place], [place]. The patient has 4 grandchildren, and no great grandchildren. She attends [employer] locally. 3 of her siblings live in town including of course her sister Teresa Boyd, who is a retired [employer] consultant    ADVANCED DIRECTIVES:At the 03/27/2016 visit the patient was given the appropriate forms to complete and notarize at her discretion. She intends to name her son Teresa Boyd as healthcare power of attorney. He can be reached at [phone]  HEALTH MAINTENANCE: Social History  Substance Use Topics  . Smoking status: Never Smoker  . Smokeless tobacco: Never Used  . Alcohol use No     Colonoscopy:02/18/2011, [facility]; tubular adenoma with no high-grade dysplasia  Bone density: [facility], 01/08/2010, T score -2.2, decreased from prior   Allergies  Allergen Reactions  . Sotradecol [Sodium Tetradecyl Sulfate] Anaphylaxis  . Celebrex [Celecoxib]   . Clindamycin   . Conjugated Estrogens   . Estradiol Benzoate Other (See Comments)     Caused fast heart rate  . Estrogens Conjugated   . Hypaque-M [Diatrizone Sodium-Diatrizone Meglumine] Hives    Ct scan IVP dye  . Penicillins Hives  . Tramadol Nausea Only    Current Outpatient Prescriptions  Medication Sig Dispense Refill  . anastrozole (ARIMIDEX) 1 MG tablet Take 1 tablet (1 mg total) by mouth daily. 90 tablet 4  . calcitonin, salmon, (MIACALCIN) 200 UNIT/ACT nasal spray 1 spray by Nasal route daily.      . Calcium Carbonate (CALCIUM-CARB 600 PO) Take by mouth.    . cetirizine (ZYRTEC) 10 MG tablet Take 10 mg by mouth daily.    . Cholecalciferol (VITAMIN D3) 1000 units CAPS Take by mouth.    . Grape Seed Extract 100 MG CAPS Take 400 each by mouth.    . Multiple Vitamin (MULTIVITAMIN) tablet Take 1 tablet by mouth daily.      . Omega-3 Fatty Acids (FISH OIL) 1000 MG CPDR Take by mouth.     Current Facility-Administered Medications  Medication Dose Route Frequency Provider Last Rate Last Dose  . 0.9 %  sodium chloride infusion  500 mL Intravenous Continuous [name], MD        OBJECTIVE: Older white woman In no acute distress  Vitals:   02/04/17 1308  BP: (!) 150/59  Pulse:  65  Resp: 17  Temp: 97.9 F (36.6 C)     Body mass index is 25.24 kg/m.    ECOG FS:0 - Asymptomatic  Sclerae unicteric, EOMs intact Oropharynx clear and moist No cervical or supraclavicular adenopathy Lungs no rales or rhonchi Heart regular rate and rhythm Abd soft, nontender, positive bowel sounds MSK no focal spinal tenderness, no upper extremity lymphedema Neuro: nonfocal, well oriented, appropriate affect Breasts: The right breast is benign. The left breast is status post lumpectomy and radiation. The nipple retraction is less marked. There is no evidence of recurrence. Both axillae are benign.  LAB RESULTS:  CMP     Component Value Date/Time   NA 141 08/27/2016 1247   K 4.0 08/27/2016 1247   CL 107 12/29/2006 1210   CO2 27 08/27/2016 1247   GLUCOSE 136 08/27/2016  1247   BUN 16.7 08/27/2016 1247   CREATININE 0.8 08/27/2016 1247   CALCIUM 9.2 08/27/2016 1247   PROT 7.0 08/27/2016 1247   ALBUMIN 3.5 08/27/2016 1247   AST 19 08/27/2016 1247   ALT 14 08/27/2016 1247   ALKPHOS 71 08/27/2016 1247   BILITOT 0.63 08/27/2016 1247   GFRNONAA 87 12/29/2006 1210   GFRAA 105 12/29/2006 1210    INo results found for: SPEP, UPEP  Lab Results  Component Value Date   WBC 3.3 (L) 02/04/2017   NEUTROABS 1.8 02/04/2017   HGB 13.1 02/04/2017   HCT 40.2 02/04/2017   MCV 96.6 02/04/2017   PLT 183 02/04/2017      Chemistry      Component Value Date/Time   NA 141 08/27/2016 1247   K 4.0 08/27/2016 1247   CL 107 12/29/2006 1210   CO2 27 08/27/2016 1247   BUN 16.7 08/27/2016 1247   CREATININE 0.8 08/27/2016 1247      Component Value Date/Time   CALCIUM 9.2 08/27/2016 1247   ALKPHOS 71 08/27/2016 1247   AST 19 08/27/2016 1247   ALT 14 08/27/2016 1247   BILITOT 0.63 08/27/2016 1247       No results found for: LABCA2  No components found for: LABCA125  No results for input(s): INR in the last 168 hours.  Urinalysis No results found for: COLORURINE, APPEARANCEUR, LABSPEC, PHURINE, GLUCOSEU, HGBUR, BILIRUBINUR, KETONESUR, PROTEINUR, UROBILINOGEN, NITRITE, LEUKOCYTESUR   STUDIES: We will try to move her mammography to early December  ELIGIBLE FOR AVAILABLE RESEARCH PROTOCOL: [facility]  ASSESSMENT: 83 y.o. [place] woman status post left breast upper outer quadrant biopsy 03/17/2016 for a clinical T2 N0, stage IIa invasive ductal carcinoma, grade 2 or 3, minimally estrogen receptor positive at 5% with moderate staining intensity, progesterone receptor and HER-2 negative, with an MIB-1 of 5%  (1) Status post left Lumpectomy without sentinel lymph node sampling 04/22/2016 for apT2 pNX, stage II   invasive ductal carcinoma, grade 3, with negative margins  (a) repeat prognostic panel pending  (2) Mammaprint results "high risk" suggests a risk of  recurrence with no systemic treatment of 29%   (a) patient offered adjuvant CMF chemotherapy but opted against it   (3) adjuvant radiation 06/16/16-07/13/16 Site/dose:   1) Left breast / 40.05 Gy in 15 fx                         2) Boost / 10 Gy in 10 Fx  (4) started anastrozole 07/22/2016  (a) DEXA scan 06/02/2016 shows osteoporosis, with a T score of -2.7  (b) denosumab Prolia started 10/20/2016  PLAN: Teresa Boyd is now just about a year out from definitive surgery for her breast cancer with no evidence of disease recurrence. This is favorable.  She is tolerating anastrozole remarkably well. The plan will be to continue that for a total of 5 years.  She is also having no side effects from the denosumab/Prolia. She will receive her next dose the last week in June and then the third week in December. She will have lab work with each one of those doses.  She will see me again in a year. She knows to call for any problems that may develop before her next visit here. :Chauncey Cruel, MD   02/04/2017 1:13 PM Medical Oncology and Hematology [facility] [place], [place] [address] Tel. [phone]    Fax. [phone]

## 2017-02-04 NOTE — Telephone Encounter (Signed)
Prolia Injections was scheduled for 03/16/17 & 09/07/17, per 02/04/17 los. Labs with follow up also scheduled for 1 year with Dr Jana Hakim, per 02/04/17 los.

## 2017-03-16 ENCOUNTER — Other Ambulatory Visit: Payer: Self-pay | Admitting: Oncology

## 2017-03-16 ENCOUNTER — Ambulatory Visit (HOSPITAL_BASED_OUTPATIENT_CLINIC_OR_DEPARTMENT_OTHER): Payer: Medicare Other

## 2017-03-16 VITALS — BP 147/61 | HR 72 | Temp 98.6°F

## 2017-03-16 DIAGNOSIS — M810 Age-related osteoporosis without current pathological fracture: Secondary | ICD-10-CM

## 2017-03-16 MED ORDER — DENOSUMAB 60 MG/ML ~~LOC~~ SOLN
60.0000 mg | Freq: Once | SUBCUTANEOUS | Status: AC
  Administered 2017-03-16: 60 mg via SUBCUTANEOUS
  Filled 2017-03-16: qty 1

## 2017-03-16 NOTE — Patient Instructions (Signed)

## 2017-05-13 ENCOUNTER — Encounter: Payer: Medicare Other | Admitting: Adult Health

## 2017-05-19 ENCOUNTER — Telehealth: Payer: Self-pay

## 2017-05-19 NOTE — Telephone Encounter (Signed)
Phone call made to patient to discuss SCP visit.  It was explained to pt briefly what visit would cover.  She stated that at  this time she was not having any problems and that it was a very busy time of year for her.  Patient stated that she preferred to cancel appt at this time and wait to see Dr. Jana Hakim at next visit in May 2019.  She said that she appreciated the call and the appt but that she wants to call us if she has any issues.   Will cancel appt.

## 2017-05-20 ENCOUNTER — Encounter: Payer: Medicare Other | Admitting: Adult Health

## 2017-08-07 IMAGING — MG 2D DIGITAL DIAGNOSTIC BILATERAL MAMMOGRAM WITH CAD AND ADJUNCT T
8 of 17 series · 8 of 40 positions shown · non-contrast
Comparison: Previous exam(s).

CLINICAL DATA: Status post MRI guided biopsy of left breast 12
o'clock mass, ultrasound-guided biopsy of right breast 11 o'clock
mass, and stereotactic guided biopsy of right breast upper outer
quadrant posterior focal asymmetry.

EXAM:
DIAGNOSTIC BILATERAL MAMMOGRAM POST MRI, ULTRASOUND AND STEREOTACTIC
BIOPSY

[R ML synth-2D]
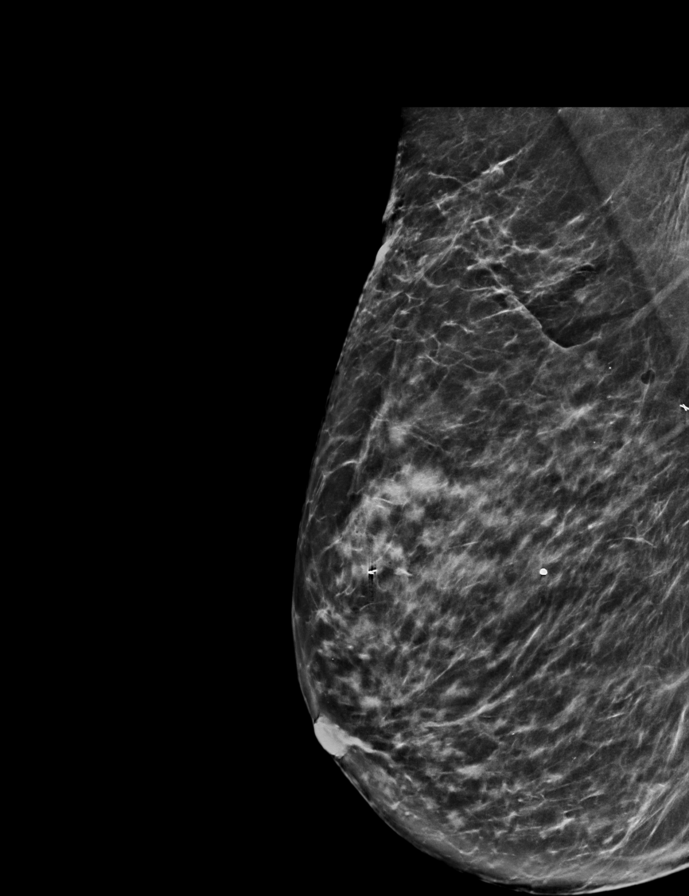

[R ML (1 of 2)]
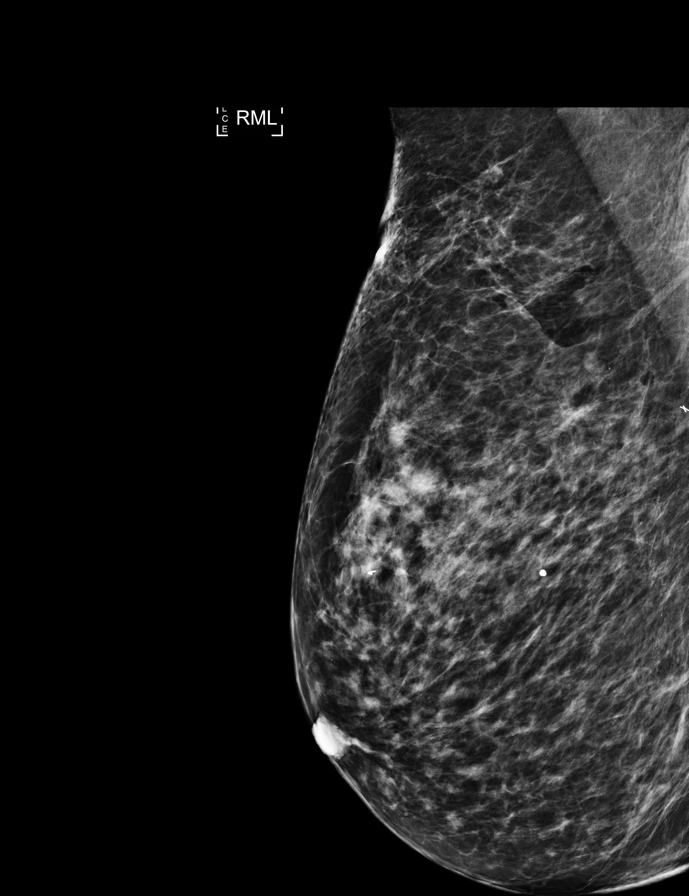

[R CC]
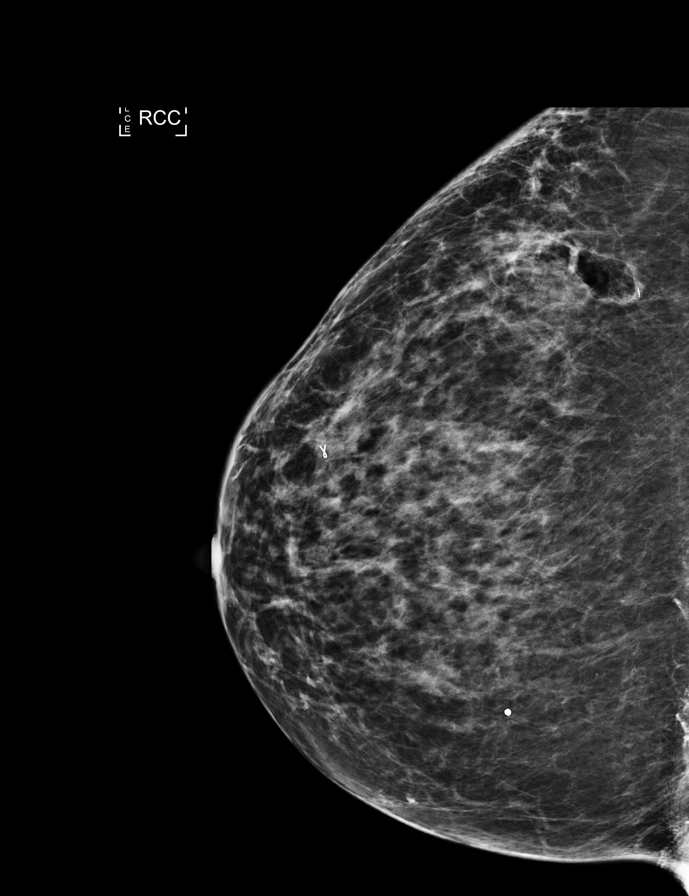

[L ML]
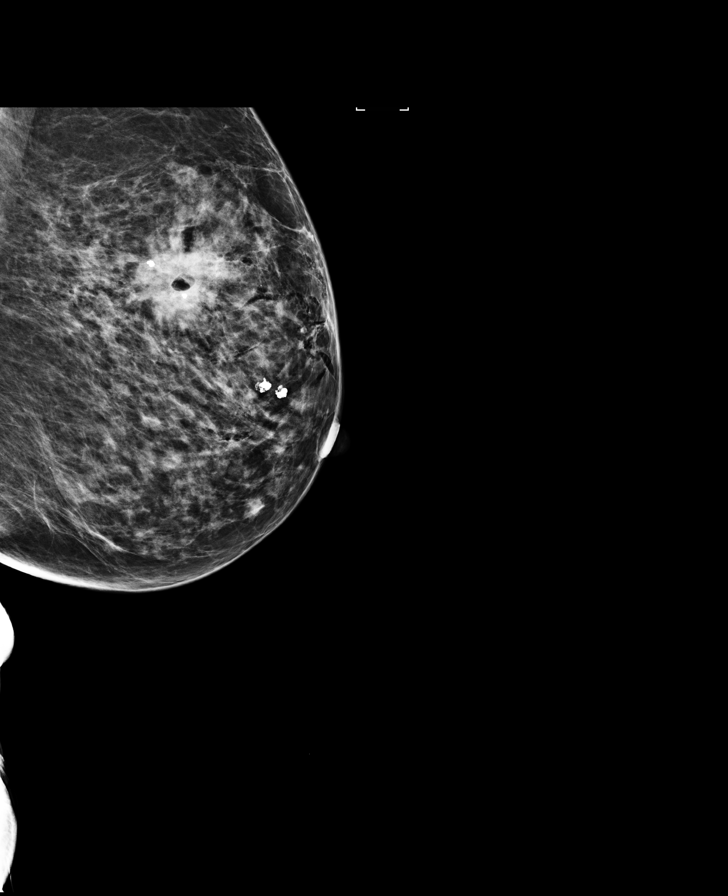

[L ML synth-2D]
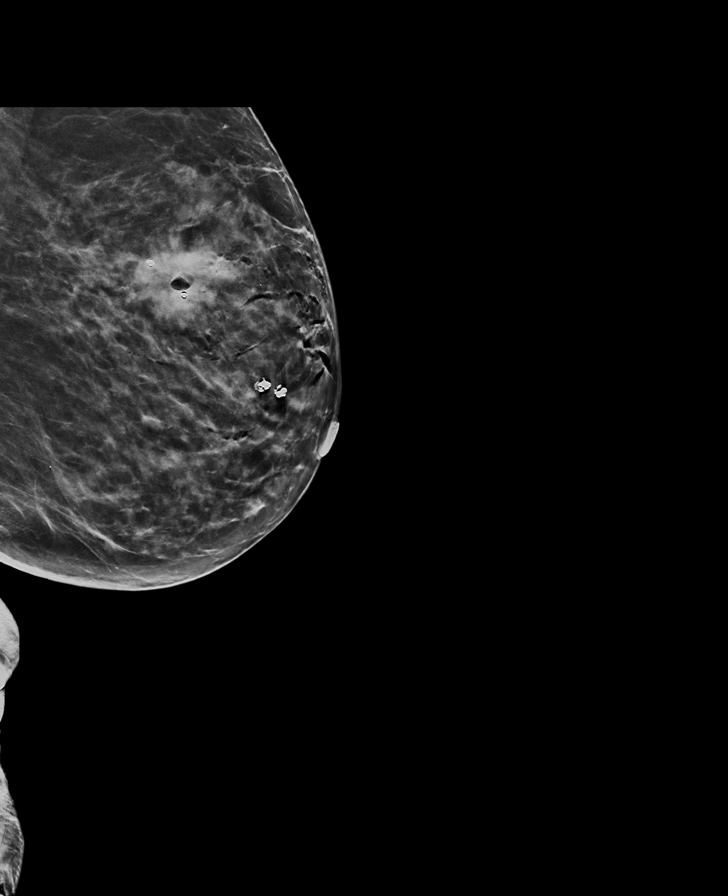

[R ML (2 of 2)]
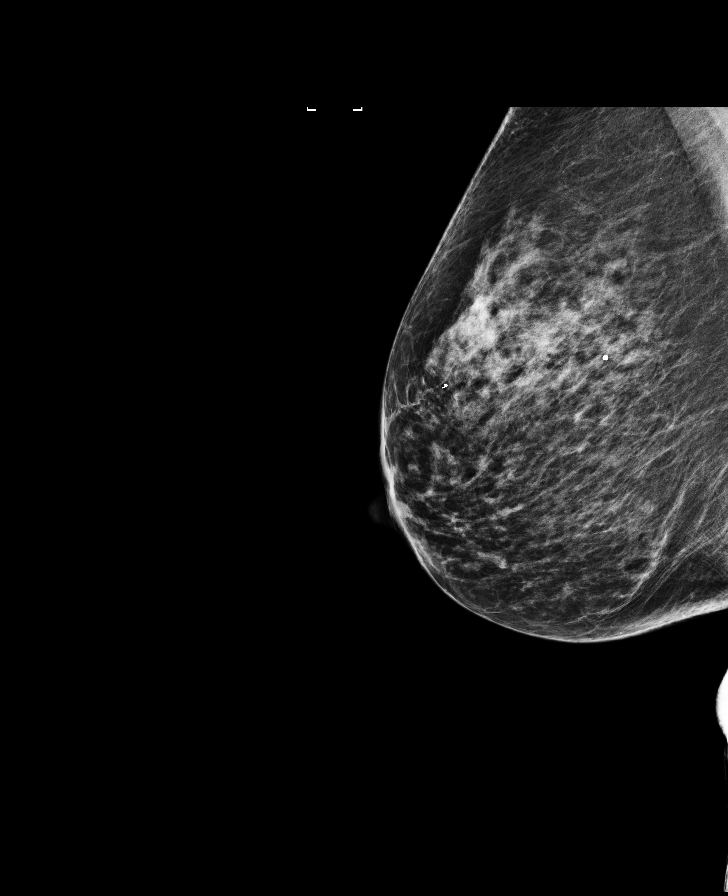

[R CC synth-2D]
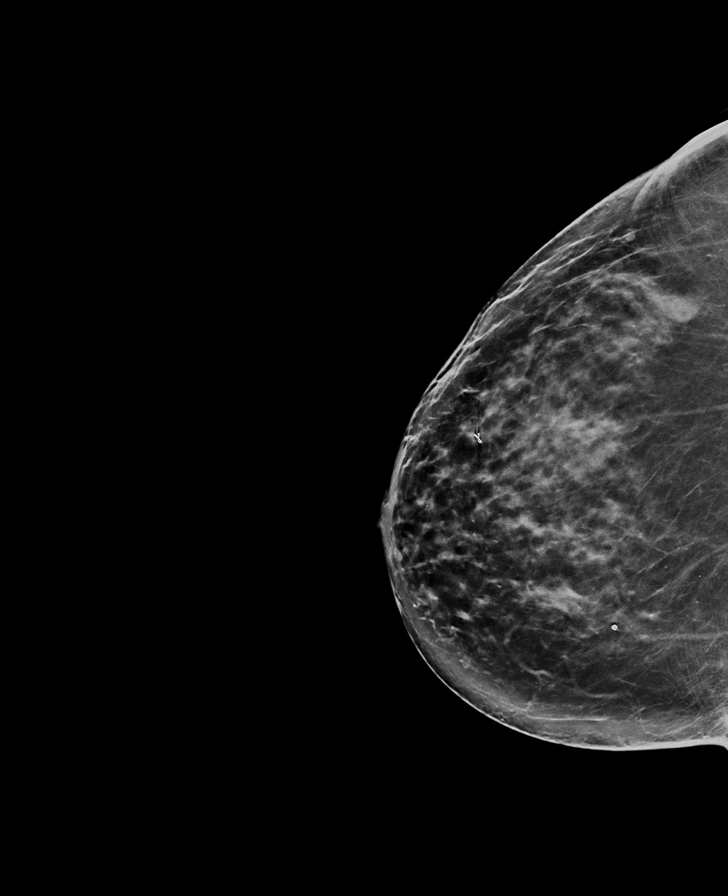

[L CC]
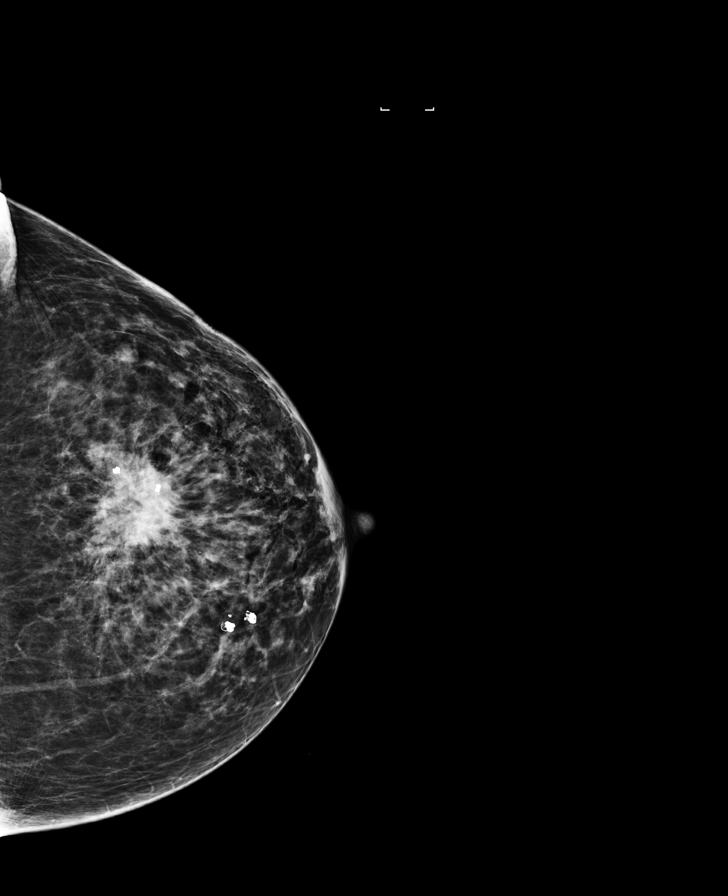

[8 of 40 positions shown; findings below may reference images not displayed]

FINDINGS: Mammographic images were obtained following MRI guided biopsy of
left breast 12 o'clock mass, ultrasound-guided biopsy of right
breast 11 o'clock anterior depth mass, and stereotactic core needle
biopsy of right breast upper outer quadrant posterior depth focal
asymmetry.

Two-view left breast mammography demonstrates presence of
dumbbell-shaped tissue marker within spiculated left breast 12
o'clock mass. Expected post biopsy changes are seen.

Two-view right breast mammography demonstrates presence of ribbon
shaped tissue marker within right breast 11 o'clock mass, anterior
depth. X shaped tissue marker is present within the right breast
upper outer quadrant, posterior depth. The X shaped marker, placed
after stereotactic core needle biopsy of the right breast upper
outer quadrant posterior focal asymmetry, is noted to be located 4
cm inferior to the biopsy site on the mediolateral oblique view, and
overlies the biopsy cavity on the craniocaudal view. Post biopsy
changes are noted.
IMPRESSION: Successful placement of dumbbell-shaped tissue marker within left
breast 12 o'clock mass, post MRI guided core needle biopsy.

Successful placement of coil shaped tissue marker within the right
breast 11 o'clock mass, post ultrasound-guided core needle biopsy.

Successful placement of X shaped tissue marker within the right
breast upper outer quadrant, posterior depth, focal asymmetry, post
stereotactic guided core needle biopsy. The X shaped tissue marker
is noted to be located 4 cm inferior to the biopsy site. The biopsy
site is marked on the postprocedural images, should this area need
to be excised.

Final Assessment: Post Procedure Mammograms for Marker Placement

## 2017-08-18 ENCOUNTER — Telehealth: Payer: Self-pay | Admitting: Oncology

## 2017-08-18 NOTE — Telephone Encounter (Signed)
Left message for patient regarding upcoming December appointments.  °

## 2017-09-07 ENCOUNTER — Ambulatory Visit (HOSPITAL_BASED_OUTPATIENT_CLINIC_OR_DEPARTMENT_OTHER): Payer: Medicare Other

## 2017-09-07 ENCOUNTER — Other Ambulatory Visit (HOSPITAL_BASED_OUTPATIENT_CLINIC_OR_DEPARTMENT_OTHER): Payer: Medicare Other

## 2017-09-07 ENCOUNTER — Other Ambulatory Visit: Payer: Self-pay

## 2017-09-07 VITALS — BP 145/60 | HR 70 | Temp 98.3°F | Resp 18

## 2017-09-07 DIAGNOSIS — M810 Age-related osteoporosis without current pathological fracture: Secondary | ICD-10-CM

## 2017-09-07 DIAGNOSIS — C50412 Malignant neoplasm of upper-outer quadrant of left female breast: Secondary | ICD-10-CM

## 2017-09-07 DIAGNOSIS — Z170 Estrogen receptor positive status [ER+]: Principal | ICD-10-CM

## 2017-09-07 LAB — COMPREHENSIVE METABOLIC PANEL
ALT: 13 U/L (ref 0–55)
ANION GAP: 8 meq/L (ref 3–11)
AST: 18 U/L (ref 5–34)
Albumin: 3.8 g/dL (ref 3.5–5.0)
Alkaline Phosphatase: 54 U/L (ref 40–150)
BUN: 14.4 mg/dL (ref 7.0–26.0)
CALCIUM: 9.2 mg/dL (ref 8.4–10.4)
CO2: 26 meq/L (ref 22–29)
CREATININE: 0.7 mg/dL (ref 0.6–1.1)
Chloride: 105 mEq/L (ref 98–109)
EGFR: >60 mL/min/{1.73_m2} (ref >60)
Glucose: 100 mg/dl (ref 70–140)
Potassium: 4.3 mEq/L (ref 3.5–5.1)
Sodium: 139 mEq/L (ref 136–145)
TOTAL PROTEIN: 7.2 g/dL (ref 6.4–8.3)
Total Bilirubin: 0.52 mg/dL (ref 0.20–1.20)

## 2017-09-07 LAB — CBC WITH DIFFERENTIAL/PLATELET
BASO%: 0.5 % (ref 0.0–2.0)
BASOS ABS: 0 10*3/uL (ref 0.0–0.1)
EOS ABS: 0.2 10*3/uL (ref 0.0–0.5)
EOS%: 3.9 % (ref 0.0–7.0)
HEMATOCRIT: 41.3 % (ref 34.8–46.6)
HEMOGLOBIN: 13.4 g/dL (ref 11.6–15.9)
LYMPH%: 30.8 % (ref 14.0–49.7)
MCH: 32.1 pg (ref 25.1–34.0)
MCHC: 32.4 g/dL (ref 31.5–36.0)
MCV: 98.8 fL (ref 79.5–101.0)
MONO#: 0.6 10*3/uL (ref 0.1–0.9)
MONO%: 14.4 % — AB (ref 0.0–14.0)
NEUT#: 2 10*3/uL (ref 1.5–6.5)
NEUT%: 50.4 % (ref 38.4–76.8)
PLATELETS: 194 10*3/uL (ref 145–400)
RBC: 4.18 10*6/uL (ref 3.70–5.45)
RDW: 13.7 % (ref 11.2–14.5)
WBC: 3.9 10*3/uL (ref 3.9–10.3)
lymph#: 1.2 10*3/uL (ref 0.9–3.3)

## 2017-09-07 MED ORDER — DENOSUMAB 60 MG/ML ~~LOC~~ SOLN
60.0000 mg | Freq: Once | SUBCUTANEOUS | Status: AC
  Administered 2017-09-07: 60 mg via SUBCUTANEOUS

## 2017-09-07 MED ORDER — ANASTROZOLE 1 MG PO TABS: 1.0000 mg | ORAL_TABLET | Freq: Every day | ORAL | 4 refills | Status: DC

## 2017-09-07 NOTE — Patient Instructions (Signed)

## 2017-10-20 ENCOUNTER — Other Ambulatory Visit: Payer: Self-pay | Admitting: *Deleted

## 2017-10-20 DIAGNOSIS — C50412 Malignant neoplasm of upper-outer quadrant of left female breast: Secondary | ICD-10-CM

## 2017-10-20 DIAGNOSIS — Z170 Estrogen receptor positive status [ER+]: Principal | ICD-10-CM

## 2017-11-16 ENCOUNTER — Other Ambulatory Visit: Payer: Self-pay | Admitting: Oncology

## 2017-11-22 ENCOUNTER — Ambulatory Visit
Admission: RE | Admit: 2017-11-22 | Discharge: 2017-11-22 | Disposition: A | Discharge status: Home / Self Care | Payer: Medicare Other | Source: Ambulatory Visit | Attending: Oncology | Admitting: Oncology

## 2017-11-22 DIAGNOSIS — C50412 Malignant neoplasm of upper-outer quadrant of left female breast: Secondary | ICD-10-CM

## 2017-11-22 DIAGNOSIS — Z170 Estrogen receptor positive status [ER+]: Principal | ICD-10-CM

## 2018-02-03 ENCOUNTER — Inpatient Hospital Stay: Payer: Medicare Other | Attending: Oncology | Admitting: Oncology

## 2018-02-03 ENCOUNTER — Inpatient Hospital Stay: Payer: Medicare Other

## 2018-02-03 VITALS — BP 152/66 | HR 68 | Temp 98.4°F | Resp 18 | Ht 63.0 in | Wt 144.7 lb

## 2018-02-03 DIAGNOSIS — Z85038 Personal history of other malignant neoplasm of large intestine: Secondary | ICD-10-CM | POA: Diagnosis not present

## 2018-02-03 DIAGNOSIS — Z170 Estrogen receptor positive status [ER+]: Principal | ICD-10-CM

## 2018-02-03 DIAGNOSIS — Z800 Family history of malignant neoplasm of digestive organs: Secondary | ICD-10-CM | POA: Insufficient documentation

## 2018-02-03 DIAGNOSIS — Z79811 Long term (current) use of aromatase inhibitors: Secondary | ICD-10-CM | POA: Diagnosis not present

## 2018-02-03 DIAGNOSIS — Z90722 Acquired absence of ovaries, bilateral: Secondary | ICD-10-CM | POA: Diagnosis not present

## 2018-02-03 DIAGNOSIS — Z90710 Acquired absence of both cervix and uterus: Secondary | ICD-10-CM | POA: Insufficient documentation

## 2018-02-03 DIAGNOSIS — C50412 Malignant neoplasm of upper-outer quadrant of left female breast: Secondary | ICD-10-CM

## 2018-02-03 DIAGNOSIS — Z86010 Personal history of colonic polyps: Secondary | ICD-10-CM | POA: Insufficient documentation

## 2018-02-03 DIAGNOSIS — Z923 Personal history of irradiation: Secondary | ICD-10-CM | POA: Diagnosis not present

## 2018-02-03 DIAGNOSIS — Z79899 Other long term (current) drug therapy: Secondary | ICD-10-CM | POA: Insufficient documentation

## 2018-02-03 LAB — CBC WITH DIFFERENTIAL/PLATELET
BASOS PCT: 1 %
Basophils Absolute: 0 10*3/uL (ref 0.0–0.1)
Eosinophils Absolute: 0.1 10*3/uL (ref 0.0–0.5)
Eosinophils Relative: 4 %
HEMATOCRIT: 40 % (ref 34.8–46.6)
HEMOGLOBIN: 13.3 g/dL (ref 11.6–15.9)
LYMPHS ABS: 1.1 10*3/uL (ref 0.9–3.3)
Lymphocytes Relative: 31 %
MCH: 31.8 pg (ref 25.1–34.0)
MCHC: 33.1 g/dL (ref 31.5–36.0)
MCV: 96 fL (ref 79.5–101.0)
MONOS PCT: 11 %
Monocytes Absolute: 0.4 10*3/uL (ref 0.1–0.9)
NEUTROS ABS: 1.9 10*3/uL (ref 1.5–6.5)
NEUTROS PCT: 53 %
Platelets: 186 10*3/uL (ref 145–400)
RBC: 4.17 MIL/uL (ref 3.70–5.45)
RDW: 14.1 % (ref 11.2–14.5)
WBC: 3.6 10*3/uL — AB (ref 3.9–10.3)

## 2018-02-03 LAB — COMPREHENSIVE METABOLIC PANEL
ALT: 16 U/L (ref 0–55)
AST: 19 U/L (ref 5–34)
Albumin: 3.9 g/dL (ref 3.5–5.0)
Alkaline Phosphatase: 44 U/L (ref 40–150)
Anion gap: 5 (ref 3–11)
BUN: 13 mg/dL (ref 7–26)
CHLORIDE: 106 mmol/L (ref 98–109)
CO2: 29 mmol/L (ref 22–29)
Calcium: 9.7 mg/dL (ref 8.4–10.4)
Creatinine, Ser: 0.74 mg/dL (ref 0.60–1.10)
GFR calc non Af Amer: >60 mL/min (ref >60)
Glucose, Bld: 92 mg/dL (ref 70–140)
POTASSIUM: 4.1 mmol/L (ref 3.5–5.1)
SODIUM: 140 mmol/L (ref 136–145)
Total Bilirubin: 0.5 mg/dL (ref 0.2–1.2)
Total Protein: 6.9 g/dL (ref 6.4–8.3)

## 2018-02-03 MED ORDER — ANASTROZOLE 1 MG PO TABS: 1.0000 mg | ORAL_TABLET | Freq: Every day | ORAL | 4 refills | Status: DC

## 2018-02-03 NOTE — Progress Notes (Signed)
[place]  [phone] [phone]     ID: LAUNA GOEDKEN DOB: 09-Nov-1930  MR#: [id number]  LFY#:[id number]  Patient Care Team: [name], MD as PCP - General (Family Medicine) [name], MD as Consulting Physician (General Surgery) [name], MD as Consulting Physician (Gastroenterology) Cadence Minton, [name], MD as Consulting Physician (Oncology) [name], MD as Attending Physician (Radiation Oncology) [name], MD as Consulting Physician (Family Medicine) [name], MD as Consulting Physician (Obstetrics and Gynecology) [name], MD as Consulting Physician (Orthopedic Surgery) Delice Bison, Charlestine Massed, NP as Nurse Practitioner (Hematology and Oncology) OTHER MD:  CHIEF COMPLAINT: Estrogen receptor positive invasive breast cancer  CURRENT TREATMENT: Anastrozole, denosumab/Prolia   BREAST CANCER HISTORY: From the original intake note:  Ralyn herself palpated a mass in her left breast mid-June 2017. She brought it to the attention of Dr. Coletta Memos who obtained screening mammography showing possible abnormalities in both breasts. Elianah was then referred to the [facility] where mammography and ultrasonography 03/18/2015 showed in the right breast an 8 mm spiculated mass at the 11:00 position 5 cm from the nipple and a second area of altered architecture also in the upper outer quadrant. In the left breast there was a 2.5 cm is spiculated mass in the upper outer quadrant 4 cm from the nipple. Ultrasound of both axillae were negative.  On 03/17/2016 the patient underwent biopsy of all 3 suspicious areas. In the right breast, both areas showed only fibrocystic change, with no evidence of malignancy ([id number]). This was read as concordant by radiology.  In the left breast however the final pathology (SAA [id number]) showed an invasive ductal carcinoma, grade 2 or 3, estrogen receptor 5% positive, with moderate  staining intensity, progesterone receptor negative, with an MIB-1 of 5%, and no HER-2 amplification, the signals ratio being 1.53 and the number per cell 3.45.  Noell also has a remote history of colon cancer, in remission for more than 10 years with the most recent colonoscopy 2012 negative  INTERVAL HISTORY: Seira returns today for follow-up of her estrogen receptor positive breast cancer.   She continues on anastrozole. She tolerates this well, except she has noticed her hair has started to thin out.  She also receives denosumab/Prolia.  Sometimes she has leg cramps and she thinks the Prolia may be responsible.  Since her last visit here she underwent a diagnostic bilateral breast mammogram with CAD and TOMO, breast density category C, showing no evidence of malignancy.   REVIEW OF SYSTEMS: Jazyah is doing well, however, she reports her brother passed away a couple of weeks ago. Her and her sisters have been visiting their other brother. She continues to stay busy and. She continues to garden and for exercise she touches her toes about 30 times a day. She denies unusual headaches, visual changes, nausea, vomiting, or dizziness. There has been no unusual cough, phlegm production, or pleurisy. This been no change in bowel or bladder habits. She denies unexplained fatigue or unexplained weight loss, bleeding, rash, or fever. A detailed review of systems was otherwise noncontributory.   PAST MEDICAL HISTORY: Past Medical History:  Diagnosis Date  . Breast cancer ([place]) 2017   left breast  . Colon cancer ([place]) 1994  . History of radiation therapy 06/16/16- 07/13/16   Left Breast  . Hx of adenomatous colonic polyps   . Varicose vein     PAST SURGICAL HISTORY: Past Surgical History:  Procedure Laterality Date  . ABDOMINAL HYSTERECTOMY    .  BREAST BIOPSY Right   . BREAST BIOPSY Right   . BREAST LUMPECTOMY Left 2017  . COLON RESECTION     sigmoid  . COLON SURGERY  1994   colon cancer    . COLONOSCOPY W/ POLYPECTOMY  last 02/18/11   multiple, prior colon cancer and polyps, 7 mm adenoma 2012  . OVARIAN CYST SURGERY    . PARTIAL MASTECTOMY WITH NEEDLE LOCALIZATION Left 04/22/2016   Procedure: LEFT PARTIAL MASTECTOMY WITH DOUBLE NEEDLE LOCALIZATION REEXCISION INFERIOR AND LATERAL MARGIN ADJACENT TISSUE TRANSFER;  Surgeon: [name], MD;  Location: [facility];  Service: General;  Laterality: Left;  . TONSILLECTOMY      FAMILY HISTORY Family History  Problem Relation Age of Onset  . Colon cancer Mother 68  . Colon polyps Father 24  . Pancreatic cancer Neg Hx   . Rectal cancer Neg Hx   . Stomach cancer Neg Hx   The patient's father died at age 45 from postoperative complications in the setting of peptic ulcer disease. The patient's mother died at age 53 with colon cancer. The patient had 5 brothers, 2 sisters. One brother died in an automobile accident. One brother has colitis. A maternal aunt developed cancer in her "female organs" in her 1s. The patient has no further information regarding this. She is not aware of any other cancers in the family aside of course from her own remote colon cancer.  GYNECOLOGIC HISTORY:  No LMP recorded. Patient has had a hysterectomy. Menarche age 84, first live birth age 33. The patient is [place] P1 area in 1955 she underwent total abdominal hysterectomy with bilateral salpingo-oophorectomy. She tried hormone replacement briefly (less than a year). She never took oral contraceptives.  SOCIAL HISTORY:  Xela still actively manages rental properties. She is widowed and lives by herself with no pets. Her son TYNESIA HARRAL is a pulmonologist in [place], [place]. The patient has 4 grandchildren, and no great grandchildren. She attends [employer] locally. 3 of her siblings live in town including of course her sister Joycelyn Schmid, who is a retired [employer] consultant    ADVANCED DIRECTIVES:At the 03/27/2016 visit the patient was  given the appropriate forms to complete and notarize at her discretion. She intends to name her son Antony Haste as healthcare power of attorney. He can be reached at [phone]  HEALTH MAINTENANCE: Social History   Tobacco Use  . Smoking status: Never Smoker  . Smokeless tobacco: Never Used  Substance Use Topics  . Alcohol use: No  . Drug use: No     Colonoscopy:02/18/2011, [facility]; tubular adenoma with no high-grade dysplasia  Bone density: [facility], 01/08/2010, T score -2.2, decreased from prior   Allergies  Allergen Reactions  . Sotradecol [Sodium Tetradecyl Sulfate] Anaphylaxis  . Celebrex [Celecoxib]   . Clindamycin   . Conjugated Estrogens   . Estradiol Benzoate Other (See Comments)    Caused fast heart rate  . Estrogens Conjugated   . Hypaque-M [Diatrizone Sodium-Diatrizone Meglumine] Hives    Ct scan IVP dye  . Penicillins Hives  . Tramadol Nausea Only    Current Outpatient Medications  Medication Sig Dispense Refill  . anastrozole (ARIMIDEX) 1 MG tablet Take 1 tablet (1 mg total) by mouth daily. 90 tablet 4  . calcitonin, salmon, (MIACALCIN) 200 UNIT/ACT nasal spray 1 spray by Nasal route daily.      . Calcium Carbonate (CALCIUM-CARB 600 PO) Take by mouth.    . cetirizine (ZYRTEC) 10 MG tablet Take 10  mg by mouth daily.    . Cholecalciferol (VITAMIN D3) 1000 units CAPS Take by mouth.    . Grape Seed Extract 100 MG CAPS Take 400 each by mouth.    . Multiple Vitamin (MULTIVITAMIN) tablet Take 1 tablet by mouth daily.      . Omega-3 Fatty Acids (FISH OIL) 1000 MG CPDR Take by mouth.     Current Facility-Administered Medications  Medication Dose Route Frequency Provider Last Rate Last Dose  . 0.9 %  sodium chloride infusion  500 mL Intravenous Continuous [name], MD        OBJECTIVE: Older white woman who appears well  Vitals:   02/03/18 1524  BP: (!) 152/66  Pulse: 68  Resp: 18  Temp: 98.4 F (36.9 C)  SpO2: 97%     Body mass index is 25.63 kg/m.     ECOG FS:0 - Asymptomatic  Sclerae unicteric, pupils round and equal No cervical or supraclavicular adenopathy Lungs no rales or rhonchi Heart regular rate and rhythm Abd soft, nontender, positive bowel sounds MSK no focal spinal tenderness, no upper extremity lymphedema Neuro: nonfocal, well oriented, appropriate affect Breasts: The right breast is unremarkable.  The left breast has undergone lumpectomy followed by radiation.  There is chronic nipple retraction, which is mild.  There is no evidence of recurrence.  Both axillae are benign.  LAB RESULTS:  CMP     Component Value Date/Time   NA 140 02/03/2018 1503   NA 139 09/07/2017 1432   K 4.1 02/03/2018 1503   K 4.3 09/07/2017 1432   CL 106 02/03/2018 1503   CO2 29 02/03/2018 1503   CO2 26 09/07/2017 1432   GLUCOSE 92 02/03/2018 1503   GLUCOSE 100 09/07/2017 1432   BUN 13 02/03/2018 1503   BUN 14.4 09/07/2017 1432   CREATININE 0.74 02/03/2018 1503   CREATININE 0.7 09/07/2017 1432   CALCIUM 9.7 02/03/2018 1503   CALCIUM 9.2 09/07/2017 1432   PROT 6.9 02/03/2018 1503   PROT 7.2 09/07/2017 1432   ALBUMIN 3.9 02/03/2018 1503   ALBUMIN 3.8 09/07/2017 1432   AST 19 02/03/2018 1503   AST 18 09/07/2017 1432   ALT 16 02/03/2018 1503   ALT 13 09/07/2017 1432   ALKPHOS 44 02/03/2018 1503   ALKPHOS 54 09/07/2017 1432   BILITOT 0.5 02/03/2018 1503   BILITOT 0.52 09/07/2017 1432   GFRNONAA >60 02/03/2018 1503   GFRAA >60 02/03/2018 1503    INo results found for: SPEP, UPEP  Lab Results  Component Value Date   WBC 3.6 (L) 02/03/2018   NEUTROABS 1.9 02/03/2018   HGB 13.3 02/03/2018   HCT 40.0 02/03/2018   MCV 96.0 02/03/2018   PLT 186 02/03/2018      Chemistry      Component Value Date/Time   NA 140 02/03/2018 1503   NA 139 09/07/2017 1432   K 4.1 02/03/2018 1503   K 4.3 09/07/2017 1432   CL 106 02/03/2018 1503   CO2 29 02/03/2018 1503   CO2 26 09/07/2017 1432   BUN 13 02/03/2018 1503   BUN 14.4 09/07/2017 1432     CREATININE 0.74 02/03/2018 1503   CREATININE 0.7 09/07/2017 1432      Component Value Date/Time   CALCIUM 9.7 02/03/2018 1503   CALCIUM 9.2 09/07/2017 1432   ALKPHOS 44 02/03/2018 1503   ALKPHOS 54 09/07/2017 1432   AST 19 02/03/2018 1503   AST 18 09/07/2017 1432   ALT 16 02/03/2018 1503  ALT 13 09/07/2017 1432   BILITOT 0.5 02/03/2018 1503   BILITOT 0.52 09/07/2017 1432       No results found for: LABCA2  No components found for: LABCA125  No results for input(s): INR in the last 168 hours.  Urinalysis No results found for: COLORURINE, APPEARANCEUR, LABSPEC, PHURINE, GLUCOSEU, HGBUR, BILIRUBINUR, KETONESUR, PROTEINUR, UROBILINOGEN, NITRITE, LEUKOCYTESUR   STUDIES: She underwent a diagnostic bilateral breast mammogram with CAD and TOMO, breast density category C, showing no evidence of malignancy.  ELIGIBLE FOR AVAILABLE RESEARCH PROTOCOL: PALLAS  ASSESSMENT: 84 y.o. [place] woman status post left breast upper outer quadrant biopsy 03/17/2016 for a clinical T2 N0, stage IIa invasive ductal carcinoma, grade 2 or 3, minimally estrogen receptor positive at 5% with moderate staining intensity, progesterone receptor and HER-2 negative, with an MIB-1 of 5%  (1) Status post left Lumpectomy without sentinel lymph node sampling 04/22/2016 for apT2 pNX, stage II   invasive ductal carcinoma, grade 3, with negative margins  (a) repeat prognostic panel pending  (2) Mammaprint results "high risk" suggests a risk of recurrence with no systemic treatment of 29%   (a) patient offered adjuvant CMF chemotherapy but opted against it   (3) adjuvant radiation 06/16/16-07/13/16 Site/dose:   1) Left breast / 40.05 Gy in 15 fx                         2) Boost / 10 Gy in 10 Fx  (4) started anastrozole 07/22/2016  (a) DEXA scan 06/02/2016 shows osteoporosis, with a T score of -2.7  (b) denosumab Prolia started 10/20/2016  PLAN: Emmersen is now almost 2 years out from definitive surgery  for her breast cancer with no evidence of disease recurrence.  This is favorable.  She is tolerating anastrozole well and the plan will be to continue that a total of 5 years.  She also does well with the denosumab/Prolia.  She will have a dose in June and then again in December.  In March 2019 when she has her next mammography she will also have a DEXA scan when she sees me again a year from now we will decide whether she needs Prolia further or whether we can stop that medication.  She has taken herself off the calcitonin.  She tells me that she read somewhere that it causes cancer of the nasopharynx.  Since she is on Prolia at this point probably that is fine  She knows to call for any other issues that may develop before the next visit.  Darryl Blumenstein, [name], MD  02/03/18 3:40 PM Medical Oncology and Hematology [facility] [address] [place], [place] [address] Tel. [phone]    Fax. [phone]   This document serves as a record of services personally performed by Chauncey Cruel, MD. It was created on his behalf by Margit Banda, a trained medical scribe. The creation of this record is based on the scribe's personal observations and the provider's statements to them.   I have reviewed the above documentation for accuracy and completeness, and I agree with the above.

## 2018-02-04 ENCOUNTER — Telehealth: Payer: Self-pay | Admitting: Oncology

## 2018-02-04 NOTE — Telephone Encounter (Signed)
Spoke with patient re June/December injection appointments. [facility] will call re  dexa scan.

## 2018-02-24 ENCOUNTER — Inpatient Hospital Stay: Payer: Medicare Other | Attending: Oncology

## 2018-02-24 VITALS — BP 139/82 | HR 84 | Temp 98.5°F | Resp 16

## 2018-02-24 DIAGNOSIS — M810 Age-related osteoporosis without current pathological fracture: Secondary | ICD-10-CM | POA: Insufficient documentation

## 2018-02-24 DIAGNOSIS — Z79899 Other long term (current) drug therapy: Secondary | ICD-10-CM | POA: Insufficient documentation

## 2018-02-24 MED ORDER — DENOSUMAB 60 MG/ML ~~LOC~~ SOSY
60.0000 mg | PREFILLED_SYRINGE | Freq: Once | SUBCUTANEOUS | Status: AC
  Administered 2018-02-24: 60 mg via SUBCUTANEOUS

## 2018-02-24 NOTE — Patient Instructions (Signed)

## 2018-06-08 ENCOUNTER — Ambulatory Visit
Admission: RE | Admit: 2018-06-08 | Discharge: 2018-06-08 | Disposition: A | Discharge status: Home / Self Care | Payer: Medicare Other | Source: Ambulatory Visit | Attending: Oncology | Admitting: Oncology

## 2018-06-08 DIAGNOSIS — C50412 Malignant neoplasm of upper-outer quadrant of left female breast: Secondary | ICD-10-CM

## 2018-06-08 DIAGNOSIS — Z170 Estrogen receptor positive status [ER+]: Principal | ICD-10-CM

## 2018-08-25 ENCOUNTER — Inpatient Hospital Stay: Payer: Medicare Other | Attending: Adult Health

## 2018-08-25 ENCOUNTER — Inpatient Hospital Stay: Payer: Medicare Other

## 2018-08-25 ENCOUNTER — Telehealth: Payer: Self-pay

## 2018-08-25 DIAGNOSIS — Z170 Estrogen receptor positive status [ER+]: Principal | ICD-10-CM

## 2018-08-25 DIAGNOSIS — C50919 Malignant neoplasm of unspecified site of unspecified female breast: Secondary | ICD-10-CM | POA: Diagnosis not present

## 2018-08-25 DIAGNOSIS — M8580 Other specified disorders of bone density and structure, unspecified site: Secondary | ICD-10-CM | POA: Diagnosis not present

## 2018-08-25 DIAGNOSIS — C50412 Malignant neoplasm of upper-outer quadrant of left female breast: Secondary | ICD-10-CM

## 2018-08-25 DIAGNOSIS — M810 Age-related osteoporosis without current pathological fracture: Secondary | ICD-10-CM

## 2018-08-25 LAB — CBC WITH DIFFERENTIAL/PLATELET
ABS IMMATURE GRANULOCYTES: 0.03 10*3/uL (ref 0.00–0.07)
BASOS ABS: 0 10*3/uL (ref 0.0–0.1)
Basophils Relative: 0 %
Eosinophils Absolute: 0 10*3/uL (ref 0.0–0.5)
Eosinophils Relative: 0 %
HCT: 41.9 % (ref 36.0–46.0)
Hemoglobin: 13.4 g/dL (ref 12.0–15.0)
IMMATURE GRANULOCYTES: 0 %
Lymphocytes Relative: 15 %
Lymphs Abs: 1.3 10*3/uL (ref 0.7–4.0)
MCH: 31.6 pg (ref 26.0–34.0)
MCHC: 32 g/dL (ref 30.0–36.0)
MCV: 98.8 fL (ref 80.0–100.0)
MONOS PCT: 8 %
Monocytes Absolute: 0.7 10*3/uL (ref 0.1–1.0)
NEUTROS ABS: 6.6 10*3/uL (ref 1.7–7.7)
NEUTROS PCT: 77 %
NRBC: 0 % (ref 0.0–0.2)
Platelets: 241 10*3/uL (ref 150–400)
RBC: 4.24 MIL/uL (ref 3.87–5.11)
RDW: 13.9 % (ref 11.5–15.5)
WBC: 8.7 10*3/uL (ref 4.0–10.5)

## 2018-08-25 LAB — COMPREHENSIVE METABOLIC PANEL
ALBUMIN: 4.1 g/dL (ref 3.5–5.0)
ALK PHOS: 52 U/L (ref 38–126)
ALT: 18 U/L (ref 0–44)
ANION GAP: 10 (ref 5–15)
AST: 23 U/L (ref 15–41)
BILIRUBIN TOTAL: 0.6 mg/dL (ref 0.3–1.2)
BUN: 20 mg/dL (ref 8–23)
CALCIUM: 9.7 mg/dL (ref 8.9–10.3)
CO2: 26 mmol/L (ref 22–32)
Chloride: 105 mmol/L (ref 98–111)
Creatinine, Ser: 0.96 mg/dL (ref 0.44–1.00)
GFR calc non Af Amer: 54 mL/min — ABNORMAL LOW (ref >60)
GLUCOSE: 148 mg/dL — AB (ref 70–99)
POTASSIUM: 3.8 mmol/L (ref 3.5–5.1)
SODIUM: 141 mmol/L (ref 135–145)
TOTAL PROTEIN: 7.5 g/dL (ref 6.5–8.1)

## 2018-08-25 MED ORDER — DENOSUMAB 60 MG/ML ~~LOC~~ SOSY
60.0000 mg | PREFILLED_SYRINGE | Freq: Once | SUBCUTANEOUS | Status: AC
  Administered 2018-08-25: 60 mg via SUBCUTANEOUS

## 2018-08-25 NOTE — Telephone Encounter (Signed)
Per 12/5 patient walk in. Patient requested a follow up appointment wit Magrinate. I Levada Dy) Called RN Val to verify appointment to be scheduled. Printed a calender

## 2018-08-25 NOTE — Patient Instructions (Signed)

## 2018-10-14 ENCOUNTER — Other Ambulatory Visit: Payer: Self-pay | Admitting: Oncology

## 2018-10-14 DIAGNOSIS — Z9889 Other specified postprocedural states: Secondary | ICD-10-CM

## 2018-10-27 ENCOUNTER — Telehealth: Payer: Self-pay | Admitting: *Deleted

## 2018-10-27 NOTE — Telephone Encounter (Signed)
Current [facility] treatment Anastrozole, Denosumab/Prolia injections.  No antibiotic interference or interaction with treatment regimen.  Gracie provided with above information.  [facility] recommends Dr. Burt Ek prescribe antibiotic suited for tooth infection not contraindicated with patient allergies.

## 2018-10-27 NOTE — Telephone Encounter (Signed)
"  Shirlee Limerick with Dr. Burt Ek DDS [phone]) calling about mutual patient.  Teresa Boyd seen today for tooth infection.  She is allergic to clindamycin and penicillin.  What has provider there ordered for antibiotics?  Could you ask what he recommends that will not interfere with her treatment.  Please return call.  We'd like to cal something in today."    This nurse did not note antibiotic orders in [facility] EMR from our provider to supply at this time.

## 2018-11-30 ENCOUNTER — Other Ambulatory Visit: Payer: Self-pay

## 2018-11-30 ENCOUNTER — Ambulatory Visit
Admission: RE | Admit: 2018-11-30 | Discharge: 2018-11-30 | Disposition: A | Discharge status: Home / Self Care | Payer: Medicare Other | Source: Ambulatory Visit | Attending: Oncology | Admitting: Oncology

## 2018-11-30 DIAGNOSIS — Z9889 Other specified postprocedural states: Secondary | ICD-10-CM

## 2019-01-30 ENCOUNTER — Telehealth: Payer: Self-pay | Admitting: Oncology

## 2019-01-30 NOTE — Telephone Encounter (Signed)
Called regarding upcoming [facility] appointment, patient would prefer this to be a telephone visit.  °

## 2019-01-30 NOTE — Progress Notes (Signed)
Teresa Boyd  Telephone:(336) [phone] Fax:(336) [phone]     ID: [name] DOB: [birth year]  MR#: [id number]  ZCH#:[id number]  Patient Care Team: Bartholome Bill, MD as PCP - General (Family Medicine) [name], MD as Consulting Physician (General Surgery) [name], MD as Consulting Physician (Gastroenterology) [name], [name], MD as Consulting Physician (Oncology) [name], MD as Attending Physician (Radiation Oncology) [name], MD as Consulting Physician (Family Medicine) [name], MD as Consulting Physician (Obstetrics and Gynecology) [name], MD as Consulting Physician (Orthopedic Surgery) [name], NP as Nurse Practitioner (Hematology and Oncology) OTHER MD:  I connected with [name] on 01/31/19 at 11:00 AM EDT by telephone visit and verified that I am speaking with the correct person using two identifiers.   I discussed the limitations, risks, security and privacy concerns of performing an evaluation and management service by telemedicine and the availability of in-person appointments. I also discussed with the patient that there may be a patient responsible charge related to this service. The patient expressed understanding and agreed to proceed.   Other persons participating in the visit and their role in the encounter: Wilburn Mylar, scribe   Patients location: home  Providers location: [place]: Estrogen receptor positive invasive breast cancer  CURRENT TREATMENT: Anastrozole, denosumab/Prolia   INTERVAL HISTORY: Chantilly was contacted today for follow-up of her estrogen receptor positive breast cancer.   She continues on anastrozole, which she tolerates well. She denies hot flashes.  She also receives denosumab/Prolia.  Her most recent dose was 08/25/2018.  Since her last visit, she underwent bone density testing on 06/08/2018. This showed a  T-score of -2.6, which is considered osteoporotic.  She also underwent bilateral diagnostic mammography with tomography at [facility] on 11/30/2018 showing: breast density category C; no evidence of malignancy in either breast.   REVIEW OF SYSTEMS: Teresa Boyd reports doing okay overall. She notes "groin issues" and pain in her legs. She went to an orthopedic doctor and received a shot in her hip. She reports tylenol did not help her pain very much. She states she continues to do yard work during the day. She reports her son had groceries sent to her. The patient denies unusual headaches, visual changes, nausea, vomiting, stiff neck, dizziness, or gait imbalance. There has been no cough, phlegm production, or pleurisy, no chest pain or pressure, and no change in bowel or bladder habits. The patient denies fever, rash, bleeding, unexplained fatigue or unexplained weight loss. A detailed review of systems was otherwise entirely negative.   BREAST CANCER HISTORY: From the original intake note:  [name] herself palpated a mass in her left breast mid-June 2017. She brought it to the attention of Dr. Coletta Memos who obtained screening mammography showing possible abnormalities in both breasts. [name] was then referred to the [facility] where mammography and ultrasonography 03/18/2015 showed in the right breast an 8 mm spiculated mass at the 11:00 position 5 cm from the nipple and a second area of altered architecture also in the upper outer quadrant. In the left breast there was a 2.5 cm is spiculated mass in the upper outer quadrant 4 cm from the nipple. Ultrasound of both axillae were negative.  On 03/17/2016 the patient underwent biopsy of all 3 suspicious areas. In the right breast, both areas showed only fibrocystic change, with no evidence of malignancy ([id number]). This was read as concordant by radiology.  In  the left breast however the final pathology ([id number]) showed an invasive  ductal carcinoma, grade 2 or 3, estrogen receptor 5% positive, with moderate staining intensity, progesterone receptor negative, with an MIB-1 of 5%, and no HER-2 amplification, the signals ratio being 1.53 and the number per cell 3.45.  [name] also has a remote history of colon cancer, in remission for more than 10 years with the most recent colonoscopy 2012 negative.   PAST MEDICAL HISTORY: Past Medical History:  Diagnosis Date   Breast cancer ([place]) 2017   left breast   Colon cancer ([place]) 1994   History of radiation therapy 06/16/16- 07/13/16   Left Breast   Hx of adenomatous colonic polyps    Varicose vein     PAST SURGICAL HISTORY: Past Surgical History:  Procedure Laterality Date   ABDOMINAL HYSTERECTOMY     BREAST BIOPSY Right    BREAST BIOPSY Right    BREAST LUMPECTOMY Left 2017   COLON RESECTION     sigmoid   COLON SURGERY  1994   colon cancer   COLONOSCOPY W/ POLYPECTOMY  last 02/18/11   multiple, prior colon cancer and polyps, 7 mm adenoma 2012   OVARIAN CYST SURGERY     PARTIAL MASTECTOMY WITH NEEDLE LOCALIZATION Left 04/22/2016   Procedure: LEFT PARTIAL MASTECTOMY WITH DOUBLE NEEDLE LOCALIZATION REEXCISION INFERIOR AND LATERAL MARGIN ADJACENT TISSUE TRANSFER;  Surgeon: [name], MD;  Location: [facility];  Service: General;  Laterality: Left;   TONSILLECTOMY      FAMILY HISTORY Family History  Problem Relation Age of Onset   Colon cancer Mother 61   Colon polyps Father 33   Pancreatic cancer Neg Hx    Rectal cancer Neg Hx    Stomach cancer Neg Hx   The patient's father died at age 49 from postoperative complications in the setting of peptic ulcer disease. The patient's mother died at age 58 with colon cancer. The patient had 5 brothers, 2 sisters. One brother died in an automobile accident. One brother has colitis. A maternal aunt developed cancer in her "female organs" in her 48s. The patient has no further information  regarding this. She is not aware of any other cancers in the family aside of course from her own remote colon cancer.  GYNECOLOGIC HISTORY:  No LMP recorded. Patient has had a hysterectomy. Menarche age 51, first live birth age 55. The patient is [place] P1 area in 1955 she underwent total abdominal hysterectomy with bilateral salpingo-oophorectomy. She tried hormone replacement briefly (less than a year). She never took oral contraceptives.  SOCIAL HISTORY:  Wateen still actively manages rental properties. She is widowed and lives by herself with no pets. Her son AMYIA LODWICK is a pulmonologist in [place], [place]. The patient has 4 grandchildren, and no great grandchildren. She attends [employer] locally. 3 of her siblings live in town including of course her sister Joycelyn Schmid, who is a retired [employer] consultant    ADVANCED DIRECTIVES:At the 03/27/2016 visit the patient was given the appropriate forms to complete and notarize at her discretion. She intends to name her son Antony Haste as healthcare power of attorney. He can be reached at [phone]  HEALTH MAINTENANCE: Social History   Tobacco Use   Smoking status: Never Smoker   Smokeless tobacco: Never Used  Substance Use Topics   Alcohol use: No   Drug use: No     Colonoscopy:02/18/2011, [facility]; tubular adenoma with no high-grade dysplasia  Bone density: 06/08/2018, -2.6  Allergies  Allergen Reactions   Sotradecol [Sodium Tetradecyl Sulfate] Anaphylaxis   Celebrex [Celecoxib]    Clindamycin    Conjugated Estrogens    Estradiol Benzoate Other (See Comments)    Caused fast heart rate   Estrogens Conjugated    Hypaque-M [Diatrizone Sodium-Diatrizone Meglumine] Hives    Ct scan IVP dye   Penicillins Hives   Tramadol Nausea Only    Current Outpatient Medications  Medication Sig Dispense Refill   anastrozole (ARIMIDEX) 1 MG tablet Take 1 tablet (1 mg total) by mouth daily. 90 tablet 4   Calcium Carbonate  (CALCIUM-CARB 600 PO) Take by mouth.     cetirizine (ZYRTEC) 10 MG tablet Take 10 mg by mouth daily.     Cholecalciferol (VITAMIN D3) 1000 units CAPS Take by mouth.     gabapentin (NEURONTIN) 100 MG capsule Take 1 capsule (100 mg total) by mouth at bedtime. 90 capsule 4   Grape Seed Extract 100 MG CAPS Take 400 each by mouth.     Multiple Vitamin (MULTIVITAMIN) tablet Take 1 tablet by mouth daily.       Omega-3 Fatty Acids (FISH OIL) 1000 MG CPDR Take by mouth.     No current facility-administered medications for this visit.     OBJECTIVE: Older white woman   There were no vitals filed for this visit.   There is no height or weight on file to calculate BMI.    ECOG FS:1 - Symptomatic but completely ambulatory  LAB RESULTS:  CMP     Component Value Date/Time   NA 141 08/25/2018 1251   NA 139 09/07/2017 1432   K 3.8 08/25/2018 1251   K 4.3 09/07/2017 1432   CL 105 08/25/2018 1251   CO2 26 08/25/2018 1251   CO2 26 09/07/2017 1432   GLUCOSE 148 (H) 08/25/2018 1251   GLUCOSE 100 09/07/2017 1432   BUN 20 08/25/2018 1251   BUN 14.4 09/07/2017 1432   CREATININE 0.96 08/25/2018 1251   CREATININE 0.7 09/07/2017 1432   CALCIUM 9.7 08/25/2018 1251   CALCIUM 9.2 09/07/2017 1432   PROT 7.5 08/25/2018 1251   PROT 7.2 09/07/2017 1432   ALBUMIN 4.1 08/25/2018 1251   ALBUMIN 3.8 09/07/2017 1432   AST 23 08/25/2018 1251   AST 18 09/07/2017 1432   ALT 18 08/25/2018 1251   ALT 13 09/07/2017 1432   ALKPHOS 52 08/25/2018 1251   ALKPHOS 54 09/07/2017 1432   BILITOT 0.6 08/25/2018 1251   BILITOT 0.52 09/07/2017 1432   GFRNONAA 54 (L) 08/25/2018 1251   GFRAA >60 08/25/2018 1251    INo results found for: SPEP, UPEP  Lab Results  Component Value Date   WBC 8.7 08/25/2018   NEUTROABS 6.6 08/25/2018   HGB 13.4 08/25/2018   HCT 41.9 08/25/2018   MCV 98.8 08/25/2018   PLT 241 08/25/2018      Chemistry      Component Value Date/Time   NA 141 08/25/2018 1251   NA 139 09/07/2017  1432   K 3.8 08/25/2018 1251   K 4.3 09/07/2017 1432   CL 105 08/25/2018 1251   CO2 26 08/25/2018 1251   CO2 26 09/07/2017 1432   BUN 20 08/25/2018 1251   BUN 14.4 09/07/2017 1432   CREATININE 0.96 08/25/2018 1251   CREATININE 0.7 09/07/2017 1432      Component Value Date/Time   CALCIUM 9.7 08/25/2018 1251   CALCIUM 9.2 09/07/2017 1432   ALKPHOS 52 08/25/2018 1251   ALKPHOS 54 09/07/2017  1432   AST 23 08/25/2018 1251   AST 18 09/07/2017 1432   ALT 18 08/25/2018 1251   ALT 13 09/07/2017 1432   BILITOT 0.6 08/25/2018 1251   BILITOT 0.52 09/07/2017 1432       No results found for: LABCA2  No components found for: LABCA125  No results for input(s): INR in the last 168 hours.  Urinalysis No results found for: COLORURINE, APPEARANCEUR, LABSPEC, PHURINE, GLUCOSEU, HGBUR, BILIRUBINUR, KETONESUR, PROTEINUR, UROBILINOGEN, NITRITE, LEUKOCYTESUR   STUDIES: She underwent a diagnostic bilateral breast mammogram with CAD and TOMO, breast density category C, showing no evidence of malignancy.  ELIGIBLE FOR AVAILABLE RESEARCH PROTOCOL: PALLAS  ASSESSMENT: 85 y.o. [place] woman status post left breast upper outer quadrant biopsy 03/17/2016 for a clinical T2 N0, stage IIa invasive ductal carcinoma, grade 2 or 3, minimally estrogen receptor positive at 5% with moderate staining intensity, progesterone receptor and HER-2 negative, with an MIB-1 of 5%  (1) Status post left Lumpectomy without sentinel lymph node sampling 04/22/2016 for apT2 pNX, stage II   invasive ductal carcinoma, grade 3, with negative margins  (a) repeat prognostic panel pending  (2) Mammaprint results "high risk" suggests a risk of recurrence with no systemic treatment of 29%   (a) patient offered adjuvant CMF chemotherapy but opted against it   (3) adjuvant radiation 06/16/16-07/13/16 Site/dose:   1) Left breast / 40.05 Gy in 15 fx                         2) Boost / 10 Gy in 10 Fx  (4) started anastrozole  07/22/2016  (a) DEXA scan 06/02/2016 shows osteoporosis, with a T score of -2.7  (b) denosumab Prolia started 10/20/2016  (c) repeat bone density 06/08/2018 showed a T score of -2.6  PLAN: Teresa Boyd is now almost 3 years out from definitive surgery for her breast cancer with no evidence of disease recurrence.  This is very favorable.  She is tolerating anastrozole well and the plan will be to continue that a total of 5 years.  She had several concerns regarding Prolia.  I really do not think the problems she is having with her legs is related.  I think she has restless legs.  I am starting her on gabapentin 100 mg at bedtime and she will call us after few days and let us know if that is working for her or not.  If it is not working we will up the dose to 200 mg at bedtime but I do not want her to have dizziness or falls in the middle the night or feel dizzy in the morning when she wakes up so we will have to be very careful if we do increase the dose  I do not want her to come in just to receive a Prolia injection.  I am going to move that to August and we may not even do it then depending on how we are doing from a pandemic point of view  Regarding that she is taking appropriate precautions  She knows to call for any other issue that may develop before her next visit.  Atianna Haidar, [name], MD  01/31/19 11:15 AM Medical Oncology and Hematology [facility] [address] [place],  [address] Tel. [phone]    Fax. [phone]   I, Wilburn Mylar, am acting as scribe for Dr. [name]. Trayquan Kolakowski.  Lindie Spruce MD, have reviewed the above documentation for accuracy and  completeness, and I agree with the above.

## 2019-01-31 ENCOUNTER — Inpatient Hospital Stay: Payer: Medicare Other | Attending: Oncology | Admitting: Oncology

## 2019-01-31 DIAGNOSIS — C50412 Malignant neoplasm of upper-outer quadrant of left female breast: Secondary | ICD-10-CM | POA: Diagnosis not present

## 2019-01-31 DIAGNOSIS — Z170 Estrogen receptor positive status [ER+]: Secondary | ICD-10-CM | POA: Diagnosis not present

## 2019-01-31 MED ORDER — GABAPENTIN 100 MG PO CAPS: 100.0000 mg | ORAL_CAPSULE | Freq: Every day | ORAL | 4 refills | Status: DC

## 2019-02-02 ENCOUNTER — Telehealth: Payer: Self-pay | Admitting: *Deleted

## 2019-02-02 ENCOUNTER — Telehealth: Payer: Self-pay | Admitting: Oncology

## 2019-02-02 NOTE — Telephone Encounter (Signed)
Tried to reach regarding schedule °

## 2019-02-02 NOTE — Telephone Encounter (Signed)
Appt calendar mailed 

## 2019-02-14 ENCOUNTER — Telehealth: Payer: Self-pay | Admitting: *Deleted

## 2019-02-14 NOTE — Telephone Encounter (Signed)
This RN returned VM left by pt stating " I am calling like Dr Jana Hakim asked me about the medication he started me on- I can not tell a difference at all "  Per above- Weltha stated she has noted no improvement - nor has she felt any side effects.  Symptoms discussed including her concern that the prolia " is what is causing my groin pain "  Mallissa states she is seeing her orthopedist and is scheduled for a left knee injection on 02/28/2019 " which is the one that is jerking "  This RN discussed with pt - medication prescribed, reason for use and MD is titrating dose up slowly to avoid side effects.  Per phone discussion- Madisyn will start 2 tablets ( 200 mg ) tonight.  Destina verbalized instructions back including to call this RN next week with further update or before then if concerns arise.

## 2019-03-21 ENCOUNTER — Other Ambulatory Visit: Payer: Self-pay | Admitting: *Deleted

## 2019-03-21 MED ORDER — GABAPENTIN 100 MG PO CAPS: 200.0000 mg | ORAL_CAPSULE | Freq: Every day | ORAL | 0 refills | Status: DC

## 2019-03-21 MED ORDER — GABAPENTIN 100 MG PO CAPS: 200.0000 mg | ORAL_CAPSULE | Freq: Every day | ORAL | 1 refills | Status: DC

## 2019-03-22 ENCOUNTER — Other Ambulatory Visit: Payer: Self-pay

## 2019-03-22 MED ORDER — GABAPENTIN 100 MG PO CAPS: 200.0000 mg | ORAL_CAPSULE | Freq: Every day | ORAL | 1 refills | Status: DC

## 2019-05-15 ENCOUNTER — Telehealth: Payer: Self-pay | Admitting: Oncology

## 2019-05-15 NOTE — Telephone Encounter (Signed)
R/s appt per 8/21 sch message - pt is aware of appt change

## 2019-05-16 ENCOUNTER — Inpatient Hospital Stay: Payer: Medicare Other | Admitting: Oncology

## 2019-05-16 ENCOUNTER — Inpatient Hospital Stay: Payer: Medicare Other

## 2019-07-03 ENCOUNTER — Other Ambulatory Visit: Payer: Self-pay | Admitting: Adult Health

## 2019-07-03 DIAGNOSIS — C50412 Malignant neoplasm of upper-outer quadrant of left female breast: Secondary | ICD-10-CM

## 2019-07-03 DIAGNOSIS — Z170 Estrogen receptor positive status [ER+]: Secondary | ICD-10-CM

## 2019-07-04 ENCOUNTER — Encounter: Payer: Self-pay | Admitting: Adult Health

## 2019-07-04 ENCOUNTER — Inpatient Hospital Stay (HOSPITAL_BASED_OUTPATIENT_CLINIC_OR_DEPARTMENT_OTHER): Payer: Medicare Other | Admitting: Adult Health

## 2019-07-04 ENCOUNTER — Other Ambulatory Visit: Payer: Self-pay

## 2019-07-04 ENCOUNTER — Inpatient Hospital Stay: Payer: Medicare Other

## 2019-07-04 ENCOUNTER — Inpatient Hospital Stay: Payer: Medicare Other | Attending: Adult Health

## 2019-07-04 VITALS — BP 145/67 | HR 68 | Temp 98.7°F | Resp 18 | Ht 63.0 in | Wt 144.6 lb

## 2019-07-04 DIAGNOSIS — Z79811 Long term (current) use of aromatase inhibitors: Secondary | ICD-10-CM | POA: Diagnosis not present

## 2019-07-04 DIAGNOSIS — Z85038 Personal history of other malignant neoplasm of large intestine: Secondary | ICD-10-CM | POA: Insufficient documentation

## 2019-07-04 DIAGNOSIS — C50412 Malignant neoplasm of upper-outer quadrant of left female breast: Secondary | ICD-10-CM

## 2019-07-04 DIAGNOSIS — M810 Age-related osteoporosis without current pathological fracture: Secondary | ICD-10-CM | POA: Insufficient documentation

## 2019-07-04 DIAGNOSIS — Z79899 Other long term (current) drug therapy: Secondary | ICD-10-CM | POA: Insufficient documentation

## 2019-07-04 DIAGNOSIS — Z923 Personal history of irradiation: Secondary | ICD-10-CM | POA: Insufficient documentation

## 2019-07-04 DIAGNOSIS — Z90710 Acquired absence of both cervix and uterus: Secondary | ICD-10-CM | POA: Insufficient documentation

## 2019-07-04 DIAGNOSIS — Z9079 Acquired absence of other genital organ(s): Secondary | ICD-10-CM | POA: Insufficient documentation

## 2019-07-04 DIAGNOSIS — Z90722 Acquired absence of ovaries, bilateral: Secondary | ICD-10-CM | POA: Diagnosis not present

## 2019-07-04 DIAGNOSIS — Z170 Estrogen receptor positive status [ER+]: Secondary | ICD-10-CM | POA: Insufficient documentation

## 2019-07-04 DIAGNOSIS — Z800 Family history of malignant neoplasm of digestive organs: Secondary | ICD-10-CM | POA: Diagnosis not present

## 2019-07-04 LAB — CBC WITH DIFFERENTIAL (CANCER CENTER ONLY)
Abs Immature Granulocytes: 0.01 10*3/uL (ref 0.00–0.07)
Basophils Absolute: 0 10*3/uL (ref 0.0–0.1)
Basophils Relative: 0 %
Eosinophils Absolute: 0.1 10*3/uL (ref 0.0–0.5)
Eosinophils Relative: 2 %
HCT: 40.7 % (ref 36.0–46.0)
Hemoglobin: 13.1 g/dL (ref 12.0–15.0)
Immature Granulocytes: 0 %
Lymphocytes Relative: 23 %
Lymphs Abs: 1 10*3/uL (ref 0.7–4.0)
MCH: 31.4 pg (ref 26.0–34.0)
MCHC: 32.2 g/dL (ref 30.0–36.0)
MCV: 97.6 fL (ref 80.0–100.0)
Monocytes Absolute: 0.4 10*3/uL (ref 0.1–1.0)
Monocytes Relative: 10 %
Neutro Abs: 2.7 10*3/uL (ref 1.7–7.7)
Neutrophils Relative %: 65 %
Platelet Count: 183 10*3/uL (ref 150–400)
RBC: 4.17 MIL/uL (ref 3.87–5.11)
RDW: 13.3 % (ref 11.5–15.5)
WBC Count: 4.2 10*3/uL (ref 4.0–10.5)
nRBC: 0 % (ref 0.0–0.2)

## 2019-07-04 LAB — CMP (CANCER CENTER ONLY)
ALT: 13 U/L (ref 0–44)
AST: 18 U/L (ref 15–41)
Albumin: 3.9 g/dL (ref 3.5–5.0)
Alkaline Phosphatase: 67 U/L (ref 38–126)
Anion gap: 9 (ref 5–15)
BUN: 12 mg/dL (ref 8–23)
CO2: 28 mmol/L (ref 22–32)
Calcium: 9.6 mg/dL (ref 8.9–10.3)
Chloride: 105 mmol/L (ref 98–111)
Creatinine: 0.77 mg/dL (ref 0.44–1.00)
GFR, Est AFR Am: >60 mL/min (ref >60)
GFR, Estimated: >60 mL/min (ref >60)
Glucose, Bld: 119 mg/dL — ABNORMAL HIGH (ref 70–99)
Potassium: 3.8 mmol/L (ref 3.5–5.1)
Sodium: 142 mmol/L (ref 135–145)
Total Bilirubin: 0.7 mg/dL (ref 0.3–1.2)
Total Protein: 6.9 g/dL (ref 6.5–8.1)

## 2019-07-04 MED ORDER — DENOSUMAB 60 MG/ML ~~LOC~~ SOSY
60.0000 mg | PREFILLED_SYRINGE | Freq: Once | SUBCUTANEOUS | Status: AC
  Administered 2019-07-04: 15:00:00 60 mg via SUBCUTANEOUS

## 2019-07-04 MED ORDER — DENOSUMAB 60 MG/ML ~~LOC~~ SOSY
PREFILLED_SYRINGE | SUBCUTANEOUS | Status: AC
  Filled 2019-07-04: qty 1

## 2019-07-04 NOTE — Patient Instructions (Signed)
Denosumab injection What is this medicine? DENOSUMAB (den oh sue mab) slows bone breakdown. Prolia is used to treat osteoporosis in women after menopause and in men, and in people who are taking corticosteroids for 6 months or more. Xgeva is used to treat a high calcium level due to cancer and to prevent bone fractures and other bone problems caused by multiple myeloma or cancer bone metastases. Xgeva is also used to treat giant cell tumor of the bone. This medicine may be used for other purposes; ask your health care provider or pharmacist if you have questions. COMMON BRAND NAME(S): Prolia, XGEVA What should I tell my health care provider before I take this medicine? They need to know if you have any of these conditions:  dental disease  having surgery or tooth extraction  infection  kidney disease  low levels of calcium or Vitamin D in the blood  malnutrition  on hemodialysis  skin conditions or sensitivity  thyroid or parathyroid disease  an unusual reaction to denosumab, other medicines, foods, dyes, or preservatives  pregnant or trying to get pregnant  breast-feeding How should I use this medicine? This medicine is for injection under the skin. It is given by a health care professional in a hospital or clinic setting. A special MedGuide will be given to you before each treatment. Be sure to read this information carefully each time. For Prolia, talk to your pediatrician regarding the use of this medicine in children. Special care may be needed. For Xgeva, talk to your pediatrician regarding the use of this medicine in children. While this drug may be prescribed for children as young as 13 years for selected conditions, precautions do apply. Overdosage: If you think you have taken too much of this medicine contact a poison control center or emergency room at once. NOTE: This medicine is only for you. Do not share this medicine with others. What if I miss a dose? It is  important not to miss your dose. Call your doctor or health care professional if you are unable to keep an appointment. What may interact with this medicine? Do not take this medicine with any of the following medications:  other medicines containing denosumab This medicine may also interact with the following medications:  medicines that lower your chance of fighting infection  steroid medicines like prednisone or cortisone This list may not describe all possible interactions. Give your health care provider a list of all the medicines, herbs, non-prescription drugs, or dietary supplements you use. Also tell them if you smoke, drink alcohol, or use illegal drugs. Some items may interact with your medicine. What should I watch for while using this medicine? Visit your doctor or health care professional for regular checks on your progress. Your doctor or health care professional may order blood tests and other tests to see how you are doing. Call your doctor or health care professional for advice if you get a fever, chills or sore throat, or other symptoms of a cold or flu. Do not treat yourself. This drug may decrease your body's ability to fight infection. Try to avoid being around people who are sick. You should make sure you get enough calcium and vitamin D while you are taking this medicine, unless your doctor tells you not to. Discuss the foods you eat and the vitamins you take with your health care professional. See your dentist regularly. Brush and floss your teeth as directed. Before you have any dental work done, tell your dentist you are   receiving this medicine. Do not become pregnant while taking this medicine or for 5 months after stopping it. Talk with your doctor or health care professional about your birth control options while taking this medicine. Women should inform their doctor if they wish to become pregnant or think they might be pregnant. There is a potential for serious side  effects to an unborn child. Talk to your health care professional or pharmacist for more information. What side effects may I notice from receiving this medicine? Side effects that you should report to your doctor or health care professional as soon as possible:  allergic reactions like skin rash, itching or hives, swelling of the face, lips, or tongue  bone pain  breathing problems  dizziness  jaw pain, especially after dental work  redness, blistering, peeling of the skin  signs and symptoms of infection like fever or chills; cough; sore throat; pain or trouble passing urine  signs of low calcium like fast heartbeat, muscle cramps or muscle pain; pain, tingling, numbness in the hands or feet; seizures  unusual bleeding or bruising  unusually weak or tired Side effects that usually do not require medical attention (report to your doctor or health care professional if they continue or are bothersome):  constipation  diarrhea  headache  joint pain  loss of appetite  muscle pain  runny nose  tiredness  upset stomach This list may not describe all possible side effects. Call your doctor for medical advice about side effects. You may report side effects to FDA at 1-800-FDA-1088. Where should I keep my medicine? This medicine is only given in a clinic, doctor's office, or other health care setting and will not be stored at home. NOTE: This sheet is a summary. It may not cover all possible information. If you have questions about this medicine, talk to your doctor, pharmacist, or health care provider.  2020 Elsevier/Gold Standard (2018-01-14 16:10:44)

## 2019-07-04 NOTE — Progress Notes (Signed)
[facility]  Telephone:([phone] Fax:([phone]) [phone]     ID: Teresa Boyd DOB: Oct 20, 1930  MR#: [id number]  FUQ#:[id number]  Patient Care Team: Bartholome Bill, MD as PCP - General (Family Medicine) [name], MD as Consulting Physician (General Surgery) [name], MD as Consulting Physician (Gastroenterology) Magrinat, [name], MD as Consulting Physician (Oncology) [name], MD as Attending Physician (Radiation Oncology) [name], MD as Consulting Physician (Family Medicine) [name], MD as Consulting Physician (Obstetrics and Gynecology) [name], MD as Consulting Physician (Orthopedic Surgery) Delice Bison, Charlestine Massed, NP as Nurse Practitioner (Hematology and Oncology) OTHER MD:   CHIEF COMPLAINT: Estrogen receptor positive invasive breast cancer  CURRENT TREATMENT: Anastrozole, denosumab/Prolia   INTERVAL HISTORY: Teresa Boyd was contacted today for follow-up of her estrogen receptor positive breast cancer.   She continues on anastrozole, which she tolerates well. She denies hot flashes.  She also receives denosumab/Prolia.  Her most recent dose was 08/25/2018.  Since her last visit, she underwent bone density testing on 06/08/2018. This showed a T-score of -2.6, which is considered osteoporotic.  She also underwent bilateral diagnostic mammography with tomography at [facility] on 11/30/2018 showing: breast density category C; no evidence of malignancy in either breast.    REVIEW OF SYSTEMS: Teresa Boyd reports doing okay overall. She notes that she is feeling well and sees her PCP regularly.  She tolerates her medications well and has no issues with them.  She denies any fever, chills, chest pain, palpitations, cough, shortness of breath, nausea, vomiting, bowel/bladder changes.  A detailed ROS was otherwise non contributory,    BREAST CANCER HISTORY: From the original intake note:  Teresa Boyd herself palpated a mass in  her left breast mid-June 2017. She brought it to the attention of Dr. Coletta Memos who obtained screening mammography showing possible abnormalities in both breasts. Teresa Boyd was then referred to the [facility] where mammography and ultrasonography 03/18/2015 showed in the right breast an 8 mm spiculated mass at the 11:00 position 5 cm from the nipple and a second area of altered architecture also in the upper outer quadrant. In the left breast there was a 2.5 cm is spiculated mass in the upper outer quadrant 4 cm from the nipple. Ultrasound of both axillae were negative.  On 03/17/2016 the patient underwent biopsy of all 3 suspicious areas. In the right breast, both areas showed only fibrocystic change, with no evidence of malignancy (SAA [id number] and [id number]). This was read as concordant by radiology.  In the left breast however the final pathology (SAA [id number]) showed an invasive ductal carcinoma, grade 2 or 3, estrogen receptor 5% positive, with moderate staining intensity, progesterone receptor negative, with an MIB-1 of 5%, and no HER-2 amplification, the signals ratio being 1.53 and the number per cell 3.45.  Teresa Boyd also has a remote history of colon cancer, in remission for more than 10 years with the most recent colonoscopy 2012 negative.   PAST MEDICAL HISTORY: Past Medical History:  Diagnosis Date  . Breast cancer ([facility]) 2017   left breast  . Colon cancer ([facility]) 1994  . History of radiation therapy 06/16/16- 07/13/16   Left Breast  . Hx of adenomatous colonic polyps   . Varicose vein     PAST SURGICAL HISTORY: Past Surgical History:  Procedure Laterality Date  . ABDOMINAL HYSTERECTOMY    . BREAST BIOPSY Right   . BREAST BIOPSY Right   . BREAST LUMPECTOMY Left 2017  . COLON RESECTION  sigmoid  . COLON SURGERY  1994   colon cancer  . COLONOSCOPY W/ POLYPECTOMY  last 02/18/11   multiple, prior colon cancer and polyps, 7 mm adenoma 2012  . OVARIAN CYST SURGERY    . PARTIAL  MASTECTOMY WITH NEEDLE LOCALIZATION Left 04/22/2016   Procedure: LEFT PARTIAL MASTECTOMY WITH DOUBLE NEEDLE LOCALIZATION REEXCISION INFERIOR AND LATERAL MARGIN ADJACENT TISSUE TRANSFER;  Surgeon: [name], MD;  Location: [facility];  Service: General;  Laterality: Left;  . TONSILLECTOMY      FAMILY HISTORY Family History  Problem Relation Age of Onset  . Colon cancer Mother 54  . Colon polyps Father 57  . Pancreatic cancer Neg Hx   . Rectal cancer Neg Hx   . Stomach cancer Neg Hx   The patient's father died at age 54 from postoperative complications in the setting of peptic ulcer disease. The patient's mother died at age 71 with colon cancer. The patient had 5 brothers, 2 sisters. One brother died in an automobile accident. One brother has colitis. A maternal aunt developed cancer in her "female organs" in her 17s. The patient has no further information regarding this. She is not aware of any other cancers in the family aside of course from her own remote colon cancer.  GYNECOLOGIC HISTORY:  No LMP recorded. Patient has had a hysterectomy. Menarche age 44, first live birth age 44. The patient is [name] area in 1955 she underwent total abdominal hysterectomy with bilateral salpingo-oophorectomy. She tried hormone replacement briefly (less than a year). She never took oral contraceptives.  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 She is widowed and lives by herself with no pets. Her son Teresa Boyd is a pulmonologist in [place], [place]. The patient has 4 grandchildren, and no great grandchildren. She attends [employer] locally. 3 of her siblings live in town including of course her sister Teresa Boyd, who is a retired Recruitment consultant    ADVANCED DIRECTIVES:At the 03/27/2016 visit the patient was given the appropriate forms to complete and notarize at her discretion. She intends to name her son Teresa Boyd as healthcare power of attorney. He can  be reached at [phone]  HEALTH MAINTENANCE: Social History   Tobacco Use  . Smoking status: Never Smoker  . Smokeless tobacco: Never Used  Substance Use Topics  . Alcohol use: No  . Drug use: No     Colonoscopy:02/18/2011, [facility]; tubular adenoma with no high-grade dysplasia  Bone density: 06/08/2018, -2.6   Allergies  Allergen Reactions  . Sotradecol [Sodium Tetradecyl Sulfate] Anaphylaxis  . Celebrex [Celecoxib]   . Clindamycin   . Conjugated Estrogens   . Estradiol Benzoate Other (See Comments)    Caused fast heart rate  . Estrogens Conjugated   . Hypaque-M [Diatrizone Sodium-Diatrizone Meglumine] Hives    Ct scan IVP dye  . Penicillins Hives  . Tramadol Nausea Only    Current Outpatient Medications  Medication Sig Dispense Refill  . anastrozole (ARIMIDEX) 1 MG tablet Take 1 tablet (1 mg total) by mouth daily. 90 tablet 4  . Calcium Carbonate (CALCIUM-CARB 600 PO) Take by mouth.    . cetirizine (ZYRTEC) 10 MG tablet Take 10 mg by mouth daily.    . Cholecalciferol (VITAMIN D3) 1000 units CAPS Take by mouth.    . gabapentin (NEURONTIN) 100 MG capsule Take 2 capsules (200 mg total) by mouth at bedtime. 180 capsule 1  . Grape Seed Extract 100 MG CAPS Take 400 each  by mouth.    . Multiple Vitamin (MULTIVITAMIN) tablet Take 1 tablet by mouth daily.       No current facility-administered medications for this visit.     OBJECTIVE: Older white woman   Vitals:   07/04/19 1326  BP: (!) 145/67  Pulse: 68  Resp: 18  Temp: 98.7 F (37.1 C)  SpO2: 98%     Body mass index is 25.61 kg/m.    ECOG FS:1 - Symptomatic but completely ambulatory GENERAL: Patient is a well appearing female in no acute distress HEENT:  Sclerae anicteric.  Oropharynx clear and moist. No ulcerations or evidence of oropharyngeal candidiasis. Neck is supple.  NODES:  No cervical, supraclavicular, or axillary lymphadenopathy palpated.  BREAST EXAM:  Left breast s/p lumpectomy, no sign of  recurrence, right breast benign LUNGS:  Clear to auscultation bilaterally.  No wheezes or rhonchi. HEART:  Regular rate and rhythm. No murmur appreciated. ABDOMEN:  Soft, nontender.  Positive, normoactive bowel sounds. No organomegaly palpated. MSK:  No focal spinal tenderness to palpation. Full range of motion bilaterally in the upper extremities. EXTREMITIES:  No peripheral edema.   SKIN:  Clear with no obvious rashes or skin changes. No nail dyscrasia. NEURO:  Nonfocal. Well oriented.  Appropriate affect.   LAB RESULTS:  CMP     Component Value Date/Time   NA 142 07/04/2019 1300   NA 139 09/07/2017 1432   K 3.8 07/04/2019 1300   K 4.3 09/07/2017 1432   CL 105 07/04/2019 1300   CO2 28 07/04/2019 1300   CO2 26 09/07/2017 1432   GLUCOSE 119 (H) 07/04/2019 1300   GLUCOSE 100 09/07/2017 1432   BUN 12 07/04/2019 1300   BUN 14.4 09/07/2017 1432   CREATININE 0.77 07/04/2019 1300   CREATININE 0.7 09/07/2017 1432   CALCIUM 9.6 07/04/2019 1300   CALCIUM 9.2 09/07/2017 1432   PROT 6.9 07/04/2019 1300   PROT 7.2 09/07/2017 1432   ALBUMIN 3.9 07/04/2019 1300   ALBUMIN 3.8 09/07/2017 1432   AST 18 07/04/2019 1300   AST 18 09/07/2017 1432   ALT 13 07/04/2019 1300   ALT 13 09/07/2017 1432   ALKPHOS 67 07/04/2019 1300   ALKPHOS 54 09/07/2017 1432   BILITOT 0.7 07/04/2019 1300   BILITOT 0.52 09/07/2017 1432   GFRNONAA >60 07/04/2019 1300   GFRAA >60 07/04/2019 1300    INo results found for: SPEP, UPEP  Lab Results  Component Value Date   WBC 4.2 07/04/2019   NEUTROABS 2.7 07/04/2019   HGB 13.1 07/04/2019   HCT 40.7 07/04/2019   MCV 97.6 07/04/2019   PLT 183 07/04/2019      Chemistry      Component Value Date/Time   NA 142 07/04/2019 1300   NA 139 09/07/2017 1432   K 3.8 07/04/2019 1300   K 4.3 09/07/2017 1432   CL 105 07/04/2019 1300   CO2 28 07/04/2019 1300   CO2 26 09/07/2017 1432   BUN 12 07/04/2019 1300   BUN 14.4 09/07/2017 1432   CREATININE 0.77 07/04/2019  1300   CREATININE 0.7 09/07/2017 1432      Component Value Date/Time   CALCIUM 9.6 07/04/2019 1300   CALCIUM 9.2 09/07/2017 1432   ALKPHOS 67 07/04/2019 1300   ALKPHOS 54 09/07/2017 1432   AST 18 07/04/2019 1300   AST 18 09/07/2017 1432   ALT 13 07/04/2019 1300   ALT 13 09/07/2017 1432   BILITOT 0.7 07/04/2019 1300   BILITOT 0.52 09/07/2017 1432  No results found for: LABCA2  No components found for: YHTMB311  No results for input(s): INR in the last 168 hours.  Urinalysis No results found for: COLORURINE, APPEARANCEUR, LABSPEC, PHURINE, GLUCOSEU, HGBUR, BILIRUBINUR, KETONESUR, PROTEINUR, UROBILINOGEN, NITRITE, LEUKOCYTESUR   STUDIES: She underwent a diagnostic bilateral breast mammogram with CAD and TOMO, breast density category C, showing no evidence of malignancy.  ELIGIBLE FOR AVAILABLE RESEARCH PROTOCOL: P[place]AS  ASSESSMENT: 85 y.o.  woman status post left breast upper outer quadrant biopsy 03/17/2016 for a clinical T2 N0, stage IIa invasive ductal carcinoma, grade 2 or 3, minimally estrogen receptor positive at 5% with moderate staining intensity, progesterone receptor and HER-2 negative, with an MIB-1 of 5%  (1) Status post left Lumpectomy without sentinel lymph node sampling 04/22/2016 for apT2 pNX, stage II   invasive ductal carcinoma, grade 3, with negative margins  (a) repeat prognostic panel pending  (2) Mammaprint results "high risk" suggests a risk of recurrence with no systemic treatment of 29%   (a) patient offered adjuvant CMF chemotherapy but opted against it   (3) adjuvant radiation 06/16/16-07/13/16 Site/dose:   1) Left breast / 40.05 Gy in 15 fx                         2) Boost / 10 Gy in 10 Fx  (4) started anastrozole 07/22/2016  (a) DEXA scan 06/02/2016 shows osteoporosis, with a T score of -2.7  (b) denosumab Prolia started 10/20/2016  (c) repeat bone density 06/08/2018 showed a T score of -2.6  PLAN: Kerilyn is doing well today.   She has no clinical or radiographic sign of breast cancer recurrence.  She is taking anastrozole daily and is tolerating it well.  She will continue this.  Her next mammogram is due in 11/2019.   Camron also has osteoporosis.  She receives Prolia every 6 months and tolerates this well.  She was also counseled on calcium, vitamin d and weight bearing exercises.  She is due for repeat bone density testing in 05/2020.  Vivan and I reviewed healthy diet and exercise in detail.  I recommended she stay up to date with her PCP visits and skin cancer screening.  We will see her back in 6 months for labs, f/u with Dr. Jana Hakim, and her next injection.  She was recommended to continue with the appropriate pandemic precautions. She knows to call for any questions that may arise between now and her next appointment.  We are happy to see her sooner if needed.  A total of (20) minutes of face-to-face time was spent with this patient with greater than 50% of that time in counseling and care-coordination.    Wilber Bihari, NP  07/04/19 1:56 PM Medical Oncology and Hematology [facility] [address] [place], [place] [address] Tel. [phone]    Fax. [phone]

## 2019-07-05 ENCOUNTER — Telehealth: Payer: Self-pay | Admitting: Adult Health

## 2019-07-05 NOTE — Telephone Encounter (Signed)
I left a message regarding schedule  

## 2019-07-08 ENCOUNTER — Other Ambulatory Visit: Payer: Self-pay | Admitting: Oncology

## 2019-08-29 ENCOUNTER — Ambulatory Visit: Payer: Medicare Other | Attending: Pulmonary Disease | Admitting: Physical Therapy

## 2019-08-29 ENCOUNTER — Other Ambulatory Visit: Payer: Self-pay

## 2019-08-29 ENCOUNTER — Encounter: Payer: Self-pay | Admitting: Physical Therapy

## 2019-08-29 DIAGNOSIS — R2689 Other abnormalities of gait and mobility: Secondary | ICD-10-CM | POA: Diagnosis present

## 2019-08-29 DIAGNOSIS — M6281 Muscle weakness (generalized): Secondary | ICD-10-CM | POA: Insufficient documentation

## 2019-08-29 DIAGNOSIS — M25651 Stiffness of right hip, not elsewhere classified: Secondary | ICD-10-CM | POA: Diagnosis present

## 2019-08-29 DIAGNOSIS — R293 Abnormal posture: Secondary | ICD-10-CM | POA: Diagnosis present

## 2019-08-29 NOTE — Therapy (Signed)
[facility] [address] [place], [place], [address] Phone: [phone]   Fax:  [phone]  Physical Therapy Evaluation  Patient Details  Name: Teresa Boyd MRN: [id number] Date of Birth: 1931/08/15 Referring Provider (PT): Dr Delford Field   Encounter Date: 08/29/2019  PT End of Session - 08/29/19 1052    Visit Number  1    Number of Visits  6    Date for PT Re-Evaluation  10/10/19    Authorization Type  MRC/tricare    PT Start Time  [id number]    PT Stop Time  1139    PT Time Calculation (min)  47 min    Activity Tolerance  Patient tolerated treatment well    Behavior During Therapy  [facility] for tasks assessed/performed;Anxious   pt had a lot of questions about if PT will help and why      Past Medical History:  Diagnosis Date  . Breast cancer ([facility]) 2017   left breast  . Colon cancer ([facility]) 1994  . History of radiation therapy 06/16/16- 07/13/16   Left Breast  . Hx of adenomatous colonic polyps   . Varicose vein     Past Surgical History:  Procedure Laterality Date  . ABDOMINAL HYSTERECTOMY    . BREAST BIOPSY Right   . BREAST BIOPSY Right   . BREAST LUMPECTOMY Left 2017  . COLON RESECTION     sigmoid  . COLON SURGERY  1994   colon cancer  . COLONOSCOPY W/ POLYPECTOMY  last 02/18/11   multiple, prior colon cancer and polyps, 7 mm adenoma 2012  . OVARIAN CYST SURGERY    . PARTIAL MASTECTOMY WITH NEEDLE LOCALIZATION Left 04/22/2016   Procedure: LEFT PARTIAL MASTECTOMY WITH DOUBLE NEEDLE LOCALIZATION REEXCISION INFERIOR AND LATERAL MARGIN ADJACENT TISSUE TRANSFER;  Surgeon: [name], MD;  Location: [facility];  Service: General;  Laterality: Left;  . TONSILLECTOMY      There were no vitals filed for this visit.   Subjective Assessment - 08/29/19 1052    Subjective  Pt reports that her son said she needs to work on her balance.  He noticed a difference after starting the new meds for  osteoporsosi. She is currenlty having trouble sleeping at night d/t pain in the back of her legs.  Yesterday when she got up she was afraid she was going to fall.  Also having some trouble lifting the legs up into a high car. Currently she does a lot of walking, usually no pain during the day, only at the night time when she stops walking.    How long can you sit comfortably?  not limited    How long can you stand comfortably?  fine    How long can you walk comfortably?  is able to do all her activity /walking without trouble.    Patient Stated Goals  son wanted her to come - she is not sure she can be helped. Really feels like it is from the new osteoporosis meds.    Currently in Pain?  No/denies   comes at night when she tries to sleep        [facility] PT Assessment - 08/29/19 0001      Assessment   Medical Diagnosis  Gait instability and weakness    Referring Provider (PT)  Dr Delford Field    Onset Date/Surgical Date  --   after her injections for osteoposis   Hand Dominance  Left  Next MD Visit  --   not scheduled - order is from her son   Prior Therapy  none      Precautions   Precaution Comments  osteoporosis      Balance Screen   Has the patient fallen in the past 6 months  No    Has the patient had a decrease in activity level because of a fear of falling?   Yes   only since left knee gave a little   Is the patient reluctant to leave their home because of a fear of falling?   No      Home Film/video editor residence    [place]  One level      Prior Function   Level of [place]  Retired    Leisure  sing in church - climbing up some steps, Tax adviser tests  Squat;Single leg stance      Squat   Comments  slight adduction       Single Leg Stance   Comments  Lt 3 sec, Rt 8 sec      Posture/Postural Control   Posture/Postural Control  Postural  limitations    Postural Limitations  Rounded Shoulders;Forward head;Increased thoracic kyphosis   Rt shoulder complex rotated back shift to Rt    Posture Comments  pt reports h/o scoliosis      ROM / Strength   AROM / PROM / Strength  AROM;Strength      AROM   Overall AROM Comments  Rt hip rotation limited - pt reports she was told the hip had no cartilage.       Strength   Strength Assessment Site  Hip;Knee;Ankle    Right/Left Hip  Right;Left    Right Hip Flexion  4/5    Right Hip Extension  4+/5    Right Hip ABduction  4/5    Left Hip Flexion  4+/5    Left Hip Extension  4+/5    Left Hip ABduction  4-/5    Right/Left Knee  --   bilat 5/5   Right/Left Ankle  --   bilat 5/5     Flexibility   Soft Tissue Assessment /Muscle Length  yes   bilat hip flexors tight   Hamstrings  bilat WNL    Quadriceps  prone knee flexion Lt 115, Rt 128      Palpation   Palpation comment  tight and tender in Rt hip adductors      Standardized Balance Assessment   Standardized Balance Assessment  Berg Balance Test      Berg Balance Test   Sit to Stand  Able to stand without using hands and stabilize independently    Standing Unsupported  Able to stand safely 2 minutes    Sitting with Back Unsupported but Feet Supported on Floor or Stool  Able to sit safely and securely 2 minutes    Stand to Sit  Sits safely with minimal use of hands    Transfers  Able to transfer safely, minor use of hands    Standing Unsupported with Eyes Closed  Able to stand 10 seconds safely    Standing Unsupported with Feet Together  Able to place feet together independently and stand 1 minute safely    From Standing, Reach Forward with Outstretched Arm  Can reach confidently >  25 cm (10")    From Standing Position, Pick up Object from [place] to pick up shoe safely and easily    From Standing Position, Turn to Look Behind Over each Shoulder  Looks behind from both sides and weight shifts well    Turn 360 Degrees   Able to turn 360 degrees safely in 4 seconds or less   bilat directions 4 sec   Standing Unsupported, Alternately Place Feet on Step/Stool  Able to complete >2 steps/needs minimal assist   pt performed task in 9 sec - speed made her lose balance 2x   Standing Unsupported, One Foot in Front  Able to take small step independently and hold 30 seconds    Standing on One Leg  Able to lift leg independently and hold equal to or more than 3 seconds    Total Score  49                Objective measurements completed on examination: See above findings.      [facility] Adult PT Treatment/Exercise - 08/29/19 0001      Exercises   Exercises  Knee/Hip      Knee/Hip Exercises: Stretches   Quad Stretch  Both;60 seconds   prone with strap   Quad Stretch Limitations  reviewed technique and explained the purpose of using a strap and not just doing a hamstring curl    Gastroc Stretch  Both;60 seconds   at wall standing     Knee/Hip Exercises: Standing   SLS  bilat at wall for safety      Knee/Hip Exercises: Sidelying   Hip ABduction  Strengthening;Both   30 reps - cues for form            PT Education - 08/29/19 1147    Education Details  HEP, initial osteoporosis education, POC    Person(s) Educated  Patient    Methods  Explanation;Demonstration;Handout    Comprehension  Verbal cues required;Tactile cues required;Need further instruction;Returned demonstration;Verbalized understanding          PT Long Term Goals - 08/29/19 1230      PT LONG TERM GOAL #1   Title  I with HEP for balance, strengthening and posture ( 10/10/2019)    Time  6    Period  Weeks    Status  New    Target Date  10/10/19      PT LONG TERM GOAL #2   Title  increase lower body flexibility to WNL with patient reporting =/> 75% reduction of leg cramping at night ( 10/10/2019)    Time  6    Period  Weeks    Status  New    Target Date  10/10/19      PT LONG TERM GOAL #3   Title  improve bilat hip  strength 5/5 with reports of no buckling of the legs during ambulation ( 10/10/2019)    Time  6    Period  Weeks    Status  New    Target Date  10/10/19      PT LONG TERM GOAL #4   Title  improve BERG =/> 52/56 to decrease risk of falls    Time  6    Period  Weeks    Status  New    Target Date  10/10/19      PT LONG TERM GOAL #5   Title  verbalize osteoporosis contraindications and recommended txs ( 10/10/2019)    Time  6    Period  Weeks    Status  New    Target Date  10/10/19             Plan - 08/29/19 1222    Clinical Impression Statement  85 yo independent female presents for eval because her son noticed a functional change when she was visiting with him over the holidays.  She reports pain in her legs at night when attempting to sleep and that her Lt LE buckeled slightly on her once.  Son reports her requiring assistacne to lift legs into vehicle as well as changes in balance.  Pt feels like some of this is from side affect of new med for osteoporosis, was recently started on gabepentin to help with leg pain.  She does have weakness in bilat hips. very tight in her hip flexors as well as quads.  BERG balance assessment has her at slight risk for falls.  Pt has postural changes associated with osteoporsis which will change her center of gravity.  She would benefit from PT to address these defecits, restore PLOF and prevent risk of injury/falls as she lives alone.    Personal Factors and Comorbidities  Age;Comorbidity 3+    Comorbidities  osteoporosis, Rt hip severe arthristis with limited rotation, h/o breast cancer 2017 and colona cancer 1994    Examination-Activity Limitations  Locomotion Level    Examination-Participation Restrictions  Church    Stability/Clinical Decision Making  Stable/Uncomplicated    Clinical Decision Making  Low    Rehab Potential  Good    PT Frequency  1x / week   per pt request, she is unable to come twice a week   PT Duration  6 weeks    PT  Treatment/Interventions  Iontophoresis 4mg /ml Dexamethasone;Patient/family education;Functional mobility training;Therapeutic activities;Moist Heat;Ultrasound;Therapeutic exercise;Passive range of motion;Dry needling;Taping;Manual techniques;Neuromuscular re-education;Cryotherapy;Balance training    PT Next Visit Plan  lower body strengthening, postural correction ex/thoracic openers, osteoporosis precautions and recommendation. hip flexibility    PT Home Exercise Plan  no trunk flexion - osteoporosis    Consulted and Agree with Plan of Care  Patient       Patient will benefit from skilled therapeutic intervention in order to improve the following deficits and impairments:  Pain, Increased muscle spasms, Decreased strength, Postural dysfunction, Decreased knowledge of precautions  Visit Diagnosis: Other abnormalities of gait and mobility  Muscle weakness (generalized)  Stiffness of right hip, not elsewhere classified  Abnormal posture     Problem List Patient Active Problem List   Diagnosis Date Noted  . Malignant neoplasm of upper-outer quadrant of left breast in female, estrogen receptor positive ([facility]) 03/27/2016  . HEMORRHOIDS-EXTERNAL 11/08/2008  . HIATAL HERNIA 02/16/2007  . Osteoporosis 02/16/2007  . COLON CANCER, HX OF 02/16/2007    Jeral Pinch PT  08/29/2019, 12:37 PM  [address] [place] [place], [place], [address] Phone: [phone]   Fax:  [phone]  Name: Teresa Boyd MRN: [id number] Date of Birth: 03-Sep-1931

## 2019-08-30 NOTE — Addendum Note (Signed)
Addended by: Paquita Printy, Manuela Schwartz E on: 08/30/2019 08:32 AM   Modules accepted: Orders

## 2019-09-05 ENCOUNTER — Ambulatory Visit: Payer: Medicare Other | Admitting: Physical Therapy

## 2019-09-05 ENCOUNTER — Other Ambulatory Visit: Payer: Self-pay

## 2019-09-05 DIAGNOSIS — M6281 Muscle weakness (generalized): Secondary | ICD-10-CM

## 2019-09-05 DIAGNOSIS — R2689 Other abnormalities of gait and mobility: Secondary | ICD-10-CM | POA: Diagnosis not present

## 2019-09-05 DIAGNOSIS — M25651 Stiffness of right hip, not elsewhere classified: Secondary | ICD-10-CM

## 2019-09-05 DIAGNOSIS — R293 Abnormal posture: Secondary | ICD-10-CM

## 2019-09-05 NOTE — Therapy (Signed)
[place], [place], [address] Phone: [phone]   Fax:  [phone]  Physical Therapy Treatment  Patient Details  Name: Teresa Boyd MRN: [id number] Date of Birth: 22-Oct-1930 Referring Provider (PT): Dr Delford Field   Encounter Date: 09/05/2019  PT End of Session - 09/05/19 1233    Visit Number  2    Date for PT Re-Evaluation  10/10/19    Authorization Type  MRC/tricare    PT Start Time  1233    PT Stop Time  1315    PT Time Calculation (min)  42 min    Activity Tolerance  Patient tolerated treatment well    Behavior During Therapy  [facility] for tasks assessed/performed       Past Medical History:  Diagnosis Date  . Breast cancer ([place]) 2017   left breast  . Colon cancer ([place]) 1994  . History of radiation therapy 06/16/16- 07/13/16   Left Breast  . Hx of adenomatous colonic polyps   . Varicose vein     Past Surgical History:  Procedure Laterality Date  . ABDOMINAL HYSTERECTOMY    . BREAST BIOPSY Right   . BREAST BIOPSY Right   . BREAST LUMPECTOMY Left 2017  . COLON RESECTION     sigmoid  . COLON SURGERY  1994   colon cancer  . COLONOSCOPY W/ POLYPECTOMY  last 02/18/11   multiple, prior colon cancer and polyps, 7 mm adenoma 2012  . OVARIAN CYST SURGERY    . PARTIAL MASTECTOMY WITH NEEDLE LOCALIZATION Left 04/22/2016   Procedure: LEFT PARTIAL MASTECTOMY WITH DOUBLE NEEDLE LOCALIZATION REEXCISION INFERIOR AND LATERAL MARGIN ADJACENT TISSUE TRANSFER;  Surgeon: [name], MD;  Location: [facility];  Service: General;  Laterality: Left;  . TONSILLECTOMY      There were no vitals filed for this visit.  Subjective Assessment - 09/05/19 1233    Subjective  Pt reports she is doing her HEP and only has trouble with the balance one    Patient Stated Goals  son wanted her to come - she is not sure she can be helped. Really feels like it is from the new osteoporosis meds.    Currently in Pain?  No/denies         [facility] PT Assessment - 09/05/19 0001      Single Leg Stance   Comments  Lt 4 sec, Rt 12 sec                   [facility] Adult PT Treatment/Exercise - 09/05/19 0001      Neuro Re-ed    Neuro Re-ed Details   standing on BOSU ball with UE assist as needed, head turns and balance      Knee/Hip Exercises: Stretches   Other Knee/Hip Stretches  supine on half pool noodle - thoracic openers and shoulder stretches to work on getting upright posture       Knee/Hip Exercises: Aerobic   Nustep  L4x5'      Knee/Hip Exercises: Machines for Strengthening   Cybex Knee Extension  2x15, 15#, VC for form   could most likely do with 20# at next visit    Cybex Knee Flexion  2x15, 15#     Cybex Leg Press  10 reps, 20# attempted 40# to heavy                  PT Long Term Goals - 08/29/19 1230  PT LONG TERM GOAL #1   Title  I with HEP for balance, strengthening and posture ( 10/10/2019)    Time  6    Period  Weeks    Status  New    Target Date  10/10/19      PT LONG TERM GOAL #2   Title  increase lower body flexibility to WNL with patient reporting =/> 75% reduction of leg cramping at night ( 10/10/2019)    Time  6    Period  Weeks    Status  New    Target Date  10/10/19      PT LONG TERM GOAL #3   Title  improve bilat hip strength 5/5 with reports of no buckling of the legs during ambulation ( 10/10/2019)    Time  6    Period  Weeks    Status  New    Target Date  10/10/19      PT LONG TERM GOAL #4   Title  improve BERG =/> 52/56 to decrease risk of falls    Time  6    Period  Weeks    Status  New    Target Date  10/10/19      PT LONG TERM GOAL #5   Title  verbalize osteoporosis contraindications and recommended txs ( 10/10/2019)    Time  6    Period  Weeks    Status  New    Target Date  10/10/19            Plan - 09/05/19 1246    Clinical Impression Statement  This is Teresa Boyd's second visit, she is doing well  with initial HEP however still challenged with single leg stance. She can most likely use more wt with hamstring curls. Had a slight irritation with leg extension in her knees, settled down when she was done with knee extension. She reports that the thoracic openers felt good on her back and shoulder - facilitating upright posture should help with balance.  She will be ready to have her HEP advanced at her next visit.  She appreciates explaination of the exercises so she can under stand how they will help her.    Comorbidities  osteoporosis, Rt hip severe arthristis with limited rotation, h/o breast cancer 2017 and colon cancer 1994    Rehab Potential  Good    PT Frequency  1x / week    PT Duration  6 weeks    PT Treatment/Interventions  Iontophoresis 4mg /ml Dexamethasone;Patient/family education;Functional mobility training;Therapeutic activities;Moist Heat;Ultrasound;Therapeutic exercise;Passive range of motion;Dry needling;Taping;Manual techniques;Neuromuscular re-education;Cryotherapy;Balance training    PT Next Visit Plan  progress HEP    PT Home Exercise Plan  no trunk flexion - osteoporosis    Consulted and Agree with Plan of Care  Patient       Patient will benefit from skilled therapeutic intervention in order to improve the following deficits and impairments:  Pain, Increased muscle spasms, Decreased strength, Postural dysfunction, Decreased knowledge of precautions  Visit Diagnosis: Other abnormalities of gait and mobility  Muscle weakness (generalized)  Stiffness of right hip, not elsewhere classified  Abnormal posture     Problem List Patient Active Problem List   Diagnosis Date Noted  . Malignant neoplasm of upper-outer quadrant of left breast in female, estrogen receptor positive ([place]) 03/27/2016  . HEMORRHOIDS-EXTERNAL 11/08/2008  . HIATAL HERNIA 02/16/2007  . Osteoporosis 02/16/2007  . COLON CANCER, HX OF 02/16/2007    SusanShave rPT  09/05/2019, 1:21 PM  [facility]  [address] [place], [place], [address] Phone: [phone]   Fax:  [phone]  Name: Teresa Boyd MRN: [id number] Date of Birth: 12-12-1930

## 2019-09-12 ENCOUNTER — Encounter: Payer: Self-pay | Admitting: Physical Therapy

## 2019-09-12 ENCOUNTER — Ambulatory Visit: Payer: Medicare Other | Admitting: Physical Therapy

## 2019-09-12 ENCOUNTER — Other Ambulatory Visit: Payer: Self-pay

## 2019-09-12 DIAGNOSIS — M6281 Muscle weakness (generalized): Secondary | ICD-10-CM

## 2019-09-12 DIAGNOSIS — R293 Abnormal posture: Secondary | ICD-10-CM

## 2019-09-12 DIAGNOSIS — R2689 Other abnormalities of gait and mobility: Secondary | ICD-10-CM | POA: Diagnosis not present

## 2019-09-12 DIAGNOSIS — M25651 Stiffness of right hip, not elsewhere classified: Secondary | ICD-10-CM

## 2019-09-12 NOTE — Therapy (Signed)
[place], [place], [address] Phone: [phone]   Fax:  [phone]  Physical Therapy Treatment  Patient Details  Name: Teresa Boyd MRN: [id number] Date of Birth: 1931/05/09 Referring Provider (PT): Dr Delford Field   Encounter Date: 09/12/2019  PT End of Session - 09/12/19 1148    Visit Number  3    Date for PT Re-Evaluation  10/10/19    PT Start Time  1055    PT Stop Time  1145    PT Time Calculation (min)  50 min    Activity Tolerance  Patient tolerated treatment well    Behavior During Therapy  [facility] for tasks assessed/performed       Past Medical History:  Diagnosis Date  . Breast cancer ([place]) 2017   left breast  . Colon cancer ([place]) 1994  . History of radiation therapy 06/16/16- 07/13/16   Left Breast  . Hx of adenomatous colonic polyps   . Varicose vein     Past Surgical History:  Procedure Laterality Date  . ABDOMINAL HYSTERECTOMY    . BREAST BIOPSY Right   . BREAST BIOPSY Right   . BREAST LUMPECTOMY Left 2017  . COLON RESECTION     sigmoid  . COLON SURGERY  1994   colon cancer  . COLONOSCOPY W/ POLYPECTOMY  last 02/18/11   multiple, prior colon cancer and polyps, 7 mm adenoma 2012  . OVARIAN CYST SURGERY    . PARTIAL MASTECTOMY WITH NEEDLE LOCALIZATION Left 04/22/2016   Procedure: LEFT PARTIAL MASTECTOMY WITH DOUBLE NEEDLE LOCALIZATION REEXCISION INFERIOR AND LATERAL MARGIN ADJACENT TISSUE TRANSFER;  Surgeon: [name], MD;  Location: [facility];  Service: General;  Laterality: Left;  . TONSILLECTOMY      There were no vitals filed for this visit.  Subjective Assessment - 09/12/19 1111    Subjective  Patient reports that she has been doing okay reports that she feels the osteoporosis medication is causing her pain and left leg "jumping at night", reports that she has difficulty sleeping due to this                       [facility] Adult  PT Treatment/Exercise - 09/12/19 0001      Ambulation/Gait   Gait Comments  met her at car and walked with her and practiced negotiating curbs      High Level Balance   High Level Balance Comments  on airex marching, ball toss, head turns, eyes closed, then tried single leg standing      Knee/Hip Exercises: Stretches   Passive Hamstring Stretch  Both;5 reps;20 seconds    Quad Stretch  Both;4 reps;20 seconds    Piriformis Stretch  Both;4 reps;20 seconds    Other Knee/Hip Stretches  hip adductor stretch      Knee/Hip Exercises: Aerobic   Nustep  L4x6'      Knee/Hip Exercises: Standing   Hip Flexion  Both;2 sets;10 reps    Hip Flexion Limitations  2.5#    Hip Abduction  Both;2 sets;10 reps    Abduction Limitations  2.5#    Other Standing Knee Exercises  hip extension 2.5# 2x10      Knee/Hip Exercises: Sidelying   Hip ABduction  Both;2 sets;10 reps                  PT Long Term Goals - 09/12/19 1152      PT  LONG TERM GOAL #1   Title  I with HEP for balance, strengthening and posture ( 10/10/2019)    Status  Partially Met      PT LONG TERM GOAL #2   Title  increase lower body flexibility to WNL with patient reporting =/> 75% reduction of leg cramping at night ( 10/10/2019)    Status  Partially Met            Plan - 09/12/19 1150    Clinical Impression Statement  Patient reports that she feels a little better still worries about her legs, her sleep and the osteoporosis medication side effects, I worked a little more on strength of the hips and her LE flexibility, she is tight around the hips some pain with adductor stretch    PT Next Visit Plan  progress HEP       Patient will benefit from skilled therapeutic intervention in order to improve the following deficits and impairments:  Pain, Increased muscle spasms, Decreased strength, Postural dysfunction, Decreased knowledge of precautions  Visit Diagnosis: Other abnormalities of gait and mobility  Muscle  weakness (generalized)  Stiffness of right hip, not elsewhere classified  Abnormal posture     Problem List Patient Active Problem List   Diagnosis Date Noted  . Malignant neoplasm of upper-outer quadrant of left breast in female, estrogen receptor positive ([place]) 03/27/2016  . HEMORRHOIDS-EXTERNAL 11/08/2008  . HIATAL HERNIA 02/16/2007  . Osteoporosis 02/16/2007  . COLON CANCER, HX OF 02/16/2007    Sumner Boast., PT 09/12/2019, 11:53 AM  [place] [address] [place], [place], [address] Phone: [phone]   Fax:  [phone]  Name: Teresa Boyd MRN: [id number] Date of Birth: 19-Nov-1930

## 2019-10-06 ENCOUNTER — Other Ambulatory Visit: Payer: Self-pay | Admitting: *Deleted

## 2019-10-18 ENCOUNTER — Other Ambulatory Visit: Payer: Self-pay | Admitting: *Deleted

## 2019-10-18 MED ORDER — CALCITONIN (SALMON) 200 UNIT/ACT NA SOLN: 1.0000 | Freq: Every day | NASAL | 6 refills | Status: DC

## 2019-10-25 ENCOUNTER — Other Ambulatory Visit: Payer: Self-pay | Admitting: Oncology

## 2019-10-25 DIAGNOSIS — Z9889 Other specified postprocedural states: Secondary | ICD-10-CM

## 2019-10-31 ENCOUNTER — Ambulatory Visit: Payer: Medicare Other | Admitting: Nurse Practitioner

## 2019-12-05 ENCOUNTER — Other Ambulatory Visit: Payer: Self-pay

## 2019-12-05 ENCOUNTER — Ambulatory Visit
Admission: RE | Admit: 2019-12-05 | Discharge: 2019-12-05 | Disposition: A | Discharge status: Home / Self Care | Payer: Medicare Other | Source: Ambulatory Visit | Attending: Oncology | Admitting: Oncology

## 2019-12-05 DIAGNOSIS — Z9889 Other specified postprocedural states: Secondary | ICD-10-CM

## 2019-12-05 HISTORY — DX: Personal history of irradiation: Z92.3

## 2019-12-30 DIAGNOSIS — M545 Low back pain: Secondary | ICD-10-CM | POA: Diagnosis not present

## 2019-12-30 DIAGNOSIS — M25551 Pain in right hip: Secondary | ICD-10-CM | POA: Diagnosis not present

## 2020-01-01 ENCOUNTER — Other Ambulatory Visit: Payer: Self-pay | Admitting: *Deleted

## 2020-01-01 DIAGNOSIS — C50412 Malignant neoplasm of upper-outer quadrant of left female breast: Secondary | ICD-10-CM

## 2020-01-01 NOTE — Progress Notes (Signed)
[facility]  Telephone:([phone]) [phone] Fax:(336) [phone]     ID: Teresa Boyd DOB: May 16, 1931  MR#: [id number]  OVF#:[id number]  Patient Care Team: Patient, No Pcp Per as PCP - General (General Practice) Teresa Boyd as Consulting Physician (General Surgery) Teresa Mayer, Boyd as Consulting Physician (Gastroenterology) Teresa Boyd, Teresa Dad, Boyd as Consulting Physician (Oncology) Teresa Gibson, Boyd as Attending Physician (Radiation Oncology) Teresa Limbo, Boyd as Consulting Physician (Family Medicine) Teresa Farrier, Boyd as Consulting Physician (Obstetrics and Gynecology) Teresa Leitz, Boyd as Consulting Physician (Orthopedic Surgery) Teresa Boyd, Teresa Massed, NP as Nurse Practitioner (Hematology and Oncology) OTHER Boyd:   CHIEF COMPLAINT: Estrogen receptor positive invasive breast cancer  CURRENT TREATMENT: Anastrozole   INTERVAL HISTORY: Teresa Boyd returns today for follow-up of her estrogen receptor positive breast cancer.   She continues on anastrozole, which she tolerates well. She denies hot flashes or vaginal dryness problems  She also receives denosumab/Prolia.  Her most recent dose was 07/04/2019.  However she noted on the ads on TV that Prolia can cause bone pain and she certainly has bone pain.  She understands the pain is really due to knee problems and to back problems which are being evaluated by Teresa Boyd.  Nevertheless she would prefer to stop that medication.  Her most recent bone density testing on 06/08/2018 showed a T-score of -2.6, which is considered osteoporotic.  Since her last visit, she underwent bilateral diagnostic mammography with tomography at Teresa Boyd on 12/05/2019 showing: breast density category C; no evidence of malignancy in either breast.    REVIEW OF SYSTEMS: Teresa Boyd continues to garden, and do some work in Scientist, research (life sciences) estate.  Her main concern right now is the pain in her back and legs.  She says she can only sleep on her back.   She had some MRIs through Teresa Boyd but I do not have those reports which are in a different electronic medical record.  She has had both her Pfizer vaccine doses and did well with that.  She tells me her son will be visiting here soon.  A detailed review of systems today was otherwise stable.   BREAST CANCER HISTORY: From the original intake note:  Teresa Boyd herself palpated a mass in her left breast mid-June 2017. She brought it to the attention of Dr. Coletta Boyd who obtained screening mammography showing possible abnormalities in both breasts. Teresa Boyd was then referred to the [facility] where mammography and ultrasonography 03/18/2015 showed in the right breast an 8 mm spiculated mass at the 11:00 position 5 cm from the nipple and a second area of altered architecture also in the upper outer quadrant. In the left breast there was a 2.5 cm is spiculated mass in the upper outer quadrant 4 cm from the nipple. Ultrasound of both axillae were negative.  On 03/17/2016 the patient underwent biopsy of all 3 suspicious areas. In the right breast, both areas showed only fibrocystic change, with no evidence of malignancy (Teresa Boyd [id number]). This was read as concordant by radiology.  In the left breast however the final pathology (Teresa Boyd [id number]) showed an invasive ductal carcinoma, grade 2 or 3, estrogen receptor 5% positive, with moderate staining intensity, progesterone receptor negative, with an MIB-1 of 5%, and no HER-2 amplification, the signals ratio being 1.53 and the number per cell 3.45.  Teresa Boyd also has a remote history of colon cancer, in remission for more than 10 years with the most recent colonoscopy 2012 negative.   PAST MEDICAL HISTORY: Past  Medical History:  Diagnosis Date  . Breast cancer ([facility]) 2017   left breast  . Colon cancer ([facility]) 1994  . History of radiation therapy 06/16/16- 07/13/16   Left Breast  . Hx of adenomatous colonic polyps   . Personal history of radiation therapy    . Varicose vein     PAST SURGICAL HISTORY: Past Surgical History:  Procedure Laterality Date  . ABDOMINAL HYSTERECTOMY    . BREAST BIOPSY Right   . BREAST BIOPSY Right   . BREAST LUMPECTOMY Left 2017  . COLON RESECTION     sigmoid  . COLON SURGERY  1994   colon cancer  . COLONOSCOPY W/ POLYPECTOMY  last 02/18/11   multiple, prior colon cancer and polyps, 7 mm adenoma 2012  . OVARIAN CYST SURGERY    . PARTIAL MASTECTOMY WITH NEEDLE LOCALIZATION Left 04/22/2016   Procedure: LEFT PARTIAL MASTECTOMY WITH DOUBLE NEEDLE LOCALIZATION REEXCISION INFERIOR AND LATERAL MARGIN ADJACENT TISSUE TRANSFER;  Surgeon: Teresa Boyd;  Location: [facility];  Service: General;  Laterality: Left;  . TONSILLECTOMY      FAMILY HISTORY Family History  Problem Relation Age of Onset  . Colon cancer Mother 87  . Colon polyps Father 25  . Pancreatic cancer Neg Hx   . Rectal cancer Neg Hx   . Stomach cancer Neg Hx   The patient's father died at age 68 from postoperative complications in the setting of peptic ulcer disease. The patient's mother died at age 42 with colon cancer. The patient had 5 brothers, 2 sisters. One brother died in an automobile accident. One brother has colitis. A maternal aunt developed cancer in her "female organs" in her 4s. The patient has no further information regarding this. She is not aware of any other cancers in the family aside of course from her own remote colon cancer.   GYNECOLOGIC HISTORY:  No LMP recorded. Patient has had a hysterectomy. Menarche age 8, first live birth age 50. The patient is Teresa Boyd P1 area in 1955 she underwent total abdominal hysterectomy with bilateral salpingo-oophorectomy. She tried hormone replacement briefly (less than a year). She never took oral contraceptives.   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 She is widowed and lives by herself with no pets. Her son Teresa Boyd is a pulmonologist in [place],  [place]. The patient has 4 grandchildren, and no great grandchildren. She attends the [employer] locally. 3 of her siblings live in town including her sister Joycelyn Schmid, who is a retired Rec[employer] consultant    ADVANCED DIRECTIVES:At the 03/27/2016 visit the patient was given the appropriate forms to complete and notarize at her discretion. She intends to name her son Antony Haste as healthcare power of attorney. He can be reached at [phone]   HEALTH MAINTENANCE: Social History   Tobacco Use  . Smoking status: Never Smoker  . Smokeless tobacco: Never Used  Substance Use Topics  . Alcohol use: No  . Drug use: No    Colonoscopy:02/18/2011, [facility]; tubular adenoma with no high-grade dysplasia  Bone density: 06/08/2018, -2.6   Allergies  Allergen Reactions  . Sotradecol [Sodium Tetradecyl Sulfate] Anaphylaxis  . Celebrex [Celecoxib]   . Clindamycin   . Conjugated Estrogens   . Estradiol Benzoate Other (See Comments)    Caused fast heart rate  . Estrogens Conjugated   . Hypaque-M [Diatrizone Sodium-Diatrizone Meglumine] Hives    Ct scan IVP dye  . Penicillins Hives  . Tramadol Nausea Only  Current Outpatient Medications  Medication Sig Dispense Refill  . anastrozole (ARIMIDEX) 1 MG tablet TAKE 1 TABLET DAILY 90 tablet 3  . calcitonin, salmon, (MIACALCIN/FORTICAL) 200 UNIT/ACT nasal spray Place 1 spray into alternate nostrils daily. 3.7 mL 6  . Calcium Carbonate (CALCIUM-CARB 600 PO) Take by mouth.    . cetirizine (ZYRTEC) 10 MG tablet Take 10 mg by mouth daily.    . Cholecalciferol (VITAMIN D3) 1000 units CAPS Take by mouth.    . gabapentin (NEURONTIN) 100 MG capsule Take 2 capsules (200 mg total) by mouth at bedtime. 180 capsule 1  . Grape Seed Extract 100 MG CAPS Take 400 each by mouth.    . Multiple Vitamin (MULTIVITAMIN) tablet Take 1 tablet by mouth daily.       No current facility-administered medications for this visit.    OBJECTIVE: Older white woman in no acute  distress  Vitals:   01/02/20 1352  BP: (!) 154/74  Pulse: 71  Resp: 18  Temp: 98.5 F (36.9 C)  SpO2: 99%     Body mass index is 24.78 kg/m.    ECOG FS:1 - Symptomatic but completely ambulatory  Sclerae unicteric, EOMs intact Wearing a mask No cervical or supraclavicular adenopathy Lungs no rales or rhonchi Heart regular rate and rhythm Abd soft, nontender, positive bowel sounds MSK mild kyphosis but no focal spinal tenderness, no upper extremity lymphedema Neuro: nonfocal, well oriented, appropriate affect Breasts: The right breast is unremarkable.  The left breast is status post lumpectomy and radiation, with no evidence of local recurrence.  Both axillae are benign.   LAB RESULTS:  CMP     Component Value Date/Time   NA 141 01/02/2020 1334   NA 139 09/07/2017 1432   K 4.0 01/02/2020 1334   K 4.3 09/07/2017 1432   CL 105 01/02/2020 1334   CO2 27 01/02/2020 1334   CO2 26 09/07/2017 1432   GLUCOSE 112 (H) 01/02/2020 1334   GLUCOSE 100 09/07/2017 1432   BUN 13 01/02/2020 1334   BUN 14.4 09/07/2017 1432   CREATININE 0.71 01/02/2020 1334   CREATININE 0.7 09/07/2017 1432   CALCIUM 9.1 01/02/2020 1334   CALCIUM 9.2 09/07/2017 1432   PROT 6.8 01/02/2020 1334   PROT 7.2 09/07/2017 1432   ALBUMIN 3.7 01/02/2020 1334   ALBUMIN 3.8 09/07/2017 1432   AST 19 01/02/2020 1334   AST 18 09/07/2017 1432   ALT 16 01/02/2020 1334   ALT 13 09/07/2017 1432   ALKPHOS 53 01/02/2020 1334   ALKPHOS 54 09/07/2017 1432   BILITOT 0.7 01/02/2020 1334   BILITOT 0.52 09/07/2017 1432   GFRNONAA >60 01/02/2020 1334   GFRAA >60 01/02/2020 1334    INo results found for: SPEP, UPEP  Lab Results  Component Value Date   WBC 3.7 (L) 01/02/2020   NEUTROABS 2.1 01/02/2020   HGB 13.1 01/02/2020   HCT 41.1 01/02/2020   MCV 98.8 01/02/2020   PLT 190 01/02/2020      Chemistry      Component Value Date/Time   NA 141 01/02/2020 1334   NA 139 09/07/2017 1432   K 4.0 01/02/2020 1334   K  4.3 09/07/2017 1432   CL 105 01/02/2020 1334   CO2 27 01/02/2020 1334   CO2 26 09/07/2017 1432   BUN 13 01/02/2020 1334   BUN 14.4 09/07/2017 1432   CREATININE 0.71 01/02/2020 1334   CREATININE 0.7 09/07/2017 1432      Component Value Date/Time   CALCIUM 9.1  01/02/2020 1334   CALCIUM 9.2 09/07/2017 1432   ALKPHOS 53 01/02/2020 1334   ALKPHOS 54 09/07/2017 1432   AST 19 01/02/2020 1334   AST 18 09/07/2017 1432   ALT 16 01/02/2020 1334   ALT 13 09/07/2017 1432   BILITOT 0.7 01/02/2020 1334   BILITOT 0.52 09/07/2017 1432      No results found for: LABCA2  No components found for: LABCA125  No results for input(s): INR in the last 168 hours.  Urinalysis No results found for: COLORURINE, APPEARANCEUR, LABSPEC, [place], GLUCOSEU, HGBUR, BILIRUBINUR, KETONESUR, PROTEINUR, UROBILINOGEN, NITRITE, LEUKOCYTESUR   STUDIES: MM DIAG BREAST TOMO BILATERAL  Result Date: 12/05/2019 CLINICAL DATA:  LEFT lumpectomy with radiation therapy 2017. EXAM: DIGITAL DIAGNOSTIC BILATERAL MAMMOGRAM WITH CAD AND TOMO COMPARISON:  11/30/2018 and ACR Breast Density Category c: The breast tissue is heterogeneously dense, which may obscure small masses. FINDINGS: Post operative changes are seen in the LEFTbreast. No suspicious mass, distortion, or microcalcifications are identified to suggest presence of malignancy. Mammographic images were processed with CAD. IMPRESSION: No mammographic evidence for malignancy. RECOMMENDATION: Diagnostic mammogram is suggested in 1 year. (Code:DM-B-01Y) I have discussed the findings and recommendations with the patient. If applicable, a reminder letter will be sent to the patient regarding the next appointment. BI-RADS CATEGORY  2: Benign. Electronically Signed   By: Nolon Nations M.D.   On: 12/05/2019 14:46     ELIGIBLE FOR AVAILABLE RESEARCH PROTOCOL: [facility]  ASSESSMENT: 86 y.o. [place] woman status post left breast upper outer quadrant biopsy 03/17/2016 for a  clinical T2 N0, stage IIa invasive ductal carcinoma, grade 2 or 3, minimally estrogen receptor positive at 5% with moderate staining intensity, progesterone receptor and HER-2 negative, with an MIB-1 of 5%  (1) Status post left Lumpectomy without sentinel lymph node sampling 04/22/2016 for apT2 pNX, stage II   invasive ductal carcinoma, grade 3, with negative margins  (a) repeat prognostic panel pending  (2) Mammaprint results "high risk" suggests a risk of recurrence with no systemic treatment of 29%   (a) patient offered adjuvant CMF chemotherapy but opted against it   (3) adjuvant radiation 06/16/16-07/13/16 Site/dose:   1) Left breast / 40.05 Gy in 15 fx                         2) Boost / 10 Gy in 10 Fx  (4) started anastrozole 07/22/2016 -- to be continued through OCT 2022  (a) DEXA scan 06/02/2016 shows osteoporosis, with a T score of -2.7  (b) denosumab /Prolia started 10/20/2016, last dose OCT 2020  (c) repeat bone density 06/08/2018 showed a T score of -2.6  (d) repeat bone density MAR 2022   PLAN: Jaionna is now close to 4 years out from definitive surgery for her breast cancer with no evidence of disease recurrence.  This is favorable.  She is tolerating anastrozole well and the plan will be to continue that a total of 5 years.  She received Prolia until last October and hopefully we will see some improvement in her DEXA scan when that is repeated with her next mammogram March 2022.  As discussed above she prefers not to receive any further doses at this point  On the way out she asked me if I knew someone who could help her with a problem with the computer.  She participated in some questionnaire and she "won" and they were wanting her credit card number.  I told her there are a  lot of scams around and that she really needs to wait until her son comes here so he can help her with that particular issue.    Also I gave her a copy of her assessment above so her son has some record  of what is going on with her from a breast cancer point of view.  She will see me March 2022 after her mammography and DEXA scan.  That may be her "graduation visit" as she will be stopping the anastrozole October of that year  Total encounter time 30 minutes.Sarajane Jews C. Love Milbourne, Boyd  01/02/20 2:22 PM Medical Oncology and Hematology [facility] [place], [place] [address] Tel. [phone]    Fax. [phone]   I, Wilburn Mylar, am acting as scribe for Dr. Virgie Boyd. Leylah Tarnow.  I, Lurline Del Boyd, have reviewed the above documentation for accuracy and completeness, and I agree with the above.    *Total Encounter Time as defined by the Centers for Medicare and Medicaid Services includes, in addition to the face-to-face time of a patient visit (documented in the note above) non-face-to-face time: obtaining and reviewing outside history, ordering and reviewing medications, tests or procedures, care coordination (communications with other health care professionals or caregivers) and documentation in the medical record.

## 2020-01-02 ENCOUNTER — Inpatient Hospital Stay: Payer: Medicare Other

## 2020-01-02 ENCOUNTER — Other Ambulatory Visit: Payer: Self-pay

## 2020-01-02 ENCOUNTER — Inpatient Hospital Stay: Payer: Medicare Other | Attending: Oncology | Admitting: Oncology

## 2020-01-02 VITALS — BP 154/74 | HR 71 | Temp 98.5°F | Resp 18 | Ht 63.0 in | Wt 139.9 lb

## 2020-01-02 DIAGNOSIS — C50412 Malignant neoplasm of upper-outer quadrant of left female breast: Secondary | ICD-10-CM | POA: Diagnosis not present

## 2020-01-02 DIAGNOSIS — M818 Other osteoporosis without current pathological fracture: Secondary | ICD-10-CM

## 2020-01-02 DIAGNOSIS — Z170 Estrogen receptor positive status [ER+]: Secondary | ICD-10-CM | POA: Diagnosis not present

## 2020-01-02 DIAGNOSIS — Z79811 Long term (current) use of aromatase inhibitors: Secondary | ICD-10-CM | POA: Diagnosis not present

## 2020-01-02 DIAGNOSIS — Z923 Personal history of irradiation: Secondary | ICD-10-CM | POA: Diagnosis not present

## 2020-01-02 DIAGNOSIS — Z85038 Personal history of other malignant neoplasm of large intestine: Secondary | ICD-10-CM | POA: Diagnosis not present

## 2020-01-02 DIAGNOSIS — Z86010 Personal history of colonic polyps: Secondary | ICD-10-CM | POA: Insufficient documentation

## 2020-01-02 LAB — CBC WITH DIFFERENTIAL (CANCER CENTER ONLY)
Abs Immature Granulocytes: 0.01 10*3/uL (ref 0.00–0.07)
Basophils Absolute: 0 10*3/uL (ref 0.0–0.1)
Basophils Relative: 1 %
Eosinophils Absolute: 0.1 10*3/uL (ref 0.0–0.5)
Eosinophils Relative: 4 %
HCT: 41.1 % (ref 36.0–46.0)
Hemoglobin: 13.1 g/dL (ref 12.0–15.0)
Immature Granulocytes: 0 %
Lymphocytes Relative: 28 %
Lymphs Abs: 1.1 10*3/uL (ref 0.7–4.0)
MCH: 31.5 pg (ref 26.0–34.0)
MCHC: 31.9 g/dL (ref 30.0–36.0)
MCV: 98.8 fL (ref 80.0–100.0)
Monocytes Absolute: 0.3 10*3/uL (ref 0.1–1.0)
Monocytes Relative: 9 %
Neutro Abs: 2.1 10*3/uL (ref 1.7–7.7)
Neutrophils Relative %: 58 %
Platelet Count: 190 10*3/uL (ref 150–400)
RBC: 4.16 MIL/uL (ref 3.87–5.11)
RDW: 13.6 % (ref 11.5–15.5)
WBC Count: 3.7 10*3/uL — ABNORMAL LOW (ref 4.0–10.5)
nRBC: 0 % (ref 0.0–0.2)

## 2020-01-02 LAB — CMP (CANCER CENTER ONLY)
ALT: 16 U/L (ref 0–44)
AST: 19 U/L (ref 15–41)
Albumin: 3.7 g/dL (ref 3.5–5.0)
Alkaline Phosphatase: 53 U/L (ref 38–126)
Anion gap: 9 (ref 5–15)
BUN: 13 mg/dL (ref 8–23)
CO2: 27 mmol/L (ref 22–32)
Calcium: 9.1 mg/dL (ref 8.9–10.3)
Chloride: 105 mmol/L (ref 98–111)
Creatinine: 0.71 mg/dL (ref 0.44–1.00)
GFR, Est AFR Am: >60 mL/min (ref >60)
GFR, Estimated: >60 mL/min (ref >60)
Glucose, Bld: 112 mg/dL — ABNORMAL HIGH (ref 70–99)
Potassium: 4 mmol/L (ref 3.5–5.1)
Sodium: 141 mmol/L (ref 135–145)
Total Bilirubin: 0.7 mg/dL (ref 0.3–1.2)
Total Protein: 6.8 g/dL (ref 6.5–8.1)

## 2020-01-03 ENCOUNTER — Ambulatory Visit: Payer: Medicare Other | Admitting: Nurse Practitioner

## 2020-01-04 DIAGNOSIS — M1611 Unilateral primary osteoarthritis, right hip: Secondary | ICD-10-CM | POA: Diagnosis not present

## 2020-01-04 DIAGNOSIS — M5441 Lumbago with sciatica, right side: Secondary | ICD-10-CM | POA: Diagnosis not present

## 2020-01-04 DIAGNOSIS — M25551 Pain in right hip: Secondary | ICD-10-CM | POA: Diagnosis not present

## 2020-01-04 DIAGNOSIS — M545 Low back pain: Secondary | ICD-10-CM | POA: Diagnosis not present

## 2020-01-12 DIAGNOSIS — M5416 Radiculopathy, lumbar region: Secondary | ICD-10-CM | POA: Diagnosis not present

## 2020-01-16 DIAGNOSIS — M5416 Radiculopathy, lumbar region: Secondary | ICD-10-CM | POA: Diagnosis not present

## 2020-01-31 ENCOUNTER — Other Ambulatory Visit: Payer: Self-pay

## 2020-02-01 ENCOUNTER — Encounter: Payer: Self-pay | Admitting: Nurse Practitioner

## 2020-02-01 ENCOUNTER — Ambulatory Visit (INDEPENDENT_AMBULATORY_CARE_PROVIDER_SITE_OTHER): Payer: Medicare Other | Admitting: Nurse Practitioner

## 2020-02-01 VITALS — BP 120/78 | HR 72 | Temp 97.1°F | Ht 63.0 in | Wt 136.6 lb

## 2020-02-01 DIAGNOSIS — M818 Other osteoporosis without current pathological fracture: Secondary | ICD-10-CM

## 2020-02-01 DIAGNOSIS — Z13220 Encounter for screening for lipoid disorders: Secondary | ICD-10-CM

## 2020-02-01 DIAGNOSIS — R739 Hyperglycemia, unspecified: Secondary | ICD-10-CM

## 2020-02-01 DIAGNOSIS — Z136 Encounter for screening for cardiovascular disorders: Secondary | ICD-10-CM | POA: Diagnosis not present

## 2020-02-01 DIAGNOSIS — I10 Essential (primary) hypertension: Secondary | ICD-10-CM | POA: Diagnosis not present

## 2020-02-01 DIAGNOSIS — Z23 Encounter for immunization: Secondary | ICD-10-CM

## 2020-02-01 MED ORDER — ZOSTER VAC RECOMB ADJUVANTED 50 MCG/0.5ML IM SUSR: 0.5000 mL | Freq: Once | INTRAMUSCULAR | 0 refills | Status: AC

## 2020-02-01 NOTE — Progress Notes (Signed)
Subjective:  Patient ID: Teresa Boyd, female    DOB: 06/20/1931  Age: 86 y.o. MRN: [id number]  CC: Establish Care (New Patient-CPE-pt isn't fasting//fully vaccinated last shot 11/01/19)  HPI Unaccompanied Teresa Boyd wants establish care and to discuss need for shingles vaccine.She reports Shingles outbreak during chemotherapy. She had no previous vaccine.  HTN: Normal BP today BP Readings from Last 3 Encounters:  02/01/20 120/78  01/02/20 (!) 154/74  07/04/19 (!) 145/67   Osteoporosis: Managed by Dr. Jana Hakim (oncology) Current use of arimidex  Due to hx of breast cancer Last dexa scan 05/2018, T score of -2.6 "Last prolia dose 07/04/2019. Injections discontinued by patient due to concern about side effect: bone pain."  She reports hx of restless leg syndrome. Worse at night, current use of mirapex. She states this was prescribed by her son who is a pulmonologist in [place].  Reviewed past Medical, Social and Family history today.  Outpatient Medications Prior to Visit  Medication Sig Dispense Refill  . anastrozole (ARIMIDEX) 1 MG tablet TAKE 1 TABLET DAILY 90 tablet 3  . calcitonin, salmon, (MIACALCIN/FORTICAL) 200 UNIT/ACT nasal spray Place 1 spray into alternate nostrils daily. 3.7 mL 6  . Calcium Carbonate (CALCIUM-CARB 600 PO) Take by mouth.    . cetirizine (ZYRTEC) 10 MG tablet Take 10 mg by mouth daily.    . Cholecalciferol (VITAMIN D3) 1000 units CAPS Take by mouth.    . Grape Seed Extract 100 MG CAPS Take 400 each by mouth.    . Multiple Vitamin (MULTIVITAMIN) tablet Take 1 tablet by mouth daily.      . pramipexole (MIRAPEX) 0.25 MG tablet Take 0.25 mg by mouth at bedtime.    . gabapentin (NEURONTIN) 100 MG capsule Take 2 capsules (200 mg total) by mouth at bedtime. (Patient not taking: Reported on 02/01/2020) 180 capsule 1   No facility-administered medications prior to visit.    ROS See HPI  Objective:  BP 120/78   Pulse 72   Temp (!) 97.1 F (36.2 C)  (Tympanic)   Ht 5\' 3"  (1.6 m)   Wt 136 lb 9.6 oz (62 kg)   SpO2 98%   BMI 24.20 kg/m   BP Readings from Last 3 Encounters:  02/01/20 120/78  01/02/20 (!) 154/74  07/04/19 (!) 145/67    Wt Readings from Last 3 Encounters:  02/01/20 136 lb 9.6 oz (62 kg)  01/02/20 139 lb 14.4 oz (63.5 kg)  07/04/19 144 lb 9.6 oz (65.6 kg)    Physical Exam Vitals reviewed.  Eyes:     Extraocular Movements: Extraocular movements intact.     Conjunctiva/sclera: Conjunctivae normal.  Cardiovascular:     Rate and Rhythm: Normal rate and regular rhythm.     Pulses: Normal pulses.     Heart sounds: Gallop present.   Pulmonary:     Effort: Pulmonary effort is normal.     Breath sounds: Normal breath sounds.  Abdominal:     General: Bowel sounds are normal.     Palpations: Abdomen is soft.  Musculoskeletal:     Right lower leg: No edema.     Left lower leg: No edema.     Comments: Arthralgic gait  Neurological:     Mental Status: She is alert and oriented to person, place, and time.  Psychiatric:        Mood and Affect: Mood normal.        Behavior: Behavior normal.        Thought Content: Thought  content normal.    Lab Results  Component Value Date   WBC 3.7 (L) 01/02/2020   HGB 13.1 01/02/2020   HCT 41.1 01/02/2020   PLT 190 01/02/2020   GLUCOSE 112 (H) 01/02/2020   ALT 16 01/02/2020   AST 19 01/02/2020   NA 141 01/02/2020   K 4.0 01/02/2020   CL 105 01/02/2020   CREATININE 0.71 01/02/2020   BUN 13 01/02/2020   CO2 27 01/02/2020    Assessment & Plan:  This visit occurred during the SARS-CoV-2 public health emergency.  Safety protocols were in place, including screening questions prior to the visit, additional usage of staff PPE, and extensive cleaning of exam room while observing appropriate contact time as indicated for disinfecting solutions.   Teresa Boyd was seen today for establish care.  Diagnoses and all orders for this visit:  Essential hypertension -     TSH; Future   Hyperglycemia -     Hemoglobin A1c; Future  Encounter for lipid screening for cardiovascular disease -     Lipid panel; Future  Vaccine for VZV (varicella-zoster virus) -     Zoster Vaccine Adjuvanted [facility]) injection; Inject 0.5 mLs into the muscle once for 1 dose.   I am having Teresa Boyd start on Zoster Vaccine Adjuvanted. I am also having her maintain her multivitamin, cetirizine, Calcium Carbonate (CALCIUM-CARB 600 PO), Vitamin D3, Grape Seed Extract, gabapentin, anastrozole, calcitonin (salmon), and pramipexole.  Meds ordered this encounter  Medications  . Zoster Vaccine Adjuvanted [facility]) injection    Sig: Inject 0.5 mLs into the muscle once for 1 dose.    Dispense:  0.5 mL    Refill:  0    Order Specific Question:   Supervising Provider    Answer:   Ronnald Nian [id number]    Problem List Items Addressed This Visit    None    Visit Diagnoses    Essential hypertension    -  Primary   Relevant Orders   TSH   Hyperglycemia       Relevant Orders   Hemoglobin A1c   Encounter for lipid screening for cardiovascular disease       Relevant Orders   Lipid panel   Vaccine for VZV (varicella-zoster virus)       Relevant Medications   Zoster Vaccine Adjuvanted [facility]) injection      Follow-up: Return if symptoms worsen or fail to improve.  Wilfred Lacy, NP

## 2020-02-01 NOTE — Patient Instructions (Addendum)
Schedule lab appt. You need to be fasting 4-6hrs prior to blood draw (ok to drink water)  You will need to get Shingrix vaccine at your retail pharmacy. I sent a prescription.  Monitor your BP at home at least 2x/week. Call office if >140/80.   Fall Prevention in the Home, Adult Falls can cause injuries. They can happen to people of all ages. There are many things you can do to make your home safe and to help prevent falls. Ask for help when making these changes, if needed. What actions can I take to prevent falls? General Instructions  Use good lighting in all rooms. Replace any light bulbs that burn out.  Turn on the lights when you go into a dark area. Use night-lights.  Keep items that you use often in easy-to-reach places. Lower the shelves around your home if necessary.  Set up your furniture so you have a clear path. Avoid moving your furniture around.  Do not have throw rugs and other things on the floor that can make you trip.  Avoid walking on wet floors.  If any of your floors are uneven, fix them.  Add color or contrast paint or tape to clearly mark and help you see: ? Any grab bars or handrails. ? First and last steps of stairways. ? Where the edge of each step is.  If you use a stepladder: ? Make sure that it is fully opened. Do not climb a closed stepladder. ? Make sure that both sides of the stepladder are locked into place. ? Ask someone to hold the stepladder for you while you use it.  If there are any pets around you, be aware of where they are. What can I do in the bathroom?      Keep the floor dry. Clean up any water that spills onto the floor as soon as it happens.  Remove soap buildup in the tub or shower regularly.  Use non-skid mats or decals on the floor of the tub or shower.  Attach bath mats securely with double-sided, non-slip rug tape.  If you need to sit down in the shower, use a plastic, non-slip stool.  Install grab bars by the  toilet and in the tub and shower. Do not use towel bars as grab bars. What can I do in the bedroom?  Make sure that you have a light by your bed that is easy to reach.  Do not use any sheets or blankets that are too big for your bed. They should not hang down onto the floor.  Have a firm chair that has side arms. You can use this for support while you get dressed. What can I do in the kitchen?  Clean up any spills right away.  If you need to reach something above you, use a strong step stool that has a grab bar.  Keep electrical cords out of the way.  Do not use floor polish or wax that makes floors slippery. If you must use wax, use non-skid floor wax. What can I do with my stairs?  Do not leave any items on the stairs.  Make sure that you have a light switch at the top of the stairs and the bottom of the stairs. If you do not have them, ask someone to add them for you.  Make sure that there are handrails on both sides of the stairs, and use them. Fix handrails that are broken or loose. Make sure that handrails are as  long as the stairways.  Install non-slip stair treads on all stairs in your home.  Avoid having throw rugs at the top or bottom of the stairs. If you do have throw rugs, attach them to the floor with carpet tape.  Choose a carpet that does not hide the edge of the steps on the stairway.  Check any carpeting to make sure that it is firmly attached to the stairs. Fix any carpet that is loose or worn. What can I do on the outside of my home?  Use bright outdoor lighting.  Regularly fix the edges of walkways and driveways and fix any cracks.  Remove anything that might make you trip as you walk through a door, such as a raised step or threshold.  Trim any bushes or trees on the path to your home.  Regularly check to see if handrails are loose or broken. Make sure that both sides of any steps have handrails.  Install guardrails along the edges of any raised decks  and porches.  Clear walking paths of anything that might make someone trip, such as tools or rocks.  Have any leaves, snow, or ice cleared regularly.  Use sand or salt on walking paths during winter.  Clean up any spills in your garage right away. This includes grease or oil spills. What other actions can I take?  Wear shoes that: ? Have a low heel. Do not wear high heels. ? Have rubber bottoms. ? Are comfortable and fit you well. ? Are closed at the toe. Do not wear open-toe sandals.  Use tools that help you move around (mobility aids) if they are needed. These include: ? Canes. ? Walkers. ? Scooters. ? Crutches.  Review your medicines with your doctor. Some medicines can make you feel dizzy. This can increase your chance of falling. Ask your doctor what other things you can do to help prevent falls. Where to find more information  Centers for Disease Control and Prevention, STEADI: https://garcia.biz/  [employer] on Aging: BrainJudge.co.uk Contact a doctor if:  You are afraid of falling at home.  You feel weak, drowsy, or dizzy at home.  You fall at home. Summary  There are many simple things that you can do to make your home safe and to help prevent falls.  Ways to make your home safe include removing tripping hazards and installing grab bars in the bathroom.  Ask for help when making these changes in your home. This information is not intended to replace advice given to you by your health care provider. Make sure you discuss any questions you have with your health care provider. Document Revised: 12/29/2018 Document Reviewed: 04/22/2017 Elsevier Patient Education  2020 [employer].

## 2020-02-01 NOTE — Assessment & Plan Note (Signed)
Managed by Dr. Jana Hakim (oncology) Current use of arimidex  Due to hx of breast cancer Last dexa scan 05/2018, T score of -2.6 Last prolia dose 07/04/2019. Injections discontinued by patient due to concern about side effect: bone pain

## 2020-02-06 DIAGNOSIS — M5416 Radiculopathy, lumbar region: Secondary | ICD-10-CM | POA: Diagnosis not present

## 2020-02-20 ENCOUNTER — Other Ambulatory Visit: Payer: Self-pay

## 2020-02-21 ENCOUNTER — Other Ambulatory Visit (INDEPENDENT_AMBULATORY_CARE_PROVIDER_SITE_OTHER): Payer: Medicare Other

## 2020-02-21 DIAGNOSIS — Z13220 Encounter for screening for lipoid disorders: Secondary | ICD-10-CM

## 2020-02-21 DIAGNOSIS — Z136 Encounter for screening for cardiovascular disorders: Secondary | ICD-10-CM

## 2020-02-21 DIAGNOSIS — R739 Hyperglycemia, unspecified: Secondary | ICD-10-CM

## 2020-02-21 DIAGNOSIS — I10 Essential (primary) hypertension: Secondary | ICD-10-CM

## 2020-02-21 LAB — LIPID PANEL
Cholesterol: 164 mg/dL (ref 0–200)
HDL: 50.7 mg/dL (ref >39.00)
LDL Cholesterol: 98 mg/dL (ref 0–99)
NonHDL: 113.64
Total CHOL/HDL Ratio: 3
Triglycerides: 77 mg/dL (ref 0.0–149.0)
VLDL: 15.4 mg/dL (ref 0.0–40.0)

## 2020-02-21 LAB — HEMOGLOBIN A1C: Hgb A1c MFr Bld: 6 % (ref 4.6–6.5)

## 2020-02-21 LAB — TSH: TSH: 1.92 u[IU]/mL (ref 0.35–4.50)

## 2020-04-08 ENCOUNTER — Telehealth: Payer: Self-pay | Admitting: Internal Medicine

## 2020-04-08 NOTE — Telephone Encounter (Signed)
Spoke with patient regarding her symptoms pt states that she has been having stools that are "Narrow as a pencil couple of weeks ago, inch and a half wide recently" Pt denies any abdominal pain, bloating, nausea or vomiting. Pt states that she has tried dulcolax. Pt advised to try 1-3 doses of Miralax daily until a normal BM, pt advised that she can decrease dosage once she has a normal BM. Pt wanted to know if we can do any imaging to see if she is constipated pt advised that would be up to Dr. Carlean Purl, pt aware that Dr. Carlean Purl is out of the office today but we will send him this information and get back with her as soon as we hear something. Please advise, thank you.

## 2020-04-08 NOTE — Telephone Encounter (Signed)
Patient called states she feels like she has a blockage its been over a week now and she is highly concerned please advise

## 2020-04-09 NOTE — Telephone Encounter (Signed)
Agree w/ RN advice re: MiraLax  I do not think any imaging necessary at this time  If problems do not resolve then we would consider testing

## 2020-04-09 NOTE — Telephone Encounter (Signed)
Spoke with patient regarding recommendations, pt advised to try Miralax for 1 week and then give [place] a call with an update, pt states that she will give [place] a call on Monday with an update. Pt just wants to make sure that she is not having a blockage.

## 2020-06-19 ENCOUNTER — Other Ambulatory Visit: Payer: Self-pay | Admitting: Oncology

## 2020-07-29 DIAGNOSIS — H903 Sensorineural hearing loss, bilateral: Secondary | ICD-10-CM | POA: Diagnosis not present

## 2020-07-30 ENCOUNTER — Telehealth: Payer: Self-pay | Admitting: Nurse Practitioner

## 2020-08-01 ENCOUNTER — Ambulatory Visit (INDEPENDENT_AMBULATORY_CARE_PROVIDER_SITE_OTHER): Payer: Medicare Other

## 2020-08-01 ENCOUNTER — Other Ambulatory Visit: Payer: Self-pay | Admitting: Otolaryngology

## 2020-08-01 VITALS — Ht 63.0 in | Wt 136.0 lb

## 2020-08-01 DIAGNOSIS — Z0000 Encounter for general adult medical examination without abnormal findings: Secondary | ICD-10-CM | POA: Diagnosis not present

## 2020-08-01 DIAGNOSIS — H918X9 Other specified hearing loss, unspecified ear: Secondary | ICD-10-CM

## 2020-08-01 NOTE — Patient Instructions (Signed)
Teresa Boyd , Thank you for taking time to complete your Medicare Wellness Visit. I appreciate your ongoing commitment to your health goals. Please review the following plan we discussed and let me know if I can assist you in the future.   Screening recommendations/referrals: Colonoscopy: No longer required Mammogram: Completed 12/05/2019-Due 12/04/2020 Bone Density: Due- Per our conversation,to be ordered by Oncologist Recommended yearly ophthalmology/optometry visit for glaucoma screening and checkup Recommended yearly dental visit for hygiene and checkup  Vaccinations: Influenza vaccine: Declined Pneumococcal vaccine: Completed vaccines Tdap vaccine: Up to Date- Due-03/31/2021 Shingles vaccine: Discuss with pharmacy Covid-19:Completed vaccines  Advanced directives: Please bring a copy for your chart once completed  Conditions/risks identified: See problem list  Next appointment: Follow up in one year for your annual wellness visit    Preventive Care 65 Years and Older, Female Preventive care refers to lifestyle choices and visits with your health care provider that can promote health and wellness. What does preventive care include?  A yearly physical exam. This is also called an annual well check.  Dental exams once or twice a year.  Routine eye exams. Ask your health care provider how often you should have your eyes checked.  Personal lifestyle choices, including:  Daily care of your teeth and gums.  Regular physical activity.  Eating a healthy diet.  Avoiding tobacco and drug use.  Limiting alcohol use.  Practicing safe sex.  Taking low-dose aspirin every day.  Taking vitamin and mineral supplements as recommended by your health care provider. What happens during an annual well check? The services and screenings done by your health care provider during your annual well check will depend on your age, overall health, lifestyle risk factors, and family history of  disease. Counseling  Your health care provider may ask you questions about your:  Alcohol use.  Tobacco use.  Drug use.  Emotional well-being.  Home and relationship well-being.  Sexual activity.  Eating habits.  History of falls.  Memory and ability to understand (cognition).  Work and work Statistician.  Reproductive health. Screening  You may have the following tests or measurements:  Height, weight, and BMI.  Blood pressure.  Lipid and cholesterol levels. These may be checked every 5 years, or more frequently if you are over 66 years old.  Skin check.  Lung cancer screening. You may have this screening every year starting at age 84 if you have a 30-pack-year history of smoking and currently smoke or have quit within the past 15 years.  Fecal occult blood test (FOBT) of the stool. You may have this test every year starting at age 84.  Flexible sigmoidoscopy or colonoscopy. You may have a sigmoidoscopy every 5 years or a colonoscopy every 10 years starting at age 84.  Hepatitis C blood test.  Hepatitis B blood test.  Sexually transmitted disease (STD) testing.  Diabetes screening. This is done by checking your blood sugar (glucose) after you have not eaten for a while (fasting). You may have this done every 1-3 years.  Bone density scan. This is done to screen for osteoporosis. You may have this done starting at age 64.  Mammogram. This may be done every 1-2 years. Talk to your health care provider about how often you should have regular mammograms. Talk with your health care provider about your test results, treatment options, and if necessary, the need for more tests. Vaccines  Your health care provider may recommend certain vaccines, such as:  Influenza vaccine. This is recommended every  year.  Tetanus, diphtheria, and acellular pertussis (Tdap, Td) vaccine. You may need a Td booster every 10 years.  Zoster vaccine. You may need this after age  84.  Pneumococcal 13-valent conjugate (PCV13) vaccine. One dose is recommended after age 84.  Pneumococcal polysaccharide (PPSV23) vaccine. One dose is recommended after age 84. Talk to your health care provider about which screenings and vaccines you need and how often you need them. This information is not intended to replace advice given to you by your health care provider. Make sure you discuss any questions you have with your health care provider. Document Released: 10/04/2015 Document Revised: 05/27/2016 Document Reviewed: 07/09/2015 Elsevier Interactive Patient Education  2017 [place] Prevention in the Home Falls can cause injuries. They can happen to people of all ages. There are many things you can do to make your home safe and to help prevent falls. What can I do on the outside of my home?  Regularly fix the edges of walkways and driveways and fix any cracks.  Remove anything that might make you trip as you walk through a door, such as a raised step or threshold.  Trim any bushes or trees on the path to your home.  Use bright outdoor lighting.  Clear any walking paths of anything that might make someone trip, such as rocks or tools.  Regularly check to see if handrails are loose or broken. Make sure that both sides of any steps have handrails.  Any raised decks and porches should have guardrails on the edges.  Have any leaves, snow, or ice cleared regularly.  Use sand or salt on walking paths during winter.  Clean up any spills in your garage right away. This includes oil or grease spills. What can I do in the bathroom?  Use night lights.  Install grab bars by the toilet and in the tub and shower. Do not use towel bars as grab bars.  Use non-skid mats or decals in the tub or shower.  If you need to sit down in the shower, use a plastic, non-slip stool.  Keep the floor dry. Clean up any water that spills on the floor as soon as it happens.  Remove  soap buildup in the tub or shower regularly.  Attach bath mats securely with double-sided non-slip rug tape.  Do not have throw rugs and other things on the floor that can make you trip. What can I do in the bedroom?  Use night lights.  Make sure that you have a light by your bed that is easy to reach.  Do not use any sheets or blankets that are too big for your bed. They should not hang down onto the floor.  Have a firm chair that has side arms. You can use this for support while you get dressed.  Do not have throw rugs and other things on the floor that can make you trip. What can I do in the kitchen?  Clean up any spills right away.  Avoid walking on wet floors.  Keep items that you use a lot in easy-to-reach places.  If you need to reach something above you, use a strong step stool that has a grab bar.  Keep electrical cords out of the way.  Do not use floor polish or wax that makes floors slippery. If you must use wax, use non-skid floor wax.  Do not have throw rugs and other things on the floor that can make you trip. What can  I do with my stairs?  Do not leave any items on the stairs.  Make sure that there are handrails on both sides of the stairs and use them. Fix handrails that are broken or loose. Make sure that handrails are as long as the stairways.  Check any carpeting to make sure that it is firmly attached to the stairs. Fix any carpet that is loose or worn.  Avoid having throw rugs at the top or bottom of the stairs. If you do have throw rugs, attach them to the floor with carpet tape.  Make sure that you have a light switch at the top of the stairs and the bottom of the stairs. If you do not have them, ask someone to add them for you. What else can I do to help prevent falls?  Wear shoes that:  Do not have high heels.  Have rubber bottoms.  Are comfortable and fit you well.  Are closed at the toe. Do not wear sandals.  If you use a  stepladder:  Make sure that it is fully opened. Do not climb a closed stepladder.  Make sure that both sides of the stepladder are locked into place.  Ask someone to hold it for you, if possible.  Clearly mark and make sure that you can see:  Any grab bars or handrails.  First and last steps.  Where the edge of each step is.  Use tools that help you move around (mobility aids) if they are needed. These include:  Canes.  Walkers.  Scooters.  Crutches.  Turn on the lights when you go into a dark area. Replace any light bulbs as soon as they burn out.  Set up your furniture so you have a clear path. Avoid moving your furniture around.  If any of your floors are uneven, fix them.  If there are any pets around you, be aware of where they are.  Review your medicines with your doctor. Some medicines can make you feel dizzy. This can increase your chance of falling. Ask your doctor what other things that you can do to help prevent falls. This information is not intended to replace advice given to you by your health care provider. Make sure you discuss any questions you have with your health care provider. Document Released: 07/04/2009 Document Revised: 02/13/2016 Document Reviewed: 10/12/2014 Elsevier Interactive Patient Education  2017 [employer].

## 2020-08-01 NOTE — Telephone Encounter (Signed)
Left message for patient to schedule Annual Wellness Visit.  Please schedule with Nurse Health Advisor Martha Stanley, RN at [facility]  °

## 2020-08-01 NOTE — Progress Notes (Signed)
Subjective:   Teresa Boyd is a 86 y.o. female who presents for an Initial Medicare Annual Wellness Visit.  I connected with Teresa Boyd today by telephone and verified that I am speaking with the correct person using two identifiers. Location patient: home Location provider: work Persons participating in the virtual visit: patient, Teresa Boyd.    I discussed the limitations, risks, security and privacy concerns of performing an evaluation and management service by telephone and the availability of in person appointments. I also discussed with the patient that there may be a patient responsible charge related to this service. The patient expressed understanding and verbally consented to this telephonic visit.    Interactive audio and video telecommunications were attempted between this provider and patient, however failed, due to patient having technical difficulties OR patient did not have access to video capability.  We continued and completed visit with audio only.  Some vital signs may be absent or patient reported.   Time Spent with patient on telephone encounter: 25 minutes Review of Systems     Cardiac Risk Factors include: advanced age (>52men, >19 women)     Objective:    Today's Vitals   08/01/20 1039  Weight: 136 lb (61.7 kg)  Height: 5\' 3"  (1.6 m)   Body mass index is 24.09 kg/m.  Advanced Directives 08/01/2020 08/29/2019 09/03/2016 08/21/2016 06/26/2016 05/27/2016 05/13/2016  Does Patient Have a Medical Advance Directive? No No No Yes Yes - No  Type of Advance Directive - - - Healthcare Power of [place]  Would patient like information on creating a medical advance directive? No - Patient declined No - Patient declined - - - Yes - Educational materials given No - patient declined information    Current Medications (verified) Outpatient Encounter Medications as of 08/01/2020  Medication Sig  . anastrozole (ARIMIDEX) 1 MG tablet TAKE 1 TABLET  DAILY  . calcitonin, salmon, (MIACALCIN/FORTICAL) 200 UNIT/ACT nasal spray Place 1 spray into alternate nostrils daily.  . Calcium Carbonate (CALCIUM-CARB 600 PO) Take by mouth.  . cetirizine (ZYRTEC) 10 MG tablet Take 10 mg by mouth daily.  . Cholecalciferol (VITAMIN D3) 1000 units CAPS Take by mouth.  . Grape Seed Extract 100 MG CAPS Take 400 each by mouth.  . Multiple Vitamin (MULTIVITAMIN) tablet Take 1 tablet by mouth daily.    . pramipexole (MIRAPEX) 0.25 MG tablet Take 0.25 mg by mouth at bedtime.   No facility-administered encounter medications on file as of 08/01/2020.    Allergies (verified) Sotradecol [sodium tetradecyl sulfate], Celebrex [celecoxib], Clindamycin, Conjugated estrogens, Estradiol benzoate, Estrogens conjugated, Hypaque-m [diatrizone sodium-diatrizone meglumine], Penicillins, and Tramadol   History: Past Medical History:  Diagnosis Date  . Breast cancer ([place]) 2017   left breast  . Colon cancer ([place]) 1994  . History of radiation therapy 06/16/16- 07/13/16   Left Breast  . Hx of adenomatous colonic polyps   . Personal history of radiation therapy   . Varicose vein    Past Surgical History:  Procedure Laterality Date  . ABDOMINAL HYSTERECTOMY    . BREAST BIOPSY Right   . BREAST BIOPSY Right   . BREAST LUMPECTOMY Left 2017  . COLON RESECTION     sigmoid  . COLON SURGERY  1994   colon cancer  . COLONOSCOPY W/ POLYPECTOMY  last 02/18/11   multiple, prior colon cancer and polyps, 7 mm adenoma 2012  . OVARIAN CYST SURGERY    . PARTIAL MASTECTOMY WITH NEEDLE LOCALIZATION Left 04/22/2016  Procedure: LEFT PARTIAL MASTECTOMY WITH DOUBLE NEEDLE LOCALIZATION REEXCISION INFERIOR AND LATERAL MARGIN ADJACENT TISSUE TRANSFER;  Surgeon: [name], MD;  Location: [facility];  Service: General;  Laterality: Left;  . TONSILLECTOMY     Family History  Problem Relation Age of Onset  . Colon cancer Mother 39  . Colon polyps Father 17  . Pancreatic  cancer Neg Hx   . Rectal cancer Neg Hx   . Stomach cancer Neg Hx    Social History   Socioeconomic History  . Marital status: Widowed    Spouse name: Not on file  . Number of children: 1  . Years of education: Not on file  . Highest education level: Not on file  Occupational History  . Not on file  Tobacco Use  . Smoking status: Never Smoker  . Smokeless tobacco: Never Used  Substance and Sexual Activity  . Alcohol use: No  . Drug use: No  . Sexual activity: Not Currently    Birth control/protection: Post-menopausal  Other Topics Concern  . Not on file  Social History Narrative  . Not on file   Social Determinants of Health   Financial Resource Strain: Low Risk   . Difficulty of Paying Living Expenses: Not hard at all  Food Insecurity: No Food Insecurity  . Worried About Charity fundraiser in the Last Year: Never true  . Ran Out of Food in the Last Year: Never true  Transportation Needs: No Transportation Needs  . Lack of Transportation (Medical): No  . Lack of Transportation (Non-Medical): No  Physical Activity: Inactive  . Days of Exercise per Week: 0 days  . Minutes of Exercise per Session: 0 min  Stress: No Stress Concern Present  . Feeling of Stress : Not at all  Social Connections: Moderately Isolated  . Frequency of Communication with Friends and Family: More than three times a week  . Frequency of Social Gatherings with Friends and Family: Once a week  . Attends Religious Services: More than 4 times per year  . Active Member of Clubs or Organizations: No  . Attends Archivist Meetings: Never  . Marital Status: Widowed    Tobacco Counseling Counseling given: Not Answered   Clinical Intake:  Pre-visit preparation completed: Yes  Pain : No/denies pain     Nutritional Status: BMI of 19-24  Normal Nutritional Risks: None Diabetes: No  How often do you need to have someone help you when you read instructions, pamphlets, or other written  materials from your doctor or pharmacy?: 1 - Never What is the last grade level you completed in school?: college  Diabetic?No  Interpreter Needed?: No  Information entered by :: Teresa Hamman LPN   Activities of Daily Living In your present state of health, do you have any difficulty performing the following activities: 08/01/2020  Hearing? Y  Comment hearing loss in left ear  Vision? N  Difficulty concentrating or making decisions? N  Walking or climbing stairs? N  Dressing or bathing? N  Doing errands, shopping? N  Preparing Food and eating ? N  Using the Toilet? N  In the past six months, have you accidently leaked urine? N  Do you have problems with loss of bowel control? N  Managing your Medications? N  Managing your Finances? N  Housekeeping or managing your Housekeeping? N  Some recent data might be hidden    Patient Care Team: Nche, Charlene Brooke, NP as PCP - General (Internal Medicine) Dalbert Batman,  Renelda Loma, MD as Consulting Physician (General Surgery) [name], MD as Consulting Physician (Gastroenterology) Magrinat, [name], MD as Consulting Physician (Oncology) [name], MD as Attending Physician (Radiation Oncology) [name], MD as Consulting Physician (Family Medicine) [name], MD as Consulting Physician (Obstetrics and Gynecology) [name], MD as Consulting Physician (Orthopedic Surgery) [name], NP as Nurse Practitioner (Hematology and Oncology)  Indicate any recent Medical Services you may have received from other than [facility] providers in the past year (date may be approximate).     Assessment:   This is a routine wellness examination for Teresa Boyd.  Hearing/Vision screen  Hearing Screening   125Hz  250Hz  500Hz  1000Hz  2000Hz  3000Hz  4000Hz  6000Hz  8000Hz   Right ear:           Left ear:           Comments: Some hearing loss in left ear  Vision Screening Comments: Last eye exam 2020-[facility] [place]  glasses  Dietary issues and exercise activities discussed: Current Exercise Habits: The patient does not participate in regular exercise at present, Exercise limited by: None identified  Goals    . Patient Stated     Cut back on sweets      Depression Screen PHQ 2/9 Scores 08/01/2020 02/01/2020 08/21/2016 05/13/2016  PHQ - 2 Score 0 0 0 0    Fall Risk Fall Risk  08/01/2020 02/01/2020 02/01/2020 08/21/2016 05/13/2016  Falls in the past year? 0 0 - No No  Number falls in past yr: 0 0 0 - -  Injury with Fall? 0 0 0 - -  Follow up Falls prevention discussed - - - -    Any stairs in or around the home? Yes  If so, are there any without handrails? No  Home free of loose throw rugs in walkways, pet beds, electrical cords, etc? Yes  Adequate lighting in your home to reduce risk of falls? Yes   ASSISTIVE DEVICES UTILIZED TO PREVENT FALLS:  Life alert? No  Use of a cane, walker or w/c? No  Grab bars in the bathroom? Yes  Shower chair or bench in shower? No  Elevated toilet seat or a handicapped toilet? No   TIMED UP AND GO:  Was the test performed? No . Phone visit   Cognitive Function:No cognitive impairment noted        Immunizations Immunization History  Administered Date(s) Administered  . PFIZER SARS-COV-2 Vaccination 10/11/2019, 11/01/2019  . Pneumococcal Conjugate-13 02/13/2015  . Pneumococcal Polysaccharide-23 05/02/2009  . Tdap 04/01/2011  . Zoster 04/10/2011    TDAP status: Up to date   Flu Vaccine status: Declined, Education has been provided regarding the importance of this vaccine but patient still declined. Advised may receive this vaccine at local pharmacy or Health Dept. Aware to provide a copy of the vaccination record if obtained from local pharmacy or Health Dept. Verbalized acceptance and understanding.   Pneumococcal vaccine status: Up to date   Covid-19 vaccine status: Completed vaccines  Qualifies for Shingles Vaccine? Yes   Zostavax completed  Yes   Shingrix Completed?: No.    Education has been provided regarding the importance of this vaccine. Patient has been advised to call insurance company to determine out of pocket expense if they have not yet received this vaccine. Advised may also receive vaccine at local pharmacy or Health Dept. Verbalized acceptance and understanding.  Screening Tests Health Maintenance  Topic Date Due  . INFLUENZA VACCINE  Never done  . TETANUS/TDAP  03/31/2021  .  DEXA SCAN  Completed  . COVID-19 Vaccine  Completed  . PNA vac Low Risk Adult  Completed    Health Maintenance  Health Maintenance Due  Topic Date Due  . INFLUENZA VACCINE  Never done    Colorectal cancer screening: No longer required.    Mammogram status: Completed Bilateral-12/05/2019. Repeat every year   Bone Density status: Due-Patient states her oncologist always orders for her.  Lung Cancer Screening: (Low Dose CT Chest recommended if Age 53-80 years, 30 pack-year currently smoking OR have quit w/in 15years.) does not qualify.     Additional Screening:  Hepatitis C Screening: does not qualify  Vision Screening: Recommended annual ophthalmology exams for early detection of glaucoma and other disorders of the eye. Is the patient up to date with their annual eye exam?  No  Who is the provider or what is the name of the office in which the patient attends annual eye exams? [facility] Patient plans to make an appt soon  Dental Screening: Recommended annual dental exams for proper oral hygiene  Community Resource Referral / Chronic Care Management: CRR required this visit?  No   CCM required this visit?  No      Plan:     I have personally reviewed and noted the following in the patient's chart:   . Medical and social history . Use of alcohol, tobacco or illicit drugs  . Current medications and supplements . Functional ability and status . Nutritional status . Physical activity . Advanced directives . List of  other physicians . Hospitalizations, surgeries, and ER visits in previous 12 months . Vitals . Screenings to include cognitive, depression, and falls . Referrals and appointments  In addition, I have reviewed and discussed with patient certain preventive protocols, quality metrics, and best practice recommendations. A written personalized care plan for preventive services as well as general preventive health recommendations were provided to patient.   Due to this being a telephonic visit, the after visit summary with patients personalized plan was offered to patient via mail or my-chart. Patient declined at this time.  Marta Antu, LPN   [id number]  Nurse Health Advisor  Nurse Notes: None

## 2020-08-12 DIAGNOSIS — M25562 Pain in left knee: Secondary | ICD-10-CM | POA: Diagnosis not present

## 2020-08-12 DIAGNOSIS — M25561 Pain in right knee: Secondary | ICD-10-CM | POA: Diagnosis not present

## 2020-08-12 DIAGNOSIS — M545 Low back pain, unspecified: Secondary | ICD-10-CM | POA: Diagnosis not present

## 2020-08-12 DIAGNOSIS — M25551 Pain in right hip: Secondary | ICD-10-CM | POA: Diagnosis not present

## 2020-08-13 ENCOUNTER — Telehealth: Payer: Self-pay

## 2020-08-13 MED ORDER — CALCITONIN (SALMON) 200 UNIT/ACT NA SOLN: 1.0000 | Freq: Every day | NASAL | 6 refills | Status: DC

## 2020-08-13 NOTE — Telephone Encounter (Signed)
Pt called and states she needs 90 day supply of medication instead of 30 day as it is cheaper for her to have it dispensed over 90 days,.

## 2020-08-14 ENCOUNTER — Other Ambulatory Visit: Payer: Self-pay | Admitting: *Deleted

## 2020-08-19 ENCOUNTER — Other Ambulatory Visit: Payer: Self-pay | Admitting: *Deleted

## 2020-08-21 ENCOUNTER — Ambulatory Visit
Admission: RE | Admit: 2020-08-21 | Discharge: 2020-08-21 | Disposition: A | Discharge status: Home / Self Care | Payer: Medicare Other | Source: Ambulatory Visit | Attending: Otolaryngology | Admitting: Otolaryngology

## 2020-08-21 DIAGNOSIS — G319 Degenerative disease of nervous system, unspecified: Secondary | ICD-10-CM | POA: Diagnosis not present

## 2020-08-21 DIAGNOSIS — H9191 Unspecified hearing loss, right ear: Secondary | ICD-10-CM | POA: Diagnosis not present

## 2020-08-21 DIAGNOSIS — H918X9 Other specified hearing loss, unspecified ear: Secondary | ICD-10-CM

## 2020-08-21 DIAGNOSIS — I6782 Cerebral ischemia: Secondary | ICD-10-CM | POA: Diagnosis not present

## 2020-08-21 DIAGNOSIS — H748X1 Other specified disorders of right middle ear and mastoid: Secondary | ICD-10-CM | POA: Diagnosis not present

## 2020-08-23 ENCOUNTER — Other Ambulatory Visit: Payer: Self-pay | Admitting: *Deleted

## 2020-08-23 MED ORDER — CALCITONIN (SALMON) 200 UNIT/ACT NA SOLN: 1.0000 | Freq: Every day | NASAL | 3 refills | Status: DC

## 2020-08-23 NOTE — Telephone Encounter (Signed)
Pt left VM stating need for new calcitonin prescription for 90 day supply so she only pays $10 for a 3 month supply ( the 3.7 ml is only for 30 days and she has to pay $10 a month for it ).  She also states she is having " my hair fall out in clumps " and is inquiring if the anastrazole could be the cause.  Note pt is now in 4th year of therapy with anastrazole.  This RN attempted to reach pt- obtained AM- message left call was being returned and pt needs to call again.

## 2020-09-05 DIAGNOSIS — M170 Bilateral primary osteoarthritis of knee: Secondary | ICD-10-CM | POA: Diagnosis not present

## 2020-10-04 DIAGNOSIS — H9312 Tinnitus, left ear: Secondary | ICD-10-CM | POA: Diagnosis not present

## 2020-10-04 DIAGNOSIS — H903 Sensorineural hearing loss, bilateral: Secondary | ICD-10-CM | POA: Diagnosis not present

## 2020-10-28 ENCOUNTER — Other Ambulatory Visit: Payer: Self-pay | Admitting: Oncology

## 2020-10-28 DIAGNOSIS — Z170 Estrogen receptor positive status [ER+]: Secondary | ICD-10-CM

## 2020-10-28 DIAGNOSIS — M818 Other osteoporosis without current pathological fracture: Secondary | ICD-10-CM

## 2020-10-28 DIAGNOSIS — Z9889 Other specified postprocedural states: Secondary | ICD-10-CM

## 2020-11-30 DIAGNOSIS — M1611 Unilateral primary osteoarthritis, right hip: Secondary | ICD-10-CM | POA: Diagnosis not present

## 2020-11-30 DIAGNOSIS — M25551 Pain in right hip: Secondary | ICD-10-CM | POA: Diagnosis not present

## 2020-12-05 ENCOUNTER — Other Ambulatory Visit: Payer: Self-pay | Admitting: Orthopedic Surgery

## 2020-12-05 DIAGNOSIS — Z01811 Encounter for preprocedural respiratory examination: Secondary | ICD-10-CM

## 2020-12-11 ENCOUNTER — Other Ambulatory Visit: Payer: Self-pay

## 2020-12-11 ENCOUNTER — Ambulatory Visit
Admission: RE | Admit: 2020-12-11 | Discharge: 2020-12-11 | Disposition: A | Discharge status: Home / Self Care | Payer: Medicare Other | Source: Ambulatory Visit | Attending: Oncology | Admitting: Oncology

## 2020-12-11 DIAGNOSIS — Z9889 Other specified postprocedural states: Secondary | ICD-10-CM

## 2020-12-11 DIAGNOSIS — R922 Inconclusive mammogram: Secondary | ICD-10-CM | POA: Diagnosis not present

## 2020-12-27 ENCOUNTER — Other Ambulatory Visit: Payer: Self-pay

## 2020-12-30 ENCOUNTER — Ambulatory Visit: Payer: Medicare Other | Admitting: Nurse Practitioner

## 2020-12-30 ENCOUNTER — Other Ambulatory Visit: Payer: Self-pay

## 2020-12-30 DIAGNOSIS — C50412 Malignant neoplasm of upper-outer quadrant of left female breast: Secondary | ICD-10-CM

## 2020-12-30 DIAGNOSIS — Z170 Estrogen receptor positive status [ER+]: Secondary | ICD-10-CM

## 2020-12-30 NOTE — Progress Notes (Signed)
[place]  [phone] [phone]     ID: Teresa Boyd DOB: [birth year]  MR#: [id number]  SHF#:[id number]  Patient Care Team: Berkley Harvey, NP as PCP - General (Nurse Practitioner) [name], MD as Consulting Physician (Gastroenterology) [name], [name], MD as Consulting Physician (Oncology) [name], MD as Attending Physician (Radiation Oncology) [name], MD as Consulting Physician (Orthopedic Surgery) OTHER MD:   CHIEF COMPLAINT: Estrogen receptor positive invasive breast cancer  CURRENT TREATMENT: Completing 5 years of anastrozole   INTERVAL HISTORY: Teresa Boyd returns today for follow-up of her estrogen receptor positive breast cancer.   She continues on anastrozole, which she tolerates well. She denies hot flashes or vaginal dryness problems.  She has been able to obtain it at a good price  Her most recent bone density testing on 06/08/2018 showed a T-score of -2.6, which is considered osteoporotic. She is scheduled for repeat on 03/26/2021.  Since her last visit, she underwent bilateral diagnostic mammography with tomography at [facility] on 12/11/2020 showing: breast density category C; no evidence of malignancy in either breast.    REVIEW OF SYSTEMS: Teresa Boyd has significant right hip issues and is planning to have a hip replacement under Dr. Berenice Primas in the near future.  She tells me her son will be here for a week after the surgery and then she has a friend staying with her for the following week.  She is accordingly not walking as much as she normally does but she is doing chair exercises and that is keeping her fit she says.  She still working and has some real estate on [employer]  A detailed review of systems was otherwise stable   COVID 19 VACCINATION STATUS: Teresa Boyd x2, most recently 10/2019   BREAST CANCER HISTORY: From the original intake note:  Teresa Boyd herself palpated a mass in her left breast mid-June  2017. She brought it to the attention of Dr. Coletta Memos who obtained screening mammography showing possible abnormalities in both breasts. Teresa Boyd was then referred to the [facility] where mammography and ultrasonography 03/18/2015 showed in the right breast an 8 mm spiculated mass at the 11:00 position 5 cm from the nipple and a second area of altered architecture also in the upper outer quadrant. In the left breast there was a 2.5 cm is spiculated mass in the upper outer quadrant 4 cm from the nipple. Ultrasound of both axillae were negative.  On 03/17/2016 the patient underwent biopsy of all 3 suspicious areas. In the right breast, both areas showed only fibrocystic change, with no evidence of malignancy ([id number] [id number] and [id number]). This was read as concordant by radiology.  In the left breast however the final pathology (SAA [id number]) showed an invasive ductal carcinoma, grade 2 or 3, estrogen receptor 5% positive, with moderate staining intensity, progesterone receptor negative, with an MIB-1 of 5%, and no HER-2 amplification, the signals ratio being 1.53 and the number per cell 3.45.  Teresa Boyd also has a remote history of colon cancer, in remission for more than 10 years with the most recent colonoscopy 2012 negative.   PAST MEDICAL HISTORY: Past Medical History:  Diagnosis Date  . Breast cancer ([place]) 2017   left breast  . Colon cancer ([place]) 1994  . History of radiation therapy 06/16/16- 07/13/16   Left Breast  . Hx of adenomatous colonic polyps   . Personal history of radiation therapy   . Varicose vein     PAST SURGICAL HISTORY: Past Surgical  History:  Procedure Laterality Date  . ABDOMINAL HYSTERECTOMY    . BREAST BIOPSY Right   . BREAST BIOPSY Right   . BREAST LUMPECTOMY Left 2017  . COLON RESECTION     sigmoid  . COLON SURGERY  1994   colon cancer  . COLONOSCOPY W/ POLYPECTOMY  last 02/18/11   multiple, prior colon cancer and polyps, 7 mm adenoma 2012  . OVARIAN CYST SURGERY     . PARTIAL MASTECTOMY WITH NEEDLE LOCALIZATION Left 04/22/2016   Procedure: LEFT PARTIAL MASTECTOMY WITH DOUBLE NEEDLE LOCALIZATION REEXCISION INFERIOR AND LATERAL MARGIN ADJACENT TISSUE TRANSFER;  Surgeon: [name], MD;  Location: [facility];  Service: General;  Laterality: Left;  . TONSILLECTOMY      FAMILY HISTORY Family History  Problem Relation Age of Onset  . Colon cancer Mother 66  . Colon polyps Father 39  . Pancreatic cancer Neg Hx   . Rectal cancer Neg Hx   . Stomach cancer Neg Hx   The patient's father died at age 38 from postoperative complications in the setting of peptic ulcer disease. The patient's mother died at age 35 with colon cancer. The patient had 5 brothers, 2 sisters. One brother died in an automobile accident. One brother has colitis. A maternal aunt developed cancer in her "female organs" in her 11s. The patient has no further information regarding this. She is not aware of any other cancers in the family aside of course from her own remote colon cancer.   GYNECOLOGIC HISTORY:  No LMP recorded. Patient has had a hysterectomy. Menarche age 87, first live birth age 49. The patient is [place] area in 1955 she underwent total abdominal hysterectomy with bilateral salpingo-oophorectomy. She tried hormone replacement briefly (less than a year). She never took oral contraceptives.   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 She is widowed and lives by herself with no pets. Her son Teresa Boyd is a pulmonologist in [place], [place]. The patient has 4 grandchildren, and no great grandchildren. She attends [employer] locally. 3 of her siblings live in town including her sister Teresa Boyd, who is a retired [employer] consultant    ADVANCED DIRECTIVES:At the 03/27/2016 visit the patient was given the appropriate forms to complete and notarize at her discretion. She intends to name her son Teresa Boyd as healthcare power of attorney. He  can be reached at [phone]   HEALTH MAINTENANCE: Social History   Tobacco Use  . Smoking status: Never Smoker  . Smokeless tobacco: Never Used  Substance Use Topics  . Alcohol use: No  . Drug use: No    Colonoscopy:02/18/2011, [facility]; tubular adenoma with no high-grade dysplasia  Bone density: 06/08/2018, -2.6   Allergies  Allergen Reactions  . Sotradecol [Sodium Tetradecyl Sulfate] Anaphylaxis  . Celebrex [Celecoxib]   . Clindamycin   . Conjugated Estrogens   . Estradiol Benzoate Other (See Comments)    Caused fast heart rate  . Estrogens Conjugated   . Hypaque-M [Diatrizone Sodium-Diatrizone Meglumine] Hives    Ct scan IVP dye  . Penicillins Hives  . Tramadol Nausea Only    Current Outpatient Medications  Medication Sig Dispense Refill  . calcitonin, salmon, (MIACALCIN/FORTICAL) 200 UNIT/ACT nasal spray Place 1 spray into alternate nostrils daily.    . Calcium Carb-Cholecalciferol (CALCIUM 600/VITAMIN D3 PO) Take 2 tablets by mouth daily.    . cetirizine (ZYRTEC) 10 MG tablet Take 10 mg by mouth daily.    . Cholecalciferol (VITAMIN  D3) 50 MCG (2000 UT) TABS Take 2,000 Units by mouth daily.    . diphenhydramine-acetaminophen (TYLENOL PM) 25-500 MG TABS tablet Take 1 tablet by mouth at bedtime as needed (sleep).    . Grape Seed Extract 100 MG CAPS Take 400 each by mouth daily.    . pramipexole (MIRAPEX) 0.25 MG tablet Take 0.25 mg by mouth at bedtime.     No current facility-administered medications for this visit.    OBJECTIVE: Older white woman in no acute distress  Vitals:   12/31/20 1247  BP: 137/73  Pulse: 77  Resp: 18  Temp: (!) 97.5 F (36.4 C)  SpO2: 99%     Body mass index is 24.04 kg/m.    ECOG FS:1 - Symptomatic but completely ambulatory  Sclerae unicteric, EOMs intact Wearing a mask No cervical or supraclavicular adenopathy Lungs no rales or rhonchi Heart regular rate and rhythm Abd soft, nontender, positive bowel sounds MSK no focal  spinal tenderness, no upper extremity lymphedema Neuro: nonfocal, well oriented, positive affect Breasts: The right breast is benign per the left breast has undergone lumpectomy followed by radiation.  There is no evidence of local recurrence.  Both axillae are benign.   LAB RESULTS:  CMP     Component Value Date/Time   NA 141 12/31/2020 1227   NA 139 09/07/2017 1432   K 4.0 12/31/2020 1227   K 4.3 09/07/2017 1432   CL 105 12/31/2020 1227   CO2 26 12/31/2020 1227   CO2 26 09/07/2017 1432   GLUCOSE 117 (H) 12/31/2020 1227   GLUCOSE 100 09/07/2017 1432   BUN 14 12/31/2020 1227   BUN 14.4 09/07/2017 1432   CREATININE 0.67 12/31/2020 1227   CREATININE 0.7 09/07/2017 1432   CALCIUM 9.3 12/31/2020 1227   CALCIUM 9.2 09/07/2017 1432   PROT 7.3 12/31/2020 1227   PROT 7.2 09/07/2017 1432   ALBUMIN 4.0 12/31/2020 1227   ALBUMIN 3.8 09/07/2017 1432   AST 16 12/31/2020 1227   AST 18 09/07/2017 1432   ALT 11 12/31/2020 1227   ALT 13 09/07/2017 1432   ALKPHOS 77 12/31/2020 1227   ALKPHOS 54 09/07/2017 1432   BILITOT 0.8 12/31/2020 1227   BILITOT 0.52 09/07/2017 1432   GFRNONAA >60 12/31/2020 1227   GFRAA >60 01/02/2020 1334    INo results found for: SPEP, UPEP  Lab Results  Component Value Date   WBC 3.5 (L) 12/31/2020   NEUTROABS 1.8 12/31/2020   HGB 13.3 12/31/2020   HCT 41.2 12/31/2020   MCV 94.9 12/31/2020   PLT 199 12/31/2020      Chemistry      Component Value Date/Time   NA 141 12/31/2020 1227   NA 139 09/07/2017 1432   K 4.0 12/31/2020 1227   K 4.3 09/07/2017 1432   CL 105 12/31/2020 1227   CO2 26 12/31/2020 1227   CO2 26 09/07/2017 1432   BUN 14 12/31/2020 1227   BUN 14.4 09/07/2017 1432   CREATININE 0.67 12/31/2020 1227   CREATININE 0.7 09/07/2017 1432      Component Value Date/Time   CALCIUM 9.3 12/31/2020 1227   CALCIUM 9.2 09/07/2017 1432   ALKPHOS 77 12/31/2020 1227   ALKPHOS 54 09/07/2017 1432   AST 16 12/31/2020 1227   AST 18 09/07/2017 1432    ALT 11 12/31/2020 1227   ALT 13 09/07/2017 1432   BILITOT 0.8 12/31/2020 1227   BILITOT 0.52 09/07/2017 1432      No results found for: LABCA2  No components found for: LABCA125  No results for input(s): INR in the last 168 hours.  Urinalysis No results found for: COLORURINE, APPEARANCEUR, LABSPEC, PHURINE, GLUCOSEU, HGBUR, [place], [place], PROTEINUR, UROBILINOGEN, NITRITE, LEUKOCYTESUR   STUDIES: MM DIAG BREAST TOMO BILATERAL  Result Date: 12/11/2020 CLINICAL DATA:  Status post right lumpectomy and radiation therapy for breast cancer in 2017. EXAM: DIGITAL DIAGNOSTIC BILATERAL MAMMOGRAM WITH TOMOSYNTHESIS AND CAD TECHNIQUE: Bilateral digital diagnostic mammography and breast tomosynthesis was performed. The images were evaluated with computer-aided detection. COMPARISON:  Previous exam(s). ACR Breast Density Category c: The breast tissue is heterogeneously dense, which may obscure small masses. FINDINGS: Stable post lumpectomy changes on the left. No interval findings suspicious for malignancy in either breast. IMPRESSION: No evidence of malignancy. RECOMMENDATION: Per protocol, as the patient is now 2 or more years status post lumpectomy, she may return to annual screening mammography in 1 year. However, given the history of breast cancer, the patient remains eligible for annual diagnostic mammography if preferred. I have discussed the findings and recommendations with the patient. If applicable, a reminder letter will be sent to the patient regarding the next appointment. BI-RADS CATEGORY  2: Benign. Electronically Signed   By: Claudie Revering M.D.   On: 12/11/2020 12:39     ELIGIBLE FOR AVAILABLE RESEARCH PROTOCOL: P[facility]AS  ASSESSMENT: 87 y.o. [place] woman status post left breast upper outer quadrant biopsy 03/17/2016 for a clinical T2 N0, stage IIa invasive ductal carcinoma, grade 2 or 3, minimally estrogen receptor positive at 5% with moderate staining intensity,  progesterone receptor and HER-2 negative, with an MIB-1 of 5%  (1) Status post left Lumpectomy without sentinel lymph node sampling 04/22/2016 for apT2 pNX, stage II   invasive ductal carcinoma, grade 3, with negative margins  (a) repeat prognostic panel pending  (2) Mammaprint results "high risk" suggests a risk of recurrence with no systemic treatment of 29%   (a) patient offered adjuvant CMF chemotherapy but opted against it   (3) adjuvant radiation 06/16/16-07/13/16 Site/dose:   1) Left breast / 40.05 Gy in 15 fx                         2) Boost / 10 Gy in 10 Fx  (4) started anastrozole 07/22/2016 -- continued through October 2022  (a) DEXA scan 06/02/2016 shows osteoporosis, with a T score of -2.7  (b) denosumab /Prolia started 10/20/2016, last dose OCT 2020  (c) repeat bone density 06/08/2018 showed a T score of -2.6  (d) repeat bone density JUL 2022 pending   PLAN: Mairany is now close to 5 years out from definitive surgery for breast cancer with no evidence of disease recurrence.  This is very favorable.  She will complete anastrozole later this year.  She has enough supply on hand at the last her through the next several months after which she will simply stop.  There is no need to taper.  Anastrozole does not affect clotting 1 way or the other so she may continue it right to surgery if she wishes or she may discontinue it a few days before and resume a few days after.  Note of those options will make a difference to her breast cancer recurrence risk  At this point I feel comfortable releasing her to her primary care physicians.  All she will need in terms of breast cancer follow-up is her yearly screening mammography and a yearly physician breast exam.  I will be glad to see  Teresa Boyd again at any point in the future if and when the need arises but as of now are making no further routine appointments for her here  Total encounter time 20 minutes.Sarajane Jews C. Tasha Jindra, MD   12/31/20 8:23 PM Medical Oncology and Hematology [facility] [place], [place] [address] Tel. [phone]    Fax. [phone]   I, Wilburn Mylar, am acting as scribe for Dr. [name]. Jaqwon Manfred.  I, Lurline Del MD, have reviewed the above documentation for accuracy and completeness, and I agree with the above.   *Total Encounter Time as defined by the Centers for Medicare and Medicaid Services includes, in addition to the face-to-face time of a patient visit (documented in the note above) non-face-to-face time: obtaining and reviewing outside history, ordering and reviewing medications, tests or procedures, care coordination (communications with other health care professionals or caregivers) and documentation in the medical record.

## 2020-12-31 ENCOUNTER — Other Ambulatory Visit: Payer: Self-pay

## 2020-12-31 ENCOUNTER — Inpatient Hospital Stay (HOSPITAL_BASED_OUTPATIENT_CLINIC_OR_DEPARTMENT_OTHER): Payer: Medicare Other | Admitting: Oncology

## 2020-12-31 ENCOUNTER — Inpatient Hospital Stay: Payer: Medicare Other | Attending: Oncology

## 2020-12-31 VITALS — BP 137/73 | HR 77 | Temp 97.5°F | Resp 18 | Ht 63.0 in | Wt 135.7 lb

## 2020-12-31 DIAGNOSIS — Z85038 Personal history of other malignant neoplasm of large intestine: Secondary | ICD-10-CM | POA: Insufficient documentation

## 2020-12-31 DIAGNOSIS — Z923 Personal history of irradiation: Secondary | ICD-10-CM | POA: Diagnosis not present

## 2020-12-31 DIAGNOSIS — C50412 Malignant neoplasm of upper-outer quadrant of left female breast: Secondary | ICD-10-CM | POA: Insufficient documentation

## 2020-12-31 DIAGNOSIS — Z170 Estrogen receptor positive status [ER+]: Secondary | ICD-10-CM | POA: Diagnosis not present

## 2020-12-31 DIAGNOSIS — Z79899 Other long term (current) drug therapy: Secondary | ICD-10-CM | POA: Insufficient documentation

## 2020-12-31 DIAGNOSIS — M810 Age-related osteoporosis without current pathological fracture: Secondary | ICD-10-CM | POA: Insufficient documentation

## 2020-12-31 DIAGNOSIS — Z79811 Long term (current) use of aromatase inhibitors: Secondary | ICD-10-CM | POA: Diagnosis not present

## 2020-12-31 DIAGNOSIS — Z90722 Acquired absence of ovaries, bilateral: Secondary | ICD-10-CM | POA: Insufficient documentation

## 2020-12-31 DIAGNOSIS — Z90710 Acquired absence of both cervix and uterus: Secondary | ICD-10-CM | POA: Diagnosis not present

## 2020-12-31 LAB — CMP (CANCER CENTER ONLY)
ALT: 11 U/L (ref 0–44)
AST: 16 U/L (ref 15–41)
Albumin: 4 g/dL (ref 3.5–5.0)
Alkaline Phosphatase: 77 U/L (ref 38–126)
Anion gap: 10 (ref 5–15)
BUN: 14 mg/dL (ref 8–23)
CO2: 26 mmol/L (ref 22–32)
Calcium: 9.3 mg/dL (ref 8.9–10.3)
Chloride: 105 mmol/L (ref 98–111)
Creatinine: 0.67 mg/dL (ref 0.44–1.00)
GFR, Estimated: >60 mL/min (ref >60)
Glucose, Bld: 117 mg/dL — ABNORMAL HIGH (ref 70–99)
Potassium: 4 mmol/L (ref 3.5–5.1)
Sodium: 141 mmol/L (ref 135–145)
Total Bilirubin: 0.8 mg/dL (ref 0.3–1.2)
Total Protein: 7.3 g/dL (ref 6.5–8.1)

## 2020-12-31 LAB — CBC WITH DIFFERENTIAL (CANCER CENTER ONLY)
Abs Immature Granulocytes: 0.01 10*3/uL (ref 0.00–0.07)
Basophils Absolute: 0 10*3/uL (ref 0.0–0.1)
Basophils Relative: 1 %
Eosinophils Absolute: 0.1 10*3/uL (ref 0.0–0.5)
Eosinophils Relative: 4 %
HCT: 41.2 % (ref 36.0–46.0)
Hemoglobin: 13.3 g/dL (ref 12.0–15.0)
Immature Granulocytes: 0 %
Lymphocytes Relative: 34 %
Lymphs Abs: 1.2 10*3/uL (ref 0.7–4.0)
MCH: 30.6 pg (ref 26.0–34.0)
MCHC: 32.3 g/dL (ref 30.0–36.0)
MCV: 94.9 fL (ref 80.0–100.0)
Monocytes Absolute: 0.4 10*3/uL (ref 0.1–1.0)
Monocytes Relative: 11 %
Neutro Abs: 1.8 10*3/uL (ref 1.7–7.7)
Neutrophils Relative %: 50 %
Platelet Count: 199 10*3/uL (ref 150–400)
RBC: 4.34 MIL/uL (ref 3.87–5.11)
RDW: 13.2 % (ref 11.5–15.5)
WBC Count: 3.5 10*3/uL — ABNORMAL LOW (ref 4.0–10.5)
nRBC: 0 % (ref 0.0–0.2)

## 2021-01-01 DIAGNOSIS — M25531 Pain in right wrist: Secondary | ICD-10-CM | POA: Diagnosis not present

## 2021-01-02 DIAGNOSIS — R600 Localized edema: Secondary | ICD-10-CM | POA: Diagnosis not present

## 2021-01-02 DIAGNOSIS — R0989 Other specified symptoms and signs involving the circulatory and respiratory systems: Secondary | ICD-10-CM | POA: Diagnosis not present

## 2021-01-02 DIAGNOSIS — Z01818 Encounter for other preprocedural examination: Secondary | ICD-10-CM | POA: Diagnosis not present

## 2021-01-02 DIAGNOSIS — R7309 Other abnormal glucose: Secondary | ICD-10-CM | POA: Diagnosis not present

## 2021-01-02 DIAGNOSIS — M1611 Unilateral primary osteoarthritis, right hip: Secondary | ICD-10-CM | POA: Diagnosis not present

## 2021-01-03 ENCOUNTER — Other Ambulatory Visit: Payer: Self-pay | Admitting: Nurse Practitioner

## 2021-01-03 DIAGNOSIS — R0989 Other specified symptoms and signs involving the circulatory and respiratory systems: Secondary | ICD-10-CM | POA: Diagnosis not present

## 2021-01-03 DIAGNOSIS — R7309 Other abnormal glucose: Secondary | ICD-10-CM | POA: Diagnosis not present

## 2021-01-03 DIAGNOSIS — M1611 Unilateral primary osteoarthritis, right hip: Secondary | ICD-10-CM | POA: Diagnosis not present

## 2021-01-03 DIAGNOSIS — Z01818 Encounter for other preprocedural examination: Secondary | ICD-10-CM | POA: Diagnosis not present

## 2021-01-07 ENCOUNTER — Ambulatory Visit: Payer: Medicare Other | Admitting: Nurse Practitioner

## 2021-01-07 DIAGNOSIS — M25551 Pain in right hip: Secondary | ICD-10-CM | POA: Diagnosis not present

## 2021-01-07 NOTE — Care Plan (Signed)
Ortho Bundle Case Management Note  Patient Details  Name: Teresa Boyd MRN: [id number] Date of Birth: 10-10-1930    Met with patient in the office prior to surgery. She will discharge to home with family to assist. Rolling walker ordered for home use. HHPT referral to [facility] and OPPT set up with [facility]. Patient and MD in agreement with plan. CHoice offered                  DME Arranged:  Walker rolling DME Agency:  [employer]  HH Arranged:  PT [place] Agency:  [facility] ([facility]  Additional Comments: Please contact me with any questions of if this plan should need to change.  Ladell Heads,  [place] [facility]opaedic Specialist  [phone] 01/07/2021, 9:57 AM

## 2021-01-08 ENCOUNTER — Encounter (HOSPITAL_COMMUNITY): Payer: Self-pay

## 2021-01-08 ENCOUNTER — Encounter (HOSPITAL_COMMUNITY)
Admission: RE | Admit: 2021-01-08 | Discharge: 2021-01-08 | Disposition: A | Discharge status: Home / Self Care | Payer: Medicare Other | Source: Ambulatory Visit | Attending: Orthopedic Surgery | Admitting: Orthopedic Surgery

## 2021-01-08 ENCOUNTER — Ambulatory Visit (HOSPITAL_COMMUNITY)
Admission: RE | Admit: 2021-01-08 | Discharge: 2021-01-08 | Disposition: A | Discharge status: Home / Self Care | Payer: Medicare Other | Source: Ambulatory Visit | Attending: Orthopedic Surgery | Admitting: Orthopedic Surgery

## 2021-01-08 ENCOUNTER — Other Ambulatory Visit: Payer: Self-pay

## 2021-01-08 DIAGNOSIS — Z01811 Encounter for preprocedural respiratory examination: Secondary | ICD-10-CM

## 2021-01-08 DIAGNOSIS — Z01818 Encounter for other preprocedural examination: Secondary | ICD-10-CM | POA: Diagnosis not present

## 2021-01-08 HISTORY — DX: Unspecified osteoarthritis, unspecified site: M19.90

## 2021-01-08 LAB — CBC WITH DIFFERENTIAL/PLATELET
Abs Immature Granulocytes: 0.01 10*3/uL (ref 0.00–0.07)
Basophils Absolute: 0 10*3/uL (ref 0.0–0.1)
Basophils Relative: 1 %
Eosinophils Absolute: 0.2 10*3/uL (ref 0.0–0.5)
Eosinophils Relative: 4 %
HCT: 41.5 % (ref 36.0–46.0)
Hemoglobin: 13.3 g/dL (ref 12.0–15.0)
Immature Granulocytes: 0 %
Lymphocytes Relative: 27 %
Lymphs Abs: 1.1 10*3/uL (ref 0.7–4.0)
MCH: 30.9 pg (ref 26.0–34.0)
MCHC: 32 g/dL (ref 30.0–36.0)
MCV: 96.5 fL (ref 80.0–100.0)
Monocytes Absolute: 0.5 10*3/uL (ref 0.1–1.0)
Monocytes Relative: 11 %
Neutro Abs: 2.5 10*3/uL (ref 1.7–7.7)
Neutrophils Relative %: 57 %
Platelets: 224 10*3/uL (ref 150–400)
RBC: 4.3 MIL/uL (ref 3.87–5.11)
RDW: 13.2 % (ref 11.5–15.5)
WBC: 4.2 10*3/uL (ref 4.0–10.5)
nRBC: 0 % (ref 0.0–0.2)

## 2021-01-08 LAB — COMPREHENSIVE METABOLIC PANEL
ALT: 15 U/L (ref 0–44)
AST: 20 U/L (ref 15–41)
Albumin: 4.1 g/dL (ref 3.5–5.0)
Alkaline Phosphatase: 68 U/L (ref 38–126)
Anion gap: 11 (ref 5–15)
BUN: 17 mg/dL (ref 8–23)
CO2: 27 mmol/L (ref 22–32)
Calcium: 10.1 mg/dL (ref 8.9–10.3)
Chloride: 103 mmol/L (ref 98–111)
Creatinine, Ser: 0.63 mg/dL (ref 0.44–1.00)
GFR, Estimated: >60 mL/min (ref >60)
Glucose, Bld: 99 mg/dL (ref 70–99)
Potassium: 4.2 mmol/L (ref 3.5–5.1)
Sodium: 141 mmol/L (ref 135–145)
Total Bilirubin: 0.8 mg/dL (ref 0.3–1.2)
Total Protein: 7.3 g/dL (ref 6.5–8.1)

## 2021-01-08 LAB — APTT: aPTT: 34 seconds (ref 24–36)

## 2021-01-08 LAB — SURGICAL PCR SCREEN
MRSA, PCR: NEGATIVE
Staphylococcus aureus: NEGATIVE

## 2021-01-08 NOTE — Patient Instructions (Addendum)
DUE TO COVID-19 ONLY ONE VISITOR IS ALLOWED TO COME WITH YOU AND STAY IN THE WAITING ROOM ONLY DURING PRE OP AND PROCEDURE DAY OF SURGERY. THE 1 VISITOR  MAY VISIT WITH YOU AFTER SURGERY IN YOUR PRIVATE ROOM DURING VISITING HOURS ONLY!  YOU NEED TO HAVE A COVID 19 TEST ON: 01/10/21 @ 2:00 PM, THIS TEST MUST BE DONE BEFORE SURGERY,  COVID TESTING SITE [place] [address], IT IS ON THE RIGHT GOING OUT [address] APPROXIMATELY  2 MINUTES PAST [employer] ON THE RIGHT. ONCE YOUR COVID TEST IS COMPLETED,  PLEASE BEGIN THE QUARANTINE INSTRUCTIONS AS OUTLINED IN YOUR HANDOUT.                Teresa Boyd   Your procedure is scheduled on: 01/14/21   Report to [facility] Main  Entrance   Report to admitting at: 11:45 AM     Call this number if you have problems the morning of surgery [phone]    Remember:  NO SOLID FOOD AFTER MIDNIGHT THE NIGHT PRIOR TO SURGERY. NOTHING BY MOUTH EXCEPT CLEAR LIQUIDS UNTIL: 11:15 AM . PLEASE FINISH ENSURE DRINK PER SURGEON ORDER  WHICH NEEDS TO BE COMPLETED AT: 11:15 AM .  CLEAR LIQUID DIET  Foods Allowed                                                                     Foods Excluded  Coffee and tea, regular and decaf                             liquids that you cannot  Plain Jell-O any favor except red or purple                                           see through such as: Fruit ices (not with fruit pulp)                                     milk, soups, orange juice  Iced Popsicles                                    All solid food Carbonated beverages, regular and diet                                    Cranberry, grape and apple juices Sports drinks like Gatorade Lightly seasoned clear broth or consume(fat free) Sugar, honey syrup  Sample Menu Breakfast                                Lunch  Supper Cranberry juice                    Beef broth                             Chicken broth Jell-O                                     Grape juice                           Apple juice Coffee or tea                        Jell-O                                      Popsicle                                                Coffee or tea                        Coffee or tea  _____________________________________________________________________   BRUSH YOUR TEETH MORNING OF SURGERY AND RINSE YOUR MOUTH OUT, NO CHEWING GUM CANDY OR MINTS.    Take these medicines the morning of surgery with A SIP OF WATER: cetirizine.                               You may not have any metal on your body including hair pins and              piercings  Do not wear jewelry, make-up, lotions, powders or perfumes, deodorant             Do not wear nail polish on your fingernails.  Do not shave  48 hours prior to surgery.    Do not bring valuables to the hospital. [place].  Contacts, dentures or bridgework may not be worn into surgery.  Leave suitcase in the car. After surgery it may be brought to your room.     Patients discharged the day of surgery will not be allowed to drive home. IF YOU ARE HAVING SURGERY AND GOING HOME THE SAME DAY, YOU MUST HAVE AN ADULT TO DRIVE YOU HOME AND BE WITH YOU FOR 24 HOURS. YOU MAY GO HOME BY TAXI OR UBER OR ORTHERWISE, BUT AN ADULT MUST ACCOMPANY YOU HOME AND STAY WITH YOU FOR 24 HOURS.  Name and phone number of your driver:  Special Instructions: N/A              Please read over the following fact sheets you were given: _____________________________________________________________________        [facility] - Preparing for Surgery Before surgery, you can play an important role.  Because skin is not sterile, your skin needs to be as free of germs as possible.  You can reduce  the number of germs on your skin by washing with CHG (chlorahexidine gluconate) soap before surgery.  CHG is an antiseptic cleaner  which kills germs and bonds with the skin to continue killing germs even after washing. Please DO NOT use if you have an allergy to CHG or antibacterial soaps.  If your skin becomes reddened/irritated stop using the CHG and inform your nurse when you arrive at Short Stay. Do not shave (including legs and underarms) for at least 48 hours prior to the first CHG shower.  You may shave your face/neck. Please follow these instructions carefully:  1.  Shower with CHG Soap the night before surgery and the  morning of Surgery.  2.  If you choose to wash your hair, wash your hair first as usual with your  normal  shampoo.  3.  After you shampoo, rinse your hair and body thoroughly to remove the  shampoo.                           4.  Use CHG as you would any other liquid soap.  You can apply chg directly  to the skin and wash                       Gently with a scrungie or clean washcloth.  5.  Apply the CHG Soap to your body ONLY FROM THE NECK DOWN.   Do not use on face/ open                           Wound or open sores. Avoid contact with eyes, ears mouth and genitals (private parts).                       Wash face,  Genitals (private parts) with your normal soap.             6.  Wash thoroughly, paying special attention to the area where your surgery  will be performed.  7.  Thoroughly rinse your body with warm water from the neck down.  8.  DO NOT shower/wash with your normal soap after using and rinsing off  the CHG Soap.                9.  Pat yourself dry with a clean towel.            10.  Wear clean pajamas.            11.  Place clean sheets on your bed the night of your first shower and do not  sleep with pets. Day of Surgery : Do not apply any lotions/deodorants the morning of surgery.  Please wear clean clothes to the hospital/surgery center.  FAILURE TO FOLLOW THESE INSTRUCTIONS MAY RESULT IN THE CANCELLATION OF YOUR SURGERY PATIENT SIGNATURE_________________________________  NURSE  SIGNATURE__________________________________  ________________________________________________________________________   Teresa Boyd  An incentive spirometer is a tool that can help keep your lungs clear and active. This tool measures how well you are filling your lungs with each breath. Taking long deep breaths may help reverse or decrease the chance of developing breathing (pulmonary) problems (especially infection) following:  A long period of time when you are unable to move or be active. BEFORE THE PROCEDURE   If the spirometer includes an indicator to show your best effort, your nurse or respiratory therapist will set it to  a desired goal.  If possible, sit up straight or lean slightly forward. Try not to slouch.  Hold the incentive spirometer in an upright position. INSTRUCTIONS FOR USE  1. Sit on the edge of your bed if possible, or sit up as far as you can in bed or on a chair. 2. Hold the incentive spirometer in an upright position. 3. Breathe out normally. 4. Place the mouthpiece in your mouth and seal your lips tightly around it. 5. Breathe in slowly and as deeply as possible, raising the piston or the ball toward the top of the column. 6. Hold your breath for 3-5 seconds or for as long as possible. Allow the piston or ball to fall to the bottom of the column. 7. Remove the mouthpiece from your mouth and breathe out normally. 8. Rest for a few seconds and repeat Steps 1 through 7 at least 10 times every 1-2 hours when you are awake. Take your time and take a few normal breaths between deep breaths. 9. The spirometer may include an indicator to show your best effort. Use the indicator as a goal to work toward during each repetition. 10. After each set of 10 deep breaths, practice coughing to be sure your lungs are clear. If you have an incision (the cut made at the time of surgery), support your incision when coughing by placing a pillow or rolled up towels firmly  against it. Once you are able to get out of bed, walk around indoors and cough well. You may stop using the incentive spirometer when instructed by your caregiver.  RISKS AND COMPLICATIONS  Take your time so you do not get dizzy or light-headed.  If you are in pain, you may need to take or ask for pain medication before doing incentive spirometry. It is harder to take a deep breath if you are having pain. AFTER USE  Rest and breathe slowly and easily.  It can be helpful to keep track of a log of your progress. Your caregiver can provide you with a simple table to help with this. If you are using the spirometer at home, follow these instructions: [place] IF:   You are having difficultly using the spirometer.  You have trouble using the spirometer as often as instructed.  Your pain medication is not giving enough relief while using the spirometer.  You develop fever of 100.5 F (38.1 C) or higher. SEEK IMMEDIATE MEDICAL CARE IF:   You cough up bloody sputum that had not been present before.  You develop fever of 102 F (38.9 C) or greater.  You develop worsening pain at or near the incision site. MAKE SURE YOU:   Understand these instructions.  Will watch your condition.  Will get help right away if you are not doing well or get worse. Document Released: 01/18/2007 Document Revised: 11/30/2011 Document Reviewed: 03/21/2007 [facility] Patient Information 2014 [place], [place].   ________________________________________________________________________

## 2021-01-08 NOTE — Progress Notes (Signed)
COVID Vaccine Completed: Yes Date COVID Vaccine completed: 11/01/19 COVID vaccine manufacturer: Pfizer      PCP - Dr. Eldridge Abrahams. Clearance: 01/02/21: EPIC Cardiologist -   Chest x-ray -  EKG -  Stress Test -  ECHO -  Cardiac Cath -  Pacemaker/ICD device last checked:  Sleep Study -  CPAP -   Fasting Blood Sugar -  Checks Blood Sugar _____ times a day  Blood Thinner Instructions: Aspirin Instructions: Last Dose:  Anesthesia review:   Patient denies shortness of breath, fever, cough and chest pain at PAT appointment   Patient verbalized understanding of instructions that were given to them at the PAT appointment. Patient was also instructed that they will need to review over the PAT instructions again at home before surgery.

## 2021-01-09 ENCOUNTER — Ambulatory Visit
Admission: RE | Admit: 2021-01-09 | Discharge: 2021-01-09 | Disposition: A | Discharge status: Home / Self Care | Payer: Medicare Other | Source: Ambulatory Visit | Attending: Nurse Practitioner | Admitting: Nurse Practitioner

## 2021-01-09 ENCOUNTER — Other Ambulatory Visit: Payer: Self-pay | Admitting: Nurse Practitioner

## 2021-01-09 ENCOUNTER — Inpatient Hospital Stay: Admission: RE | Admit: 2021-01-09 | Payer: Medicare Other | Source: Ambulatory Visit

## 2021-01-09 DIAGNOSIS — R0989 Other specified symptoms and signs involving the circulatory and respiratory systems: Secondary | ICD-10-CM | POA: Diagnosis not present

## 2021-01-09 DIAGNOSIS — Z01818 Encounter for other preprocedural examination: Secondary | ICD-10-CM | POA: Diagnosis not present

## 2021-01-09 DIAGNOSIS — I708 Atherosclerosis of other arteries: Secondary | ICD-10-CM | POA: Diagnosis not present

## 2021-01-10 ENCOUNTER — Other Ambulatory Visit (HOSPITAL_COMMUNITY)
Admission: RE | Admit: 2021-01-10 | Discharge: 2021-01-10 | Disposition: A | Discharge status: Home / Self Care | Payer: Medicare Other | Source: Ambulatory Visit | Attending: Orthopedic Surgery | Admitting: Orthopedic Surgery

## 2021-01-10 DIAGNOSIS — Z20822 Contact with and (suspected) exposure to covid-19: Secondary | ICD-10-CM | POA: Diagnosis not present

## 2021-01-10 DIAGNOSIS — Z01812 Encounter for preprocedural laboratory examination: Secondary | ICD-10-CM | POA: Insufficient documentation

## 2021-01-10 LAB — SARS CORONAVIRUS 2 (TAT 6-24 HRS): SARS Coronavirus 2: NEGATIVE

## 2021-01-13 ENCOUNTER — Other Ambulatory Visit: Payer: Self-pay | Admitting: Nurse Practitioner

## 2021-01-13 ENCOUNTER — Inpatient Hospital Stay: Admission: RE | Admit: 2021-01-13 | Payer: Medicare Other | Source: Ambulatory Visit

## 2021-01-13 DIAGNOSIS — R0989 Other specified symptoms and signs involving the circulatory and respiratory systems: Secondary | ICD-10-CM

## 2021-01-13 DIAGNOSIS — R52 Pain, unspecified: Secondary | ICD-10-CM

## 2021-01-13 MED ORDER — BUPIVACAINE LIPOSOME 1.3 % IJ SUSP
10.0000 mL | Freq: Once | INTRAMUSCULAR | Status: DC
  Filled 2021-01-13: qty 10

## 2021-01-14 ENCOUNTER — Other Ambulatory Visit: Payer: Self-pay

## 2021-01-14 ENCOUNTER — Ambulatory Visit (HOSPITAL_COMMUNITY): Payer: Medicare Other | Admitting: Certified Registered"

## 2021-01-14 ENCOUNTER — Observation Stay (HOSPITAL_COMMUNITY)
Admission: EM | Admit: 2021-01-14 | Discharge: 2021-01-15 | Disposition: A | Discharge status: Home / Self Care | Payer: Medicare Other | Source: Other Acute Inpatient Hospital | Attending: Orthopedic Surgery | Admitting: Orthopedic Surgery

## 2021-01-14 ENCOUNTER — Encounter (HOSPITAL_COMMUNITY): Payer: Self-pay | Admitting: Orthopedic Surgery

## 2021-01-14 ENCOUNTER — Ambulatory Visit (HOSPITAL_COMMUNITY): Payer: Medicare Other

## 2021-01-14 ENCOUNTER — Encounter (HOSPITAL_COMMUNITY)
Admission: EM | Disposition: A | Discharge status: Home / Self Care | Payer: Self-pay | Source: Other Acute Inpatient Hospital | Attending: Orthopedic Surgery

## 2021-01-14 DIAGNOSIS — Z853 Personal history of malignant neoplasm of breast: Secondary | ICD-10-CM | POA: Insufficient documentation

## 2021-01-14 DIAGNOSIS — M1611 Unilateral primary osteoarthritis, right hip: Principal | ICD-10-CM

## 2021-01-14 DIAGNOSIS — S72041A Displaced fracture of base of neck of right femur, initial encounter for closed fracture: Secondary | ICD-10-CM

## 2021-01-14 DIAGNOSIS — Z885 Allergy status to narcotic agent status: Secondary | ICD-10-CM | POA: Diagnosis not present

## 2021-01-14 DIAGNOSIS — Z880 Allergy status to penicillin: Secondary | ICD-10-CM | POA: Diagnosis not present

## 2021-01-14 DIAGNOSIS — E559 Vitamin D deficiency, unspecified: Secondary | ICD-10-CM | POA: Diagnosis not present

## 2021-01-14 DIAGNOSIS — Z79899 Other long term (current) drug therapy: Secondary | ICD-10-CM | POA: Insufficient documentation

## 2021-01-14 DIAGNOSIS — M1712 Unilateral primary osteoarthritis, left knee: Secondary | ICD-10-CM | POA: Diagnosis not present

## 2021-01-14 DIAGNOSIS — Z85038 Personal history of other malignant neoplasm of large intestine: Secondary | ICD-10-CM | POA: Diagnosis not present

## 2021-01-14 DIAGNOSIS — Z886 Allergy status to analgesic agent status: Secondary | ICD-10-CM | POA: Insufficient documentation

## 2021-01-14 DIAGNOSIS — Z888 Allergy status to other drugs, medicaments and biological substances status: Secondary | ICD-10-CM | POA: Diagnosis not present

## 2021-01-14 DIAGNOSIS — Z96641 Presence of right artificial hip joint: Secondary | ICD-10-CM

## 2021-01-14 DIAGNOSIS — Z881 Allergy status to other antibiotic agents status: Secondary | ICD-10-CM | POA: Diagnosis not present

## 2021-01-14 DIAGNOSIS — D72819 Decreased white blood cell count, unspecified: Secondary | ICD-10-CM | POA: Diagnosis not present

## 2021-01-14 DIAGNOSIS — Z471 Aftercare following joint replacement surgery: Secondary | ICD-10-CM | POA: Diagnosis not present

## 2021-01-14 HISTORY — PX: TOTAL HIP ARTHROPLASTY: SHX124

## 2021-01-14 HISTORY — DX: Other specified postprocedural states: R11.2

## 2021-01-14 HISTORY — DX: Other specified postprocedural states: Z98.890

## 2021-01-14 LAB — ABO/RH: ABO/RH(D): A POS

## 2021-01-14 LAB — TYPE AND SCREEN
ABO/RH(D): A POS
Antibody Screen: NEGATIVE

## 2021-01-14 LAB — PROTIME-INR
INR: 1 (ref 0.8–1.2)
Prothrombin Time: 13.1 seconds (ref 11.4–15.2)

## 2021-01-14 LAB — GLUCOSE, CAPILLARY: Glucose-Capillary: 233 mg/dL — ABNORMAL HIGH (ref 70–99)

## 2021-01-14 SURGERY — ARTHROPLASTY, HIP, TOTAL, ANTERIOR APPROACH
Anesthesia: Spinal | Site: Hip | Laterality: Bilateral

## 2021-01-14 MED ORDER — ACETAMINOPHEN 500 MG PO TABS
1000.0000 mg | ORAL_TABLET | Freq: Once | ORAL | Status: AC
  Administered 2021-01-14: 1000 mg via ORAL
  Filled 2021-01-14: qty 2

## 2021-01-14 MED ORDER — WATER FOR IRRIGATION, STERILE IR SOLN
Status: DC | PRN
  Administered 2021-01-14: 2000 mL

## 2021-01-14 MED ORDER — ACETAMINOPHEN 325 MG PO TABS
325.0000 mg | ORAL_TABLET | Freq: Four times a day (QID) | ORAL | Status: DC | PRN
  Administered 2021-01-14 – 2021-01-15 (×4): 650 mg via ORAL
  Filled 2021-01-14 (×4): qty 2

## 2021-01-14 MED ORDER — MAGNESIUM CITRATE PO SOLN: 1.0000 | Freq: Once | ORAL | Status: DC | PRN

## 2021-01-14 MED ORDER — OXYCODONE HCL 5 MG PO TABS
10.0000 mg | ORAL_TABLET | ORAL | Status: DC | PRN
  Filled 2021-01-14: qty 2

## 2021-01-14 MED ORDER — ONDANSETRON HCL 4 MG/2ML IJ SOLN
INTRAMUSCULAR | Status: AC
  Filled 2021-01-14: qty 2

## 2021-01-14 MED ORDER — ONDANSETRON HCL 4 MG/2ML IJ SOLN
INTRAMUSCULAR | Status: DC | PRN
  Administered 2021-01-14: 4 mg via INTRAVENOUS

## 2021-01-14 MED ORDER — METHYLPREDNISOLONE ACETATE 40 MG/ML IJ SUSP
INTRAMUSCULAR | Status: DC | PRN
  Administered 2021-01-14: 80 mg

## 2021-01-14 MED ORDER — POLYETHYLENE GLYCOL 3350 17 G PO PACK: 17.0000 g | PACK | Freq: Every day | ORAL | Status: DC | PRN

## 2021-01-14 MED ORDER — LACTATED RINGERS IV SOLN: INTRAVENOUS | Status: DC | PRN

## 2021-01-14 MED ORDER — BUPIVACAINE HCL (PF) 0.25 % IJ SOLN
INTRAMUSCULAR | Status: AC
  Filled 2021-01-14: qty 30

## 2021-01-14 MED ORDER — VANCOMYCIN HCL IN DEXTROSE 1-5 GM/200ML-% IV SOLN
1000.0000 mg | INTRAVENOUS | Status: AC
  Administered 2021-01-14: 1000 mg via INTRAVENOUS
  Filled 2021-01-14: qty 200

## 2021-01-14 MED ORDER — GLYCOPYRROLATE 0.2 MG/ML IJ SOLN
INTRAMUSCULAR | Status: DC | PRN
  Administered 2021-01-14: .2 mg via INTRAVENOUS

## 2021-01-14 MED ORDER — BUPIVACAINE LIPOSOME 1.3 % IJ SUSP
INTRAMUSCULAR | Status: DC | PRN
  Administered 2021-01-14: 10 mL

## 2021-01-14 MED ORDER — AMISULPRIDE (ANTIEMETIC) 5 MG/2ML IV SOLN: 10.0000 mg | Freq: Once | INTRAVENOUS | Status: DC | PRN

## 2021-01-14 MED ORDER — 0.9 % SODIUM CHLORIDE (POUR BTL) OPTIME
TOPICAL | Status: DC | PRN
  Administered 2021-01-14: 1000 mL

## 2021-01-14 MED ORDER — ONDANSETRON HCL 4 MG/2ML IJ SOLN: 4.0000 mg | Freq: Four times a day (QID) | INTRAMUSCULAR | Status: DC | PRN

## 2021-01-14 MED ORDER — PROPOFOL 10 MG/ML IV BOLUS
INTRAVENOUS | Status: DC | PRN
  Administered 2021-01-14: 40 mg via INTRAVENOUS

## 2021-01-14 MED ORDER — ONDANSETRON HCL 4 MG/2ML IJ SOLN: 4.0000 mg | Freq: Once | INTRAMUSCULAR | Status: DC | PRN

## 2021-01-14 MED ORDER — POVIDONE-IODINE 10 % EX SWAB
2.0000 "application " | Freq: Once | CUTANEOUS | Status: AC
  Administered 2021-01-14: 2 via TOPICAL

## 2021-01-14 MED ORDER — BUPIVACAINE-EPINEPHRINE 0.25% -1:200000 IJ SOLN
INTRAMUSCULAR | Status: DC | PRN
  Administered 2021-01-14: 30 mL

## 2021-01-14 MED ORDER — OXYCODONE HCL 5 MG PO TABS
5.0000 mg | ORAL_TABLET | ORAL | Status: DC | PRN
  Administered 2021-01-14: 10 mg via ORAL

## 2021-01-14 MED ORDER — DEXAMETHASONE SODIUM PHOSPHATE 10 MG/ML IJ SOLN
INTRAMUSCULAR | Status: DC | PRN
  Administered 2021-01-14: 5 mg via INTRAVENOUS

## 2021-01-14 MED ORDER — MENTHOL 3 MG MT LOZG: 1.0000 | LOZENGE | OROMUCOSAL | Status: DC | PRN

## 2021-01-14 MED ORDER — DIPHENHYDRAMINE HCL 12.5 MG/5ML PO ELIX
12.5000 mg | ORAL_SOLUTION | ORAL | Status: DC | PRN
  Administered 2021-01-14: 25 mg via ORAL
  Filled 2021-01-14: qty 10

## 2021-01-14 MED ORDER — PHENYLEPHRINE HCL-NACL 10-0.9 MG/250ML-% IV SOLN
INTRAVENOUS | Status: DC | PRN
  Administered 2021-01-14: 25 ug/min via INTRAVENOUS

## 2021-01-14 MED ORDER — METHOCARBAMOL 1000 MG/10ML IJ SOLN
500.0000 mg | Freq: Four times a day (QID) | INTRAVENOUS | Status: DC | PRN
  Filled 2021-01-14: qty 5

## 2021-01-14 MED ORDER — PROPOFOL 500 MG/50ML IV EMUL
INTRAVENOUS | Status: DC | PRN
  Administered 2021-01-14: 50 ug/kg/min via INTRAVENOUS

## 2021-01-14 MED ORDER — TRANEXAMIC ACID-NACL 1000-0.7 MG/100ML-% IV SOLN
1000.0000 mg | INTRAVENOUS | Status: AC
  Administered 2021-01-14: 1000 mg via INTRAVENOUS
  Filled 2021-01-14: qty 100

## 2021-01-14 MED ORDER — FENTANYL CITRATE (PF) 100 MCG/2ML IJ SOLN
INTRAMUSCULAR | Status: AC
  Filled 2021-01-14: qty 2

## 2021-01-14 MED ORDER — BUPIVACAINE HCL (PF) 0.25 % IJ SOLN
INTRAMUSCULAR | Status: DC | PRN
  Administered 2021-01-14: 8 mL

## 2021-01-14 MED ORDER — BISACODYL 5 MG PO TBEC: 5.0000 mg | DELAYED_RELEASE_TABLET | Freq: Every day | ORAL | Status: DC | PRN

## 2021-01-14 MED ORDER — GLYCOPYRROLATE PF 0.2 MG/ML IJ SOSY
PREFILLED_SYRINGE | INTRAMUSCULAR | Status: AC
  Filled 2021-01-14: qty 1

## 2021-01-14 MED ORDER — FENTANYL CITRATE (PF) 100 MCG/2ML IJ SOLN
INTRAMUSCULAR | Status: DC | PRN
  Administered 2021-01-14 (×4): 25 ug via INTRAVENOUS

## 2021-01-14 MED ORDER — METHOCARBAMOL 500 MG PO TABS
500.0000 mg | ORAL_TABLET | Freq: Four times a day (QID) | ORAL | Status: DC | PRN
  Administered 2021-01-14 – 2021-01-15 (×4): 500 mg via ORAL
  Filled 2021-01-14 (×4): qty 1

## 2021-01-14 MED ORDER — METOCLOPRAMIDE HCL 5 MG/ML IJ SOLN: 5.0000 mg | Freq: Three times a day (TID) | INTRAMUSCULAR | Status: DC | PRN

## 2021-01-14 MED ORDER — DOCUSATE SODIUM 100 MG PO CAPS
100.0000 mg | ORAL_CAPSULE | Freq: Two times a day (BID) | ORAL | Status: DC
  Administered 2021-01-14 – 2021-01-15 (×2): 100 mg via ORAL
  Filled 2021-01-14 (×2): qty 1

## 2021-01-14 MED ORDER — ONDANSETRON HCL 4 MG PO TABS: 4.0000 mg | ORAL_TABLET | Freq: Four times a day (QID) | ORAL | Status: DC | PRN

## 2021-01-14 MED ORDER — VANCOMYCIN HCL 1000 MG/200ML IV SOLN
1000.0000 mg | Freq: Once | INTRAVENOUS | Status: AC
  Administered 2021-01-15: 1000 mg via INTRAVENOUS
  Filled 2021-01-14: qty 200

## 2021-01-14 MED ORDER — DEXAMETHASONE SODIUM PHOSPHATE 10 MG/ML IJ SOLN
10.0000 mg | Freq: Two times a day (BID) | INTRAMUSCULAR | Status: DC
  Administered 2021-01-15: 10 mg via INTRAVENOUS
  Filled 2021-01-14: qty 1

## 2021-01-14 MED ORDER — METOCLOPRAMIDE HCL 5 MG PO TABS: 5.0000 mg | ORAL_TABLET | Freq: Three times a day (TID) | ORAL | Status: DC | PRN

## 2021-01-14 MED ORDER — BUPIVACAINE-EPINEPHRINE (PF) 0.25% -1:200000 IJ SOLN
INTRAMUSCULAR | Status: AC
  Filled 2021-01-14: qty 30

## 2021-01-14 MED ORDER — ASPIRIN EC 325 MG PO TBEC
325.0000 mg | DELAYED_RELEASE_TABLET | Freq: Two times a day (BID) | ORAL | Status: DC
  Administered 2021-01-15: 325 mg via ORAL
  Filled 2021-01-14: qty 1

## 2021-01-14 MED ORDER — ALUM & MAG HYDROXIDE-SIMETH 200-200-20 MG/5ML PO SUSP: 30.0000 mL | ORAL | Status: DC | PRN

## 2021-01-14 MED ORDER — METHYLPREDNISOLONE ACETATE 40 MG/ML IJ SUSP
INTRAMUSCULAR | Status: AC
  Filled 2021-01-14: qty 2

## 2021-01-14 MED ORDER — FENTANYL CITRATE (PF) 100 MCG/2ML IJ SOLN: 25.0000 ug | INTRAMUSCULAR | Status: DC | PRN

## 2021-01-14 MED ORDER — BUPIVACAINE IN DEXTROSE 0.75-8.25 % IT SOLN
INTRATHECAL | Status: DC | PRN
  Administered 2021-01-14: 1.8 mL via INTRATHECAL

## 2021-01-14 MED ORDER — DEXAMETHASONE SODIUM PHOSPHATE 10 MG/ML IJ SOLN
INTRAMUSCULAR | Status: AC
  Filled 2021-01-14: qty 1

## 2021-01-14 MED ORDER — PHENOL 1.4 % MT LIQD: 1.0000 | OROMUCOSAL | Status: DC | PRN

## 2021-01-14 MED ORDER — SODIUM CHLORIDE 0.9 % IV SOLN: INTRAVENOUS | Status: DC

## 2021-01-14 MED ORDER — TRANEXAMIC ACID-NACL 1000-0.7 MG/100ML-% IV SOLN
1000.0000 mg | Freq: Once | INTRAVENOUS | Status: AC
  Administered 2021-01-14: 1000 mg via INTRAVENOUS
  Filled 2021-01-14: qty 100

## 2021-01-14 MED ORDER — HYDROMORPHONE HCL 1 MG/ML IJ SOLN: 0.5000 mg | INTRAMUSCULAR | Status: DC | PRN

## 2021-01-14 SURGICAL SUPPLY — 46 items
APL SKNCLS STERI-STRIP NONHPOA (GAUZE/BANDAGES/DRESSINGS) ×1
BAG SPEC THK2 15X12 ZIP CLS (MISCELLANEOUS)
BAG ZIPLOCK 12X15 (MISCELLANEOUS) IMPLANT
BENZOIN TINCTURE PRP APPL 2/3 (GAUZE/BANDAGES/DRESSINGS) ×1 IMPLANT
BLADE HEX COATED 2.75 (ELECTRODE) ×1 IMPLANT
BLADE SAW SGTL 18X1.27X75 (BLADE) ×2 IMPLANT
BLADE SURG SZ10 CARB STEEL (BLADE) ×4 IMPLANT
BNDG ADH 1X3 SHEER STRL LF (GAUZE/BANDAGES/DRESSINGS) ×1 IMPLANT
BNDG ADH THN 3X1 STRL LF (GAUZE/BANDAGES/DRESSINGS) ×1
CLSR STERI-STRIP ANTIMIC 1/2X4 (GAUZE/BANDAGES/DRESSINGS) ×1 IMPLANT
COVER PERINEAL POST (MISCELLANEOUS) ×2 IMPLANT
COVER SURGICAL LIGHT HANDLE (MISCELLANEOUS) ×2 IMPLANT
COVER WAND RF STERILE (DRAPES) IMPLANT
CUP ACETBLR 52 OD 100 SERIES (Hips) ×1 IMPLANT
DECANTER SPIKE VIAL GLASS SM (MISCELLANEOUS) ×2 IMPLANT
DRAPE STERI IOBAN 125X83 (DRAPES) ×2 IMPLANT
DRAPE U-SHAPE 47X51 STRL (DRAPES) ×4 IMPLANT
DRSG AQUACEL AG ADV 3.5X 6 (GAUZE/BANDAGES/DRESSINGS) ×2 IMPLANT
DURAPREP 26ML APPLICATOR (WOUND CARE) ×2 IMPLANT
ELECT BLADE TIP CTD 4 INCH (ELECTRODE) ×3 IMPLANT
ELECT REM PT RETURN 15FT ADLT (MISCELLANEOUS) ×2 IMPLANT
ELIMINATOR HOLE APEX DEPUY (Hips) ×1 IMPLANT
GAUZE XEROFORM 1X8 LF (GAUZE/BANDAGES/DRESSINGS) IMPLANT
GLOVE SRG 8 PF TXTR STRL LF DI (GLOVE) ×2 IMPLANT
GLOVE SURG LTX SZ7.5 (GLOVE) ×4 IMPLANT
GLOVE SURG UNDER POLY LF SZ8 (GLOVE) ×4
GOWN STRL REUS W/TWL XL LVL3 (GOWN DISPOSABLE) ×4 IMPLANT
HEAD M SROM 36MM 2 (Hips) IMPLANT
HOLDER FOLEY CATH W/STRAP (MISCELLANEOUS) ×2 IMPLANT
HOOD PEEL AWAY FLYTE STAYCOOL (MISCELLANEOUS) ×4 IMPLANT
KIT TURNOVER KIT A (KITS) ×2 IMPLANT
LINER ACETAB NEUTRAL 36ID 520D (Liner) ×1 IMPLANT
NEEDLE HYPO 22GX1.5 SAFETY (NEEDLE) ×2 IMPLANT
PACK ANTERIOR HIP CUSTOM (KITS) ×2 IMPLANT
PENCIL SMOKE EVACUATOR (MISCELLANEOUS) IMPLANT
SROM M HEAD 36MM 2 (Hips) ×2 IMPLANT
STAPLER VISISTAT 35W (STAPLE) IMPLANT
STEM CORAIL KLA10 (Stem) ×1 IMPLANT
STRIP CLOSURE SKIN 1/2X4 (GAUZE/BANDAGES/DRESSINGS) IMPLANT
SUT ETHIBOND NAB CT1 #1 30IN (SUTURE) ×4 IMPLANT
SUT MNCRL AB 3-0 PS2 18 (SUTURE) IMPLANT
SUT VIC AB 0 CT1 36 (SUTURE) ×2 IMPLANT
SUT VIC AB 1 CT1 36 (SUTURE) ×2 IMPLANT
SUT VIC AB 2-0 CT1 27 (SUTURE) ×2
SUT VIC AB 2-0 CT1 TAPERPNT 27 (SUTURE) ×1 IMPLANT
TRAY FOLEY MTR SLVR 16FR STAT (SET/KITS/TRAYS/PACK) ×2 IMPLANT

## 2021-01-14 NOTE — H&P (Signed)
TOTAL HIP ADMISSION H&P  Patient is admitted for right total hip arthroplasty.  Subjective:  Chief Complaint: right hip pain  HPI: NOHEA KRAS, 87 y.o. female, has a history of pain and functional disability in the right hip(s) due to arthritis and patient has failed non-surgical conservative treatments for greater than 12 weeks to include NSAID's and/or analgesics, use of assistive devices and activity modification.  Onset of symptoms was gradual starting 8 years ago with gradually worsening course since that time.The patient noted no past surgery on the right hip(s).  Patient currently rates pain in the right hip at 9 out of 10 with activity. Patient has night pain, worsening of pain with activity and weight bearing, trendelenberg gait, pain that interfers with activities of daily living, pain with passive range of motion and joint swelling. Patient has evidence of subchondral sclerosis, periarticular osteophytes and joint space narrowing by imaging studies. This condition presents safety issues increasing the risk of falls. This patient has had failure of all reasonable conservative care.  There is no current active infection.  Patient Active Problem List   Diagnosis Date Noted  . Malignant neoplasm of upper-outer quadrant of left breast in female, estrogen receptor positive ([place]) 03/27/2016  . Increased frequency of urination 01/31/2014  . Allergic rhinitis due to pollen 12/30/2011  . Hx of herpes zoster 12/30/2011  . Leukocytopenia 12/30/2011  . Malignant neoplasm of colon ([place]) 12/30/2011  . Symptoms involving cardiovascular system 12/30/2011  . Uncomplicated varicose veins 12/30/2011  . Vitamin D deficiency 12/30/2011  . HEMORRHOIDS-EXTERNAL 11/08/2008  . HIATAL HERNIA 02/16/2007  . Osteoporosis 02/16/2007  . History of malignant neoplasm of large intestine 02/16/2007   Past Medical History:  Diagnosis Date  . Arthritis   . Breast cancer ([place]) 2017   left breast  . Colon  cancer ([place]) 1994  . History of radiation therapy 06/16/16- 07/13/16   Left Breast  . Hx of adenomatous colonic polyps   . Personal history of radiation therapy   . PONV (postoperative nausea and vomiting)    nausea only  . Varicose vein     Past Surgical History:  Procedure Laterality Date  . ABDOMINAL HYSTERECTOMY    . BREAST BIOPSY Right   . BREAST BIOPSY Right   . BREAST LUMPECTOMY Left 2017  . COLON RESECTION     sigmoid  . COLON SURGERY  1994   colon cancer  . COLONOSCOPY W/ POLYPECTOMY  last 02/18/11   multiple, prior colon cancer and polyps, 7 mm adenoma 2012  . OVARIAN CYST SURGERY    . PARTIAL MASTECTOMY WITH NEEDLE LOCALIZATION Left 04/22/2016   Procedure: LEFT PARTIAL MASTECTOMY WITH DOUBLE NEEDLE LOCALIZATION REEXCISION INFERIOR AND LATERAL MARGIN ADJACENT TISSUE TRANSFER;  Surgeon: [name], MD;  Location: [facility];  Service: General;  Laterality: Left;  . TONSILLECTOMY      Current Facility-Administered Medications  Medication Dose Route Frequency Provider Last Rate Last Admin  . bupivacaine liposome (EXPAREL) 1.3 % injection 133 mg  10 mL Other Once [name], MD      . tranexamic acid (CYKLOKAPRON) IVPB 1,000 mg  1,000 mg Intravenous To OR [name], MD       Allergies  Allergen Reactions  . Sotradecol [Sodium Tetradecyl Sulfate] Anaphylaxis  . Celebrex [Celecoxib]   . Clindamycin   . Conjugated Estrogens   . Estradiol Benzoate Other (See Comments)    Caused fast heart rate  . Estrogens Conjugated   . Hypaque-M [Diatrizone Sodium-Diatrizone Meglumine]  Hives    Ct scan IVP dye  . Penicillins Hives  . Tramadol Nausea Only    Social History   Tobacco Use  . Smoking status: Never Smoker  . Smokeless tobacco: Never Used  Substance Use Topics  . Alcohol use: No    Family History  Problem Relation Age of Onset  . Colon cancer Mother 32  . Colon polyps Father 22  . Pancreatic cancer Neg Hx   . Rectal cancer Neg Hx   .  Stomach cancer Neg Hx      Review of Systems ROS: I have reviewed the patient's review of systems thoroughly and there are no positive responses as relates to the HPI. Objective:  Physical Exam  Vital signs in last 24 hours: Temp:  [98.1 F (36.7 C)] 98.1 F (36.7 C) (04/26 1233) Pulse Rate:  [73] 73 (04/26 1233) Resp:  [14] 14 (04/26 1233) BP: (131)/(61) 131/61 (04/26 1233) SpO2:  [100 %] 100 % (04/26 1233) Well-developed well-nourished patient in no acute distress. Alert and oriented x3 HEENT:within normal limits Cardiac: Regular rate and rhythm Pulmonary: Lungs clear to auscultation Abdomen: Soft and nontender.  Normal active bowel sounds  Musculoskeletal: (R hip: painful rom limited rom NVI distally Labs: Recent Results (from the past 2160 hour(s))  CMP ([place] only)     Status: Abnormal   Collection Time: 12/31/20 12:27 PM  Result Value Ref Range   Sodium 141 135 - 145 mmol/L   Potassium 4.0 3.5 - 5.1 mmol/L   Chloride 105 98 - 111 mmol/L   CO2 26 22 - 32 mmol/L   Glucose, Bld 117 (H) 70 - 99 mg/dL    Comment: Glucose reference range applies only to samples taken after fasting for at least 8 hours.   BUN 14 8 - 23 mg/dL   Creatinine 0.67 0.44 - 1.00 mg/dL   Calcium 9.3 8.9 - 10.3 mg/dL   Total Protein 7.3 6.5 - 8.1 g/dL   Albumin 4.0 3.5 - 5.0 g/dL   AST 16 15 - 41 U/L   ALT 11 0 - 44 U/L   Alkaline Phosphatase 77 38 - 126 U/L   Total Bilirubin 0.8 0.3 - 1.2 mg/dL   GFR, Estimated >60 >60 mL/min    Comment: (NOTE) Calculated using the CKD-EPI Creatinine Equation (2021)    Anion gap 10 5 - 15    Comment: Performed at [facility], [place] [facility] [place] [address], [place], [place] [address]  CBC with Differential ([place] Only)     Status: Abnormal   Collection Time: 12/31/20 12:27 PM  Result Value Ref Range   WBC Count 3.5 (L) 4.0 - 10.5 K/uL   RBC 4.34 3.87 - 5.11 MIL/uL   Hemoglobin 13.3 12.0 - 15.0 g/dL   HCT 41.2 36.0 - 46.0 %    MCV 94.9 80.0 - 100.0 fL   MCH 30.6 26.0 - 34.0 pg   MCHC 32.3 30.0 - 36.0 g/dL   RDW 13.2 11.5 - 15.5 %   Platelet Count 199 150 - 400 K/uL   nRBC 0.0 0.0 - 0.2 %   Neutrophils Relative % 50 %   Neutro Abs 1.8 1.7 - 7.7 K/uL   Lymphocytes Relative 34 %   Lymphs Abs 1.2 0.7 - 4.0 K/uL   Monocytes Relative 11 %   Monocytes Absolute 0.4 0.1 - 1.0 K/uL   Eosinophils Relative 4 %   Eosinophils Absolute 0.1 0.0 - 0.5 K/uL   Basophils Relative 1 %  Basophils Absolute 0.0 0.0 - 0.1 K/uL   Immature Granulocytes 0 %   Abs Immature Granulocytes 0.01 0.00 - 0.07 K/uL    Comment: Performed at [facility], [address], [place], [place] [address]  Surgical pcr screen     Status: None   Collection Time: 01/08/21  3:05 PM   Specimen: Nasal Mucosa; Nasal Swab  Result Value Ref Range   MRSA, PCR NEGATIVE NEGATIVE   Staphylococcus aureus NEGATIVE NEGATIVE    Comment: (NOTE) The Xpert SA Assay (FDA approved for NASAL specimens in patients 58 years of age and older), is one component of a comprehensive surveillance program. It is not intended to diagnose infection nor to guide or monitor treatment. Performed at [facility], [place] [address], [place], [place] [address]   CBC WITH DIFFERENTIAL     Status: None   Collection Time: 01/08/21  3:20 PM  Result Value Ref Range   WBC 4.2 4.0 - 10.5 K/uL   RBC 4.30 3.87 - 5.11 MIL/uL   Hemoglobin 13.3 12.0 - 15.0 g/dL   HCT 41.5 36.0 - 46.0 %   MCV 96.5 80.0 - 100.0 fL   MCH 30.9 26.0 - 34.0 pg   MCHC 32.0 30.0 - 36.0 g/dL   RDW 13.2 11.5 - 15.5 %   Platelets 224 150 - 400 K/uL   nRBC 0.0 0.0 - 0.2 %   Neutrophils Relative % 57 %   Neutro Abs 2.5 1.7 - 7.7 K/uL   Lymphocytes Relative 27 %   Lymphs Abs 1.1 0.7 - 4.0 K/uL   Monocytes Relative 11 %   Monocytes Absolute 0.5 0.1 - 1.0 K/uL   Eosinophils Relative 4 %   Eosinophils Absolute 0.2 0.0 - 0.5 K/uL   Basophils Relative 1 %   Basophils  Absolute 0.0 0.0 - 0.1 K/uL   Immature Granulocytes 0 %   Abs Immature Granulocytes 0.01 0.00 - 0.07 K/uL    Comment: Performed at [facility], [place] [address], [place], [place] [address]  Comprehensive metabolic panel     Status: None   Collection Time: 01/08/21  3:20 PM  Result Value Ref Range   Sodium 141 135 - 145 mmol/L   Potassium 4.2 3.5 - 5.1 mmol/L   Chloride 103 98 - 111 mmol/L   CO2 27 22 - 32 mmol/L   Glucose, Bld 99 70 - 99 mg/dL    Comment: Glucose reference range applies only to samples taken after fasting for at least 8 hours.   BUN 17 8 - 23 mg/dL   Creatinine, Ser 0.63 0.44 - 1.00 mg/dL   Calcium 10.1 8.9 - 10.3 mg/dL   Total Protein 7.3 6.5 - 8.1 g/dL   Albumin 4.1 3.5 - 5.0 g/dL   AST 20 15 - 41 U/L   ALT 15 0 - 44 U/L   Alkaline Phosphatase 68 38 - 126 U/L   Total Bilirubin 0.8 0.3 - 1.2 mg/dL   GFR, Estimated >60 >60 mL/min    Comment: (NOTE) Calculated using the CKD-EPI Creatinine Equation (2021)    Anion gap 11 5 - 15    Comment: Performed at [facility], [place] [address], [place], [place] [address]  APTT     Status: None   Collection Time: 01/08/21  3:20 PM  Result Value Ref Range   aPTT 34 24 - 36 seconds    Comment: Performed at [facility], [place] [address]  [place], [place] [address]  Type and screen Order type and screen if day of surgery is less than 15 days from draw of preadmission visit or order morning of surgery if day of surgery is greater than 6 days from preadmission visit.     Status: None   Collection Time: 01/08/21  3:20 PM  Result Value Ref Range   ABO/RH(D) A POS    Antibody Screen NEG    Sample Expiration 01/17/2021,2359    Extend sample reason      NO TRANSFUSIONS OR PREGNANCY IN THE PAST 3 MONTHS Performed at [facility] [address], [place], [place] [address]   SARS CORONAVIRUS 2 (TAT 6-24 HRS) Nasopharyngeal Nasopharyngeal Swab     Status: None    Collection Time: 01/10/21  3:02 PM   Specimen: Nasopharyngeal Swab  Result Value Ref Range   SARS Coronavirus 2 NEGATIVE NEGATIVE    Comment: (NOTE) SARS-CoV-2 target nucleic acids are NOT DETECTED.  The SARS-CoV-2 RNA is generally detectable in upper and lower respiratory specimens during the acute phase of infection. Negative results do not preclude SARS-CoV-2 infection, do not rule out co-infections with other pathogens, and should not be used as the sole basis for treatment or other patient management decisions. Negative results must be combined with clinical observations, patient history, and epidemiological information. The expected result is Negative.  Fact Sheet for Patients: SugarRoll.be  Fact Sheet for Healthcare Providers: https://www.woods-mathews.com/  This test is not yet approved or cleared by the [place] FDA and  has been authorized for detection and/or diagnosis of SARS-CoV-2 by FDA under an Emergency Use Authorization (EUA). This EUA will remain  in effect (meaning this test can be used) for the duration of the COVID-19 declaration under Se ction 564(b)(1) of the Act, 21 U.S.C. section 360bbb-3(b)(1), unless the authorization is terminated or revoked sooner.  Performed at [facility], [place] [address] [place], [place] [address]   ABO/Rh     Status: None   Collection Time: 01/14/21 12:55 PM  Result Value Ref Range   ABO/RH(D)      A POS Performed at [facility], [place] [address] [place], [place] [address]      Estimated body mass index is 24.69 kg/m as calculated from the following:   Height as of 01/08/21: 5\' 2"  (1.575 m).   Weight as of 01/08/21: 61.2 kg.   Imaging Review Plain radiographs demonstrate severe degenerative joint disease of the right hip(s). The bone quality appears to be fair for age and reported activity level.      Assessment/Plan:  End stage arthritis,  right hip(s)  The patient history, physical examination, clinical judgement of the provider and imaging studies are consistent with end stage degenerative joint disease of the right hip(s) and total hip arthroplasty is deemed medically necessary. The treatment options including medical management, injection therapy, arthroscopy and arthroplasty were discussed at length. The risks and benefits of total hip arthroplasty were presented and reviewed. The risks due to aseptic loosening, infection, stiffness, dislocation/subluxation,  thromboembolic complications and other imponderables were discussed.  The patient acknowledged the explanation, agreed to proceed with the plan and consent was signed. Patient is being admitted for inpatient treatment for surgery, pain control, PT, OT, prophylactic antibiotics, VTE prophylaxis, progressive ambulation and ADL's and discharge planning.The patient is planning to be discharged home with home health services

## 2021-01-14 NOTE — Anesthesia Postprocedure Evaluation (Signed)
Anesthesia Post Note  Patient: Teresa Boyd  Procedure(s) Performed: TOTAL HIP ARTHROPLASTY ANTERIOR APPROACH WITH LEFT KNEE CORTISONE INJECTION (Bilateral Hip)     Patient location during evaluation: PACU Anesthesia Type: Spinal Level of consciousness: awake Pain management: pain level controlled Vital Signs Assessment: post-procedure vital signs reviewed and stable Respiratory status: spontaneous breathing, respiratory function stable and patient connected to nasal cannula oxygen Cardiovascular status: blood pressure returned to baseline and stable Postop Assessment: no headache, no backache and no apparent nausea or vomiting Anesthetic complications: no   No complications documented.  Last Vitals:  Vitals:   01/14/21 1815 01/14/21 1900  BP: (!) 152/74 (!) 147/71  Pulse: 69 67  Resp: 13 19  Temp:  36.6 C  SpO2: 100% 100%    Last Pain:  Vitals:   01/14/21 1900  TempSrc: Oral  PainSc:                  Teresa Boyd

## 2021-01-14 NOTE — Transfer of Care (Signed)
Immediate Anesthesia Transfer of Care Note  Patient: Teresa Boyd  Procedure(s) Performed: TOTAL HIP ARTHROPLASTY ANTERIOR APPROACH WITH LEFT KNEE CORTISONE INJECTION (Bilateral Hip)  Patient Location: PACU  Anesthesia Type:MAC combined with regional for post-op pain  Level of Consciousness: awake, alert , oriented and patient cooperative  Airway & Oxygen Therapy: Patient Spontanous Breathing and Patient connected to face mask oxygen  Post-op Assessment: Report given to RN and Post -op Vital signs reviewed and stable  Post vital signs: Reviewed and stable  Last Vitals:  Vitals Value Taken Time  BP 123/84 01/14/21 1702  Temp    Pulse 68 01/14/21 1704  Resp 12 01/14/21 1704  SpO2 100 % 01/14/21 1704  Vitals shown include unvalidated device data.  Last Pain:  Vitals:   01/14/21 1233  TempSrc: Oral  PainSc: 0-No pain      Patients Stated Pain Goal: 3 (74/25/95 6387)  Complications: No complications documented.

## 2021-01-14 NOTE — Brief Op Note (Signed)
01/14/2021  4:37 PM  PATIENT:  Teresa Boyd  87 y.o. female  PRE-OPERATIVE DIAGNOSIS:  [place] AND LEFT KNEE OSTEOARTHRITIS  POST-OPERATIVE DIAGNOSIS:  RIGH HIP OSTEOARTHRITIS AND LEFT KNEE OSTEOARTHRITIS  PROCEDURE:  Procedure(s): TOTAL HIP ARTHROPLASTY ANTERIOR APPROACH WITH LEFT KNEE CORTISONE INJECTION (Bilateral)  SURGEON:  Surgeon(s) and Role:    [name], MD - Primary  PHYSICIAN ASSISTANT:   ASSISTANTS: mike craig pac   ANESTHESIA:   spinal  EBL:  450 mL   BLOOD ADMINISTERED:none  DRAINS: none   LOCAL MEDICATIONS USED:  MARCAINE    and NONE  SPECIMEN:  No Specimen  DISPOSITION OF SPECIMEN:  N/A  COUNTS:  YES  TOURNIQUET:  * No tourniquets in log *  DICTATION: .Other Dictation: Dictation Number [id number]  PLAN OF CARE: Admit to inpatient   PATIENT DISPOSITION:  PACU - hemodynamically stable.   Delay start of Pharmacological VTE agent (>24hrs) due to surgical blood loss or risk of bleeding: no

## 2021-01-14 NOTE — Anesthesia Procedure Notes (Signed)
Spinal  Patient location during procedure: OR Start time: 01/14/2021 2:45 PM End time: 01/14/2021 2:50 PM Reason for block: surgical anesthesia Staffing Performed: anesthesiologist  Anesthesiologist: Murvin Natal, MD Preanesthetic Checklist Completed: patient identified, IV checked, risks and benefits discussed, surgical consent, monitors and equipment checked, pre-op evaluation and timeout performed Spinal Block Patient position: sitting Prep: DuraPrep Patient monitoring: cardiac monitor, continuous pulse ox and blood pressure Approach: midline Location: L4-5 Injection technique: single-shot Needle Needle type: Pencan  Needle gauge: 24 G Needle length: 9 cm Assessment Sensory level: T10 Events: CSF return Additional Notes Functioning IV was confirmed and monitors were applied. Sterile prep and drape, including hand hygiene and sterile gloves were used. The patient was positioned and the spine was prepped. The skin was anesthetized with lidocaine.  Free flow of clear CSF was obtained prior to injecting local anesthetic into the CSF.  The spinal needle aspirated freely following injection.  The needle was carefully withdrawn.  The patient tolerated the procedure well.

## 2021-01-14 NOTE — Anesthesia Preprocedure Evaluation (Addendum)
Anesthesia Evaluation  Patient identified by MRN, date of birth, ID band Patient awake    Reviewed: Allergy & Precautions, NPO status , Patient's Chart, lab work & pertinent test results  Airway Mallampati: III  TM Distance: >3 FB Neck ROM: Full    Dental no notable dental hx.    Pulmonary neg pulmonary ROS,    Pulmonary exam normal breath sounds clear to auscultation       Cardiovascular negative cardio ROS Normal cardiovascular exam Rhythm:Regular Rate:Normal     Neuro/Psych negative neurological ROS  negative psych ROS   GI/Hepatic negative GI ROS, Neg liver ROS,   Endo/Other  negative endocrine ROS  Renal/GU negative Renal ROS     Musculoskeletal  (+) Arthritis ,   Abdominal   Peds  Hematology negative hematology ROS (+)   Anesthesia Other Findings RIGHT HIP OSTEOARTHRITIS  LEFT KNEE OSTEOARTHRITIS  Reproductive/Obstetrics                           Anesthesia Physical Anesthesia Plan  ASA: II  Anesthesia Plan: Spinal   Post-op Pain Management:    Induction:   PONV Risk Score and Plan: 2 and Ondansetron, Dexamethasone, Propofol infusion and Treatment may vary due to age or medical condition  Airway Management Planned: Simple Face Mask  Additional Equipment:   Intra-op Plan:   Post-operative Plan:   Informed Consent: I have reviewed the patients History and Physical, chart, labs and discussed the procedure including the risks, benefits and alternatives for the proposed anesthesia with the patient or authorized representative who has indicated his/her understanding and acceptance.     Dental advisory given  Plan Discussed with: CRNA  Anesthesia Plan Comments: (Anesthetic plan discussed with patient as well as son via telephone)        Anesthesia Quick Evaluation

## 2021-01-15 ENCOUNTER — Encounter (HOSPITAL_COMMUNITY): Payer: Self-pay | Admitting: Orthopedic Surgery

## 2021-01-15 DIAGNOSIS — Z881 Allergy status to other antibiotic agents status: Secondary | ICD-10-CM | POA: Diagnosis not present

## 2021-01-15 DIAGNOSIS — Z79899 Other long term (current) drug therapy: Secondary | ICD-10-CM | POA: Diagnosis not present

## 2021-01-15 DIAGNOSIS — Z85038 Personal history of other malignant neoplasm of large intestine: Secondary | ICD-10-CM | POA: Diagnosis not present

## 2021-01-15 DIAGNOSIS — Z888 Allergy status to other drugs, medicaments and biological substances status: Secondary | ICD-10-CM | POA: Diagnosis not present

## 2021-01-15 DIAGNOSIS — Z886 Allergy status to analgesic agent status: Secondary | ICD-10-CM | POA: Diagnosis not present

## 2021-01-15 DIAGNOSIS — M1712 Unilateral primary osteoarthritis, left knee: Secondary | ICD-10-CM | POA: Diagnosis not present

## 2021-01-15 DIAGNOSIS — Z853 Personal history of malignant neoplasm of breast: Secondary | ICD-10-CM | POA: Diagnosis not present

## 2021-01-15 DIAGNOSIS — Z885 Allergy status to narcotic agent status: Secondary | ICD-10-CM | POA: Diagnosis not present

## 2021-01-15 DIAGNOSIS — Z880 Allergy status to penicillin: Secondary | ICD-10-CM | POA: Diagnosis not present

## 2021-01-15 DIAGNOSIS — M1611 Unilateral primary osteoarthritis, right hip: Secondary | ICD-10-CM | POA: Diagnosis not present

## 2021-01-15 LAB — GLUCOSE, CAPILLARY
Glucose-Capillary: 133 mg/dL — ABNORMAL HIGH (ref 70–99)
Glucose-Capillary: 152 mg/dL — ABNORMAL HIGH (ref 70–99)

## 2021-01-15 LAB — CBC
HCT: 32.9 % — ABNORMAL LOW (ref 36.0–46.0)
Hemoglobin: 10.6 g/dL — ABNORMAL LOW (ref 12.0–15.0)
MCH: 31.3 pg (ref 26.0–34.0)
MCHC: 32.2 g/dL (ref 30.0–36.0)
MCV: 97.1 fL (ref 80.0–100.0)
Platelets: 172 10*3/uL (ref 150–400)
RBC: 3.39 MIL/uL — ABNORMAL LOW (ref 3.87–5.11)
RDW: 13.1 % (ref 11.5–15.5)
WBC: 9.8 10*3/uL (ref 4.0–10.5)
nRBC: 0 % (ref 0.0–0.2)

## 2021-01-15 NOTE — Progress Notes (Signed)
Physical Therapy Treatment Patient Details Name: Teresa Boyd MRN: [id number] DOB: 03-07-1931 Today's Date: 01/15/2021    History of Present Illness Teresa Boyd is an 87 y.o. female s/p R THA AA on 01/14/21. PMH: breast cancer, colon cancer, arthritis    PT Comments    Pt's son present throughout, all questions answered regarding sequencing and safety with mobility. Pt with improved tolerance to ambulation VCs for upright posture. Stair training completed with RW, supv for safety and VCs for sequencing and foot positioning. All education completed and goals met, pt appropriate to d/c home with family support.    Follow Up Recommendations  Follow surgeon's recommendation for DC plan and follow-up therapies     Equipment Recommendations  Rolling walker with 5" wheels;3in1 (PT)    Recommendations for Other Services       Precautions / Restrictions Precautions Precautions: Fall Restrictions Weight Bearing Restrictions: No    Mobility  Bed Mobility Overal bed mobility: Needs Assistance Bed Mobility: Supine to Sit  Supine to sit: Min guard  General bed mobility comments: in chair upon arrival    Transfers Overall transfer level: Needs assistance Equipment used: Rolling walker (2 wheeled) Transfers: Sit to/from Stand Sit to Stand: Supervision  General transfer comment: VCs for hand placement, good steadiness upon rising  Ambulation/Gait Ambulation/Gait assistance: Supervision Gait Distance (Feet): 150 Feet Assistive device: Rolling walker (2 wheeled) Gait Pattern/deviations: Step-to pattern;Step-through pattern;Decreased stride length Gait velocity: decreased  General Gait Details: step to pattern progressing to step-through, VCs for upright posture   Stairs Stairs: Yes Stairs assistance: Supervision Stair Management: With walker Number of Stairs: 2 General stair comments: pt ascend/descends 2 curb steps with RW, VCs for sequencing and positioning closer to  edge, son present and all questions answered   Wheelchair Mobility    Modified Rankin (Stroke Patients Only)       Balance Overall balance assessment: Needs assistance Sitting-balance support: Feet supported Sitting balance-Leahy Scale: Good Sitting balance - Comments: seated EOB   Standing balance support: During functional activity;Bilateral upper extremity supported Standing balance-Leahy Scale: Poor Standing balance comment: reliant on UE support       Cognition Arousal/Alertness: Awake/alert Behavior During Therapy: WFL for tasks assessed/performed Overall Cognitive Status: Within Functional Limits for tasks assessed       Exercises      General Comments        Pertinent Vitals/Pain Pain Assessment: 0-10 Pain Score: 3  Faces Pain Scale: Hurts little more Pain Location: R hip Pain Descriptors / Indicators: Sore Pain Intervention(s): Limited activity within patient's tolerance;Monitored during session;Repositioned    Home Living Family/patient expects to be discharged to:: Private residence Living Arrangements: Alone Available Help at Discharge: Family;Available 24 hours/day Type of Home: House Home Access: Stairs to enter Entrance Stairs-Rails: None Home Layout: One level Home Equipment: None Additional Comments: Pt's son plans to stay with her 24/7 for 1 week    Prior Function Level of Independence: Independent      Comments: Pt reports independent with community ambulation, drives, completes yardwork, ADLs/IADLs. Pt denies recent falls.   PT Goals (current goals can now be found in the care plan section) Acute Rehab PT Goals Patient Stated Goal: return home with son to assist PT Goal Formulation: With patient/family Time For Goal Achievement: 01/29/21 Potential to Achieve Goals: Good Progress towards PT goals: Progressing toward goals    Frequency    7X/week      PT Plan Current plan remains appropriate    Co-evaluation  AM-PAC PT "6 Clicks" Mobility   Outcome Measure  Help needed turning from your back to your side while in a flat bed without using bedrails?: A Little Help needed moving from lying on your back to sitting on the side of a flat bed without using bedrails?: A Little Help needed moving to and from a bed to a chair (including a wheelchair)?: A Little Help needed standing up from a chair using your arms (e.g., wheelchair or bedside chair)?: A Little Help needed to walk in hospital room?: A Little Help needed climbing 3-5 steps with a railing? : A Little 6 Click Score: 18    End of Session Equipment Utilized During Treatment: Gait belt Activity Tolerance: Patient tolerated treatment well Patient left: in chair;with call bell/phone within reach;with family/visitor present Nurse Communication: Mobility status PT Visit Diagnosis: Other abnormalities of gait and mobility (R26.89);Pain Pain - Right/Left: Right Pain - part of body: Hip     Time: 5051-0712 PT Time Calculation (min) (ACUTE ONLY): 26 min  Charges:  $Gait Training: 23-37 mins                      Tori Danahi Reddish PT, DPT 01/15/21, 4:36 PM

## 2021-01-15 NOTE — Discharge Instructions (Signed)
Total Hip Replacement, Anterior, Care After This sheet gives you information about how to care for yourself after your procedure. Your health care provider may also give you more specific instructions. If you have problems or questions, contact your health care provider. What can I expect after the procedure? After the procedure, it is common to have:  Redness, pain, and swelling at your incision area.  Stiffness.  Discomfort.  A small amount of blood or clear fluid coming from your incision. Follow these instructions at home: Medicines  Take over-the-counter and prescription medicines only as told by your health care provider.  If you were prescribed a blood thinner (anticoagulant) to help prevent blood clots, take it as told by your health care provider.  Ask your health care provider if the medicine prescribed to you: ? Requires you to avoid driving or using machinery. ? Can cause constipation. You may need to take these actions to prevent or treat constipation:  Drink enough fluid to keep your urine pale yellow.  Take over-the-counter or prescription medicines.  Eat foods that are high in fiber, such as beans, whole grains, and fresh fruits and vegetables.  Limit foods that are high in fat and processed sugars, such as fried or sweet foods. Incision care  Follow instructions from your health care provider about how to take care of your incision. Make sure you: ? Wash your hands with soap and water for at least 20 seconds before and after you change your bandage (dressing). If soap and water are not available, use hand sanitizer. ? Change your dressing as told by your health care provider. ? Leave stitches (sutures), staples, skin glue, or adhesive strips in place. These skin closures may need to stay in place for 2 weeks or longer. If adhesive strip edges start to loosen and curl up, you may trim the loose edges. Do not remove adhesive strips completely unless your health care  provider tells you to do that.  Do not take baths, swim, or use a hot tub until your health care provider approves.  Check your incision area every day for signs of infection. Check for: ? More redness, swelling, or pain. ? More fluid or blood. ? Warmth. ? Pus or a bad smell.   Managing pain, stiffness, and swelling  If directed, put ice on the affected area. To do this: ? Put ice in a plastic bag. ? Place a towel between your skin and the bag. ? Leave the ice on for 20 minutes, 2-3 times a day. ? Remove the ice if your skin turns bright red. This is very important. If you cannot feel pain, heat, or cold, you have a greater risk of damage to the area.  Move your toes often to reduce stiffness and swelling.  Raise (elevate) your leg above the level of your heart while you are lying down.   Activity  Ask your health care provider what activities are safe for you.  Avoid sitting for a long time without moving. Get up to take short walks every 1-2 hours. This is important to improve blood flow and breathing. Ask for help if you feel weak or unsteady.  Do exercises as told by your health care provider.  Do not use your legs to support (bear) your body weight until your health care provider says that you can. Follow instructions about how much weight you may safely support on your affected leg (weight-bearing restrictions).  Use a walker, crutches, or a cane as told by  your health care provider.  Return to your normal activities as told by your health care provider. Movement restrictions  To help prevent hip dislocation, follow instructions about movement restrictions as told. For example: ? Do not cross your legs at the knees. To remind yourself about this, you may keep a pillow between your legs while lying in bed. ? Do not bend at the hip and waist or bend farther than 90 degrees of hip flexion. To avoid bending this far:  Do not bring your knees higher than your hips.  Do not  pick up something from the floor while sitting in a chair.  Avoid sitting in low chairs.  Use a raised toilet seat.  When standing up from a seated position, keep the injured leg out in front of you. ? Avoid twisting at your waist and reaching across your body to the side of the affected leg. ? Avoid rotating your toes inward.  When getting into a car: 1. Raise the seat as high as possible, move the seat as far back as it will go, and recline the upper part of the seat slightly. 2. Sit down into the seat with your injured leg extended out of the car. 3. Scoot back into the seat as you move the lower half of your body into the car. Try to avoid bumping your foot or leg as you bring it into the car.   Safety  To help prevent falls, keep floors clear of objects you may trip over, and place items that you may need within easy reach.  Wear an apron or tool belt with pockets for carrying objects. This leaves your hands free to help with your balance. General instructions  Ask your health care provider when it is safe to drive.  Wear compression stockings as told by your health care provider. These stockings help to prevent blood clots and reduce swelling in your legs.  Keep doing breathing exercises as told. These help prevent lung infection.  Do not use any products that contain nicotine or tobacco, such as cigarettes, e-cigarettes, and chewing tobacco. These can delay healing after surgery. If you need help quitting, ask your health care provider.  Tell your health care provider if you plan to have dental work. Also: ? Tell your dentist about your joint replacement. ? Ask your health care provider if there are any special instructions you need to follow before having dental care and routine cleanings.  Keep all follow-up visits. This is important. Contact a health care provider if:  You have a fever or chills.  You have a cough.  Your medicine is not controlling your pain.  You  have more redness, swelling, or pain around your incision.  You have more fluid or blood coming from your incision.  Your incision feels warm to the touch.  You have pus or a bad smell coming from your incision. Get help right away if:  You have severe pain.  You have shortness of breath or trouble breathing.  You have chest pain.  You have redness, swelling, pain, or warmth in your calf or leg.  Your incision breaks open after suturesor staples are removed. These symptoms may represent a serious problem that is an emergency. Do not wait to see if the symptoms will go away. Get medical help right away. Call your local emergency services (911 in the [place] Do not drive yourself to the hospital. Summary  Do not use your legs to support your body weight until  your health care provider says that you can. Use a walker, crutches, or a cane as told.  To help prevent hip dislocation, follow instructions about movement restrictions as told.  Keep all follow-up visits. This is important. This information is not intended to replace advice given to you by your health care provider. Make sure you discuss any questions you have with your health care provider. Document Revised: 02/01/2020 Document Reviewed: 02/01/2020 Elsevier Patient Education  [employer].

## 2021-01-15 NOTE — Progress Notes (Signed)
Patient discharged to home w/ son. Given all belongings, instructions, equipment. Verbalized understanding of all instructions. Escorted to pov via w/c. 

## 2021-01-15 NOTE — TOC Transition Note (Signed)
Transition of Care [facility]) - CM/SW Discharge Note  Patient Details  Name: Teresa Boyd MRN: [id number] Date of Birth: 11/30/1930  [facility] CM/SW Contact:  Sherie Don, LCSW Phone Number: 01/15/2021, 10:25 AM  Clinical Narrative: Patient expected to discharge home today after working with PT. CSW met with patient and her son, Teresa Boyd, to confirm discharge plan. Patient will receive HHPT through [facility]. CSW confirmed patient will need rolling walker; patient reported she will also need a 3N1. [employer] to deliver RW and 3N1 to patient's room. CSW notified Ronalee Belts with [facility] that patient is expected to discharge home today. TOC signing off.  Final next level of care: OP Rehab Barriers to Discharge: No Barriers Identified  Patient Goals and CMS Choice Patient states their goals for this hospitalization and ongoing recovery are:: Discharge home with HHPT through [facility] Choice offered to / list presented to : Patient  Discharge Plan and Services        DME Arranged: 3-N-1,Walker rolling DME Agency: [employer] Date DME Agency Contacted: 01/15/21 Representative spoke with at DME Agency: Nathan/[facility] HH Arranged: PT [place]: [facility] [facility] [employer] Representative spoke with at [place]: Pre-arranged before surgery  Readmission Risk Interventions No flowsheet data found.

## 2021-01-15 NOTE — Evaluation (Signed)
Physical Therapy Evaluation Patient Details Name: Teresa Boyd MRN: [id number] DOB: [birth year] Today's Date: 01/15/2021   History of Present Illness  Teresa Boyd is an 87 y.o. female s/p R THA AA on 01/14/21. PMH: breast cancer, colon cancer, arthritis  Clinical Impression  Pt is s/p R THA AA 01/14/21 resulting in the deficits listed below (see PT Problem List). Pt independent at baseline, currently ambulating 80 ft with RW, step to pattern, no LOB and min G for safety. Educated pt on transfers with RW, hand placement, LE positioning and RW use with good carryover. Pt's son in room states he will be staying with pt 24/7 for 1 week while she recovers; reviewed transfers and answered all questions. Pt fatigued after gait training, plan to complete stair training this afternoon. Pt will benefit from skilled PT to increase their independence and safety with mobility to allow discharge to the venue listed below.      Follow Up Recommendations Follow surgeon's recommendation for DC plan and follow-up therapies    Equipment Recommendations  Rolling walker with 5" wheels;3in1 (PT)    Recommendations for Other Services       Precautions / Restrictions Precautions Precautions: Fall Restrictions Weight Bearing Restrictions: No      Mobility  Bed Mobility Overal bed mobility: Needs Assistance Bed Mobility: Supine to Sit  Supine to sit: Min guard  General bed mobility comments: increased time, initial self assist with RLE progressing to RLE mobilizing independently    Transfers Overall transfer level: Needs assistance Equipment used: Rolling walker (2 wheeled) Transfers: Sit to/from Stand Sit to Stand: Min guard  General transfer comment: VCs for hand placement to power up  Ambulation/Gait Ambulation/Gait assistance: Min guard Gait Distance (Feet): 80 Feet Assistive device: Rolling walker (2 wheeled) Gait Pattern/deviations: Step-to pattern;Decreased stride length;Decreased  weight shift to right Gait velocity: decreased   General Gait Details: step to pattern, decreased weight-shift R, able to perform turns without LOB  Stairs            Wheelchair Mobility    Modified Rankin (Stroke Patients Only)       Balance Overall balance assessment: Needs assistance Sitting-balance support: Feet supported Sitting balance-Leahy Scale: Good Sitting balance - Comments: seated EOB   Standing balance support: During functional activity;Bilateral upper extremity supported Standing balance-Leahy Scale: Poor Standing balance comment: reliant on UE support        Pertinent Vitals/Pain Pain Assessment: Faces Faces Pain Scale: Hurts little more Pain Location: R hip Pain Descriptors / Indicators: Grimacing;Guarding Pain Intervention(s): Limited activity within patient's tolerance;Monitored during session;Repositioned;Ice applied    Home Living Family/patient expects to be discharged to:: Private residence Living Arrangements: Alone Available Help at Discharge: Family;Available 24 hours/day Type of Home: House Home Access: Stairs to enter Entrance Stairs-Rails: None Entrance Stairs-Number of Steps: 1 to porch then 1 into home Home Layout: One level Home Equipment: None Additional Comments: Pt's son plans to stay with her 24/7 for 1 week    Prior Function Level of Independence: Independent         Comments: Pt reports independent with community ambulation, drives, completes yardwork, ADLs/IADLs. Pt denies recent falls.     Hand Dominance        Extremity/Trunk Assessment   Upper Extremity Assessment Upper Extremity Assessment: Overall WFL for tasks assessed    Lower Extremity Assessment Lower Extremity Assessment: RLE deficits/detail RLE Deficits / Details: AROM WNL, able to perform LAQ, quad set, hip abd without difficulty, hip and knee  strength 4-/5 RLE Sensation: WNL    Cervical / Trunk Assessment Cervical / Trunk Assessment:  Kyphotic  Communication   Communication: No difficulties  Cognition Arousal/Alertness: Awake/alert Behavior During Therapy: WFL for tasks assessed/performed Overall Cognitive Status: Within Functional Limits for tasks assessed       General Comments      Exercises     Assessment/Plan    PT Assessment Patient needs continued PT services  PT Problem List Decreased strength;Decreased activity tolerance;Decreased balance;Decreased knowledge of use of DME;Pain       PT Treatment Interventions DME instruction;Gait training;Stair training;Functional mobility training;Therapeutic activities;Therapeutic exercise;Balance training;Patient/family education;Modalities    PT Goals (Current goals can be found in the Care Plan section)  Acute Rehab PT Goals Patient Stated Goal: return home with son to assist PT Goal Formulation: With patient/family Time For Goal Achievement: 01/29/21 Potential to Achieve Goals: Good    Frequency 7X/week   Barriers to discharge        Co-evaluation               AM-PAC PT "6 Clicks" Mobility  Outcome Measure Help needed turning from your back to your side while in a flat bed without using bedrails?: A Little Help needed moving from lying on your back to sitting on the side of a flat bed without using bedrails?: A Little Help needed moving to and from a bed to a chair (including a wheelchair)?: A Little Help needed standing up from a chair using your arms (e.g., wheelchair or bedside chair)?: A Little Help needed to walk in hospital room?: A Little Help needed climbing 3-5 steps with a railing? : A Lot 6 Click Score: 17    End of Session Equipment Utilized During Treatment: Gait belt Activity Tolerance: Patient tolerated treatment well Patient left: in chair;with call bell/phone within reach;with chair alarm set;with nursing/sitter in room;with family/visitor present Nurse Communication: Mobility status PT Visit Diagnosis: Other abnormalities  of gait and mobility (R26.89);Pain Pain - Right/Left: Right Pain - part of body: Hip    Time: 0910-0953 PT Time Calculation (min) (ACUTE ONLY): 43 min   Charges:   PT Evaluation $PT Eval Low Complexity: 1 Low PT Treatments $Gait Training: 8-22 mins $Therapeutic Activity: 8-22 mins         Tori Myrick Mcnairy PT, DPT 01/15/21, 2:16 PM

## 2021-01-16 NOTE — Op Note (Signed)
Teresa, Boyd MEDICAL RECORD NO: [id number] ACCOUNT NO: [id number] DATE OF BIRTH: 01-24-1931 FACILITY: [facility] LOCATION: [id number] Preoperative diagnosis end-stage degenerative joint disease right hip.  #2 severely arthritic left knee with pain with range of motion. Postoperative diagnosis same as above [place] procedure #1 right total hip replacement.  #2 intra-articular injection left knee PHYSICIAN: Alta Corning, MD  Operative Report   DATE OF PROCEDURE: 01/14/2021  INDICATIONS:  The patient is an 87 year old female with a long history of having right hip pain.  She has been treated conservatively for a prolonged period of time.  Over the years, we saw her hip narrow and migrate medially.  She began having  unrelenting groin pain and after discussion of potential risks and treatment options, she opted for total hip replacement.  She was brought to the operating room for anterior total hip replacement.  DESCRIPTION OF PROCEDURE:  The patient was brought to the operating room.  After adequate anesthesia obtained with a spinal anesthetic, the patient was placed supine on the operating table, was then moved onto the Hana bed and all bony prominences well  padded.  Attention at this time was turned to the left knee were under sterile technique the knee was injected with 6 cc Marcaine and 2 cc of 40 mg/cc Depo-Medrol.  The entry site was dressed with a Band-Aid.  Attention was turned to the right hip where an incision was made after routine prep and drape for an anterior approach of the hip, subcutaneous tissue down to the level of the tensor fascia which was identified, fibers.  The fascia was opened  and the muscle was finger dissected.  Retractors were put in place above and below the femoral neck.  The vessels were cauterized in the interval and attention was then turned towards the head and neck segment.  The capsule was then opened and tagged and  the provisional neck cut was made and the  head was removed.  Following this, the acetabulum was exposed.  Labrectomy was performed and the acetabulum was sequentially reamed to a level of 51 mm and a 52 mm Pinnacle porous coated cup was hammered into  place, 45 degrees of lateral opening, 30 degrees of anteversion.  Once this was done, a neutral liner was placed in the cup after hole eliminator being placed.  Attention was then turned to the stem side, where retractor was put in place behind the  femur.  The femur was extended and externally rotated and adducted and this brought the femur nicely up into the wound.  The neck was opened with a combination of a rongeur followed by cookie cutter and following this, chili pepper was used to open the  canal.  A size 8 broach was then used and then we sequentially broached her up to a size 10, which got excellent fit and fill.  Once that was completed, attention was turned towards trial reduction.  Trial reduction was undertaken.  She was really long  with standard offset.  I felt preoperatively she is going to need high offset and we had the high offset stem there and trialed it with a minus ball and got her back to her preoperative leg length.  She does have a big spinal obliquity, so we wanted to  be careful about that.  Once we got this completed, we took the trial broach out and ultimately put the final Corail stem in, size 10 with high offset and a minus ball and this construct  was put in place and then the hip reduced.  Images were taken  again, showing excellent position of the hip stem, good fit and fill and excellent position of the acetabulum.  We did a trial stability test intraoperatively with 60 of extension and 90 of external rotation.  The hip was nice and stable.  At this point,  the wounds were copiously irrigated and suctioned dry.  We closed the capsule with 1 Vicryl running, closed the tensor fascia with 0 Vicryl running, 0 and 2-0 Vicryl for the subcutaneous tissues and then 3-0  Monocryl subcuticular.  Benzoin and  Steri-Strips were applied.  Sterile compressive dressing was applied and the patient was taken to recovery, was noted to be in satisfactory condition.  Of note, fluoroscopy was used multiple times throughout the case to assess cup position, leg length,  fit and fill of the stem, etc.  Harmon Dun was present for the entire case and assisted by retraction of tissues and manipulation of the leg and closing to minimize OR time.  Estimated blood loss of the procedure was 450 mL.  There were no  complications.   SHW D: 01/14/2021 4:43:46 pm T: 01/15/2021 4:34:00 am  JOB: [id number]/ [id number]

## 2021-01-17 DIAGNOSIS — M150 Primary generalized (osteo)arthritis: Secondary | ICD-10-CM | POA: Diagnosis not present

## 2021-01-17 DIAGNOSIS — B029 Zoster without complications: Secondary | ICD-10-CM | POA: Diagnosis not present

## 2021-01-17 DIAGNOSIS — J301 Allergic rhinitis due to pollen: Secondary | ICD-10-CM | POA: Diagnosis not present

## 2021-01-17 DIAGNOSIS — M810 Age-related osteoporosis without current pathological fracture: Secondary | ICD-10-CM | POA: Diagnosis not present

## 2021-01-17 DIAGNOSIS — K649 Unspecified hemorrhoids: Secondary | ICD-10-CM | POA: Diagnosis not present

## 2021-01-17 DIAGNOSIS — Z96641 Presence of right artificial hip joint: Secondary | ICD-10-CM | POA: Diagnosis not present

## 2021-01-17 DIAGNOSIS — I8390 Asymptomatic varicose veins of unspecified lower extremity: Secondary | ICD-10-CM | POA: Diagnosis not present

## 2021-01-17 DIAGNOSIS — K449 Diaphragmatic hernia without obstruction or gangrene: Secondary | ICD-10-CM | POA: Diagnosis not present

## 2021-01-17 DIAGNOSIS — Z86010 Personal history of colonic polyps: Secondary | ICD-10-CM | POA: Diagnosis not present

## 2021-01-17 DIAGNOSIS — Z85038 Personal history of other malignant neoplasm of large intestine: Secondary | ICD-10-CM | POA: Diagnosis not present

## 2021-01-17 DIAGNOSIS — Z471 Aftercare following joint replacement surgery: Secondary | ICD-10-CM | POA: Diagnosis not present

## 2021-01-17 DIAGNOSIS — Z853 Personal history of malignant neoplasm of breast: Secondary | ICD-10-CM | POA: Diagnosis not present

## 2021-01-21 DIAGNOSIS — B029 Zoster without complications: Secondary | ICD-10-CM | POA: Diagnosis not present

## 2021-01-21 DIAGNOSIS — M810 Age-related osteoporosis without current pathological fracture: Secondary | ICD-10-CM | POA: Diagnosis not present

## 2021-01-21 DIAGNOSIS — K649 Unspecified hemorrhoids: Secondary | ICD-10-CM | POA: Diagnosis not present

## 2021-01-21 DIAGNOSIS — M150 Primary generalized (osteo)arthritis: Secondary | ICD-10-CM | POA: Diagnosis not present

## 2021-01-21 DIAGNOSIS — Z86010 Personal history of colonic polyps: Secondary | ICD-10-CM | POA: Diagnosis not present

## 2021-01-21 DIAGNOSIS — Z96641 Presence of right artificial hip joint: Secondary | ICD-10-CM | POA: Diagnosis not present

## 2021-01-21 DIAGNOSIS — Z853 Personal history of malignant neoplasm of breast: Secondary | ICD-10-CM | POA: Diagnosis not present

## 2021-01-21 DIAGNOSIS — K449 Diaphragmatic hernia without obstruction or gangrene: Secondary | ICD-10-CM | POA: Diagnosis not present

## 2021-01-21 DIAGNOSIS — Z471 Aftercare following joint replacement surgery: Secondary | ICD-10-CM | POA: Diagnosis not present

## 2021-01-21 DIAGNOSIS — I8390 Asymptomatic varicose veins of unspecified lower extremity: Secondary | ICD-10-CM | POA: Diagnosis not present

## 2021-01-21 DIAGNOSIS — J301 Allergic rhinitis due to pollen: Secondary | ICD-10-CM | POA: Diagnosis not present

## 2021-01-21 DIAGNOSIS — Z85038 Personal history of other malignant neoplasm of large intestine: Secondary | ICD-10-CM | POA: Diagnosis not present

## 2021-01-22 DIAGNOSIS — K449 Diaphragmatic hernia without obstruction or gangrene: Secondary | ICD-10-CM | POA: Diagnosis not present

## 2021-01-22 DIAGNOSIS — I8390 Asymptomatic varicose veins of unspecified lower extremity: Secondary | ICD-10-CM | POA: Diagnosis not present

## 2021-01-22 DIAGNOSIS — B029 Zoster without complications: Secondary | ICD-10-CM | POA: Diagnosis not present

## 2021-01-22 DIAGNOSIS — Z85038 Personal history of other malignant neoplasm of large intestine: Secondary | ICD-10-CM | POA: Diagnosis not present

## 2021-01-22 DIAGNOSIS — K649 Unspecified hemorrhoids: Secondary | ICD-10-CM | POA: Diagnosis not present

## 2021-01-22 DIAGNOSIS — J301 Allergic rhinitis due to pollen: Secondary | ICD-10-CM | POA: Diagnosis not present

## 2021-01-22 DIAGNOSIS — Z86010 Personal history of colonic polyps: Secondary | ICD-10-CM | POA: Diagnosis not present

## 2021-01-22 DIAGNOSIS — Z853 Personal history of malignant neoplasm of breast: Secondary | ICD-10-CM | POA: Diagnosis not present

## 2021-01-22 DIAGNOSIS — Z471 Aftercare following joint replacement surgery: Secondary | ICD-10-CM | POA: Diagnosis not present

## 2021-01-22 DIAGNOSIS — M150 Primary generalized (osteo)arthritis: Secondary | ICD-10-CM | POA: Diagnosis not present

## 2021-01-22 DIAGNOSIS — M810 Age-related osteoporosis without current pathological fracture: Secondary | ICD-10-CM | POA: Diagnosis not present

## 2021-01-22 DIAGNOSIS — Z96641 Presence of right artificial hip joint: Secondary | ICD-10-CM | POA: Diagnosis not present

## 2021-01-24 DIAGNOSIS — M810 Age-related osteoporosis without current pathological fracture: Secondary | ICD-10-CM | POA: Diagnosis not present

## 2021-01-24 DIAGNOSIS — I8390 Asymptomatic varicose veins of unspecified lower extremity: Secondary | ICD-10-CM | POA: Diagnosis not present

## 2021-01-24 DIAGNOSIS — K649 Unspecified hemorrhoids: Secondary | ICD-10-CM | POA: Diagnosis not present

## 2021-01-24 DIAGNOSIS — Z85038 Personal history of other malignant neoplasm of large intestine: Secondary | ICD-10-CM | POA: Diagnosis not present

## 2021-01-24 DIAGNOSIS — B029 Zoster without complications: Secondary | ICD-10-CM | POA: Diagnosis not present

## 2021-01-24 DIAGNOSIS — Z96641 Presence of right artificial hip joint: Secondary | ICD-10-CM | POA: Diagnosis not present

## 2021-01-24 DIAGNOSIS — J301 Allergic rhinitis due to pollen: Secondary | ICD-10-CM | POA: Diagnosis not present

## 2021-01-24 DIAGNOSIS — Z86010 Personal history of colonic polyps: Secondary | ICD-10-CM | POA: Diagnosis not present

## 2021-01-24 DIAGNOSIS — Z853 Personal history of malignant neoplasm of breast: Secondary | ICD-10-CM | POA: Diagnosis not present

## 2021-01-24 DIAGNOSIS — M150 Primary generalized (osteo)arthritis: Secondary | ICD-10-CM | POA: Diagnosis not present

## 2021-01-24 DIAGNOSIS — K449 Diaphragmatic hernia without obstruction or gangrene: Secondary | ICD-10-CM | POA: Diagnosis not present

## 2021-01-24 DIAGNOSIS — Z471 Aftercare following joint replacement surgery: Secondary | ICD-10-CM | POA: Diagnosis not present

## 2021-01-27 NOTE — Discharge Summary (Addendum)
Patient ID: MUNIBA CISNEY MRN: [id number] DOB/AGE: Oct 20, 1930 87 y.o.  Admit date: 01/14/2021 Discharge date: 01/15/2021  Admission Diagnoses:  Principal Problem:   Status post total hip replacement, right Active Problems:   Primary osteoarthritis of right hip   Discharge Diagnoses:  Same  Past Medical History:  Diagnosis Date  . Arthritis   . Breast cancer ([place]) 2017   left breast  . Colon cancer ([place]) 1994  . History of radiation therapy 06/16/16- 07/13/16   Left Breast  . Hx of adenomatous colonic polyps   . Personal history of radiation therapy   . PONV (postoperative nausea and vomiting)    nausea only  . Varicose vein     Surgeries: Procedure(s): TOTAL HIP ARTHROPLASTY ANTERIOR APPROACH WITH LEFT KNEE CORTISONE INJECTION on 01/14/2021   Consultants:   Discharged Condition: Improved  Hospital Course: CHONTE WESSELL is an 87 y.o. female who was admitted 01/14/2021 for operative treatment ofStatus post total hip replacement, right. Patient has severe unremitting pain that affects sleep, daily activities, and work/hobbies. After pre-op clearance the patient was taken to the operating room on 01/14/2021 and underwent  Procedure(s): TOTAL HIP ARTHROPLASTY ANTERIOR APPROACH WITH LEFT KNEE CORTISONE INJECTION.    Patient was given perioperative antibiotics:  Anti-infectives (From admission, onward)   Start     Dose/Rate Route Frequency Ordered Stop   01/15/21 0130  vancomycin (VANCOREADY) IVPB 1000 mg/200 mL        1,000 mg 200 mL/hr over 60 Minutes Intravenous  Once 01/14/21 1834 01/15/21 0335   01/14/21 1230  vancomycin (VANCOCIN) IVPB 1000 mg/200 mL premix        1,000 mg 200 mL/hr over 60 Minutes Intravenous On call to O.R. 01/14/21 1222 01/14/21 1420       Patient was given sequential compression devices, early ambulation, and chemoprophylaxis to prevent DVT.  Patient benefited maximally from hospital stay and there were no complications.    Recent vital  signs: No data found.   Recent laboratory studies: No results for input(s): WBC, HGB, HCT, PLT, NA, K, CL, CO2, BUN, CREATININE, GLUCOSE, INR, CALCIUM in the last 72 hours.  Invalid input(s): PT, 2   Discharge Medications:   Allergies as of 01/15/2021      Reactions   Sotradecol [sodium Tetradecyl Sulfate] Anaphylaxis   Celebrex [celecoxib]    Clindamycin    Conjugated Estrogens    Estradiol Benzoate Other (See Comments)   Caused fast heart rate   Estrogens Conjugated    Hypaque-m [diatrizone Sodium-diatrizone Meglumine] Hives   Ct scan IVP dye   Penicillins Hives   Tramadol Nausea Only      Medication List    TAKE these medications   calcitonin (salmon) 200 UNIT/ACT nasal spray Commonly known as: MIACALCIN/FORTICAL Place 1 spray into alternate nostrils daily.   CALCIUM 600/VITAMIN D3 PO Take 2 tablets by mouth daily.   cetirizine 10 MG tablet Commonly known as: ZYRTEC Take 10 mg by mouth daily.   diphenhydramine-acetaminophen 25-500 MG Tabs tablet Commonly known as: TYLENOL PM Take 1 tablet by mouth at bedtime as needed (sleep).   Grape Seed Extract 100 MG Caps Take 400 each by mouth daily.   pramipexole 0.25 MG tablet Commonly known as: MIRAPEX Take 0.25 mg by mouth at bedtime.   Vitamin D3 50 MCG (2000 UT) Tabs Take 2,000 Units by mouth daily.            Durable Medical Equipment  (From admission, onward)  Start     Ordered   01/15/21 0000  For home use only DME 3 n 1        01/15/21 1601           Discharge Care Instructions  (From admission, onward)         Start     Ordered   01/15/21 0000  Leave dressing on - Keep it clean, dry, and intact until clinic visit        01/15/21 1553          Diagnostic Studies: DG Chest 2 View  Result Date: 01/09/2021 CLINICAL DATA:  Preoperative evaluation. EXAM: CHEST - 2 VIEW COMPARISON:  September 23, 2007 FINDINGS: The lungs are hyperinflated. Mild, chronic appearing increased interstitial  lung markings are seen. Mild, stable linear scarring and/or atelectasis is seen along the lateral aspect of the mid right lung. The heart size and mediastinal contours are within normal limits. Radiopaque surgical clips are seen overlying the left lung base. The visualized skeletal structures are unremarkable. IMPRESSION: Chronic appearing increased interstitial lung markings, without evidence of acute or active cardiopulmonary disease. Electronically Signed   By: Virgina Norfolk M.D.   On: 01/09/2021 17:21   US ARTERIAL ABI (SCREENING LOWER EXTREMITY)  Result Date: 01/09/2021 CLINICAL DATA:  Bilateral femoral bruits.  Preop, hip surgery EXAM: BILATERAL LOWER EXTREMITY ARTERIAL DUPLEX SCAN TECHNIQUE: Gray-scale sonography as well as color Doppler and duplex ultrasound was performed to evaluate the arteries of both lower extremities including the common, superficial and profunda femoral arteries, popliteal artery and calf arteries. COMPARISON:  None. FINDINGS: Right Lower Extremity ABI: 1.1 Inflow: Normal common femoral arterial waveforms and velocities. No evidence of inflow (aortoiliac) disease. Mild nonocclusive plaque in the common femoral artery. Outflow: Normal profunda femoral, superficial femoral and popliteal arterial waveforms and velocities. No focal elevation of the PSV to suggest stenosis. There is focal aliasing adjacent to the right deep femoral artery, and Doppler waveform shows monophasic flow signal suggesting AV fistula. Dedicated venous waveforms proximal and distal to this region were not obtained however. Runoff: Normal posterior and anterior tibial arterial waveforms and velocities. Vessels are patent to the ankle. Left Lower Extremity ABI: 1.1 Inflow: Normal common femoral arterial waveforms and velocities. No evidence of inflow (aortoiliac) disease. Mild eccentric nonocclusive plaque in the common femoral artery. Outflow: Normal profunda femoral, superficial femoral and popliteal  arterial waveforms and velocities. No focal elevation of the PSV to suggest stenosis. Focal aliasing demonstrated adjacent to the mid SFA, with monophasic waveform. Retrograde arterialized signal noted in an adjacent vessel suggesting AV fistula. Runoff: Normal posterior and anterior tibial arterial waveforms and velocities. Vessels are patent to the ankle. IMPRESSION: 1. No evidence of hemodynamically significant lower extremity arterial occlusive disease at rest. 2. Possible AV fistula involving right deep femoral artery. 3. Possible AV fistula involving mid left SFA. If clinically indicated, dedicated lower extremity venous ultrasound may be useful for confirmation. Electronically Signed   By: Lucrezia Europe M.D.   On: 01/09/2021 15:57   US ARTERIAL LOWER EXTREMITY DUPLEX BILATERAL  Result Date: 01/09/2021 CLINICAL DATA:  Bilateral femoral bruits.  Preop, hip surgery EXAM: BILATERAL LOWER EXTREMITY ARTERIAL DUPLEX SCAN TECHNIQUE: Gray-scale sonography as well as color Doppler and duplex ultrasound was performed to evaluate the arteries of both lower extremities including the common, superficial and profunda femoral arteries, popliteal artery and calf arteries. COMPARISON:  None. FINDINGS: Right Lower Extremity ABI: 1.1 Inflow: Normal common femoral arterial waveforms and velocities. No evidence  of inflow (aortoiliac) disease. Mild nonocclusive plaque in the common femoral artery. Outflow: Normal profunda femoral, superficial femoral and popliteal arterial waveforms and velocities. No focal elevation of the PSV to suggest stenosis. There is focal aliasing adjacent to the right deep femoral artery, and Doppler waveform shows monophasic flow signal suggesting AV fistula. Dedicated venous waveforms proximal and distal to this region were not obtained however. Runoff: Normal posterior and anterior tibial arterial waveforms and velocities. Vessels are patent to the ankle. Left Lower Extremity ABI: 1.1 Inflow: Normal  common femoral arterial waveforms and velocities. No evidence of inflow (aortoiliac) disease. Mild eccentric nonocclusive plaque in the common femoral artery. Outflow: Normal profunda femoral, superficial femoral and popliteal arterial waveforms and velocities. No focal elevation of the PSV to suggest stenosis. Focal aliasing demonstrated adjacent to the mid SFA, with monophasic waveform. Retrograde arterialized signal noted in an adjacent vessel suggesting AV fistula. Runoff: Normal posterior and anterior tibial arterial waveforms and velocities. Vessels are patent to the ankle. IMPRESSION: 1. No evidence of hemodynamically significant lower extremity arterial occlusive disease at rest. 2. Possible AV fistula involving right deep femoral artery. 3. Possible AV fistula involving mid left SFA. If clinically indicated, dedicated lower extremity venous ultrasound may be useful for confirmation. Electronically Signed   By: Lucrezia Europe M.D.   On: 01/09/2021 15:57   DG C-Arm 1-60 Min-No Report  Result Date: 01/14/2021 CLINICAL DATA:  Right hip arthroplasty. EXAM: OPERATIVE RIGHT HIP (WITH PELVIS IF PERFORMED) TECHNIQUE: Fluoroscopic spot image(s) were submitted for interpretation post-operatively. COMPARISON:  None. FINDINGS: Three fluoroscopic spot views of the pelvis or right hip obtained in the operating room. Interval right hip arthroplasty. Fluoroscopy time 20 seconds. Dose 2.1442 mGy. IMPRESSION: Procedural fluoroscopy for right hip arthroplasty. Electronically Signed   By: Keith Rake M.D.   On: 01/14/2021 17:41   DG HIP OPERATIVE UNILAT W OR W/O PELVIS RIGHT  Result Date: 01/14/2021 CLINICAL DATA:  Right hip arthroplasty. EXAM: OPERATIVE RIGHT HIP (WITH PELVIS IF PERFORMED) TECHNIQUE: Fluoroscopic spot image(s) were submitted for interpretation post-operatively. COMPARISON:  None. FINDINGS: Three fluoroscopic spot views of the pelvis or right hip obtained in the operating room. Interval right hip  arthroplasty. Fluoroscopy time 20 seconds. Dose 2.1442 mGy. IMPRESSION: Procedural fluoroscopy for right hip arthroplasty. Electronically Signed   By: Keith Rake M.D.   On: 01/14/2021 17:41    Disposition: Discharge disposition: 01-Home or Self Care       Discharge Instructions    Call MD for:  difficulty breathing, headache or visual disturbances   Complete by: As directed    Call MD for:  persistant nausea and vomiting   Complete by: As directed    Call MD for:  severe uncontrolled pain   Complete by: As directed    Call MD for:  temperature >100.4   Complete by: As directed    Diet - low sodium heart healthy   Complete by: As directed    Discharge instructions   Complete by: As directed    INSTRUCTIONS AFTER JOINT REPLACEMENT   Remove items at home which could result in a fall. This includes throw rugs or furniture in walking pathways ICE to the affected joint every three hours while awake for 30 minutes at a time, for at least the first 3-5 days, and then as needed for pain and swelling.  Continue to use ice for pain and swelling. You may notice swelling that will progress down to the foot and ankle.  This is normal  after surgery.  Elevate your leg when you are not up walking on it.   Continue to use the breathing machine you got in the hospital (incentive spirometer) which will help keep your temperature down.  It is common for your temperature to cycle up and down following surgery, especially at night when you are not up moving around and exerting yourself.  The breathing machine keeps your lungs expanded and your temperature down.   DIET:  As you were doing prior to hospitalization, we recommend a well-balanced diet.  DRESSING / WOUND CARE / SHOWERING  Keep the surgical dressing until follow up.  The dressing is water proof, so you can shower without any extra covering.  IF THE DRESSING FALLS OFF or the wound gets wet inside, change the dressing with sterile gauze.   Please use good hand washing techniques before changing the dressing.  Do not use any lotions or creams on the incision until instructed by your surgeon.    ACTIVITY  Increase activity slowly as tolerated, but follow the weight bearing instructions below.   No driving for 6 weeks or until further direction given by your physician.  You cannot drive while taking narcotics.  No lifting or carrying greater than 10 lbs. until further directed by your surgeon. Avoid periods of inactivity such as sitting longer than an hour when not asleep. This helps prevent blood clots.  You may return to work once you are authorized by your doctor.     WEIGHT BEARING   Weight bearing as tolerated with assist device (walker, cane, etc) as directed, use it as long as suggested by your surgeon or therapist, typically at least 4-6 weeks.   EXERCISES  Results after joint replacement surgery are often greatly improved when you follow the exercise, range of motion and muscle strengthening exercises prescribed by your doctor. Safety measures are also important to protect the joint from further injury. Any time any of these exercises cause you to have increased pain or swelling, decrease what you are doing until you are comfortable again and then slowly increase them. If you have problems or questions, call your caregiver or physical therapist for advice.   Rehabilitation is important following a joint replacement. After just a few days of immobilization, the muscles of the leg can become weakened and shrink (atrophy).  These exercises are designed to build up the tone and strength of the thigh and leg muscles and to improve motion. Often times heat used for twenty to thirty minutes before working out will loosen up your tissues and help with improving the range of motion but do not use heat for the first two weeks following surgery (sometimes heat can increase post-operative swelling).   These exercises can be done on a  training (exercise) mat, on the floor, on a table or on a bed. Use whatever works the best and is most comfortable for you.    Use music or television while you are exercising so that the exercises are a pleasant break in your day. This will make your life better with the exercises acting as a break in your routine that you can look forward to.   Perform all exercises about fifteen times, three times per day or as directed.  You should exercise both the operative leg and the other leg as well.  Exercises include:   Quad Sets - Tighten up the muscle on the front of the thigh (Quad) and hold for 5-10 seconds.   Straight Leg Raises -  With your knee straight (if you were given a brace, keep it on), lift the leg to 60 degrees, hold for 3 seconds, and slowly lower the leg.  Perform this exercise against resistance later as your leg gets stronger.  Leg Slides: Lying on your back, slowly slide your foot toward your buttocks, bending your knee up off the floor (only go as far as is comfortable). Then slowly slide your foot back down until your leg is flat on the floor again.  Angel Wings: Lying on your back spread your legs to the side as far apart as you can without causing discomfort.  Hamstring Strength:  Lying on your back, push your heel against the floor with your leg straight by tightening up the muscles of your buttocks.  Repeat, but this time bend your knee to a comfortable angle, and push your heel against the floor.  You may put a pillow under the heel to make it more comfortable if necessary.   A rehabilitation program following joint replacement surgery can speed recovery and prevent re-injury in the future due to weakened muscles. Contact your doctor or a physical therapist for more information on knee rehabilitation.    CONSTIPATION  Constipation is defined medically as fewer than three stools per week and severe constipation as less than one stool per week.  Even if you have a regular bowel  pattern at home, your normal regimen is likely to be disrupted due to multiple reasons following surgery.  Combination of anesthesia, postoperative narcotics, change in appetite and fluid intake all can affect your bowels.   YOU MUST use at least one of the following options; they are listed in order of increasing strength to get the job done.  They are all available over the counter, and you may need to use some, POSSIBLY even all of these options:    Drink plenty of fluids (prune juice may be helpful) and high fiber foods Colace 100 mg by mouth twice a day  Senokot for constipation as directed and as needed Dulcolax (bisacodyl), take with full glass of water  Miralax (polyethylene glycol) once or twice a day as needed.  If you have tried all these things and are unable to have a bowel movement in the first 3-4 days after surgery call either your surgeon or your primary doctor.    If you experience loose stools or diarrhea, hold the medications until you stool forms back up.  If your symptoms do not get better within 1 week or if they get worse, check with your doctor.  If you experience "the worst abdominal pain ever" or develop nausea or vomiting, please contact the office immediately for further recommendations for treatment.   ITCHING:  If you experience itching with your medications, try taking only a single pain pill, or even half a pain pill at a time.  You can also use Benadryl over the counter for itching or also to help with sleep.   TED HOSE STOCKINGS:  Use stockings on both legs until for at least 2 weeks or as directed by physician office. They may be removed at night for sleeping.  MEDICATIONS:  See your medication summary on the "After Visit Summary" that nursing will review with you.  You may have some home medications which will be placed on hold until you complete the course of blood thinner medication.  It is important for you to complete the blood thinner medication as  prescribed.  PRECAUTIONS:  If you experience chest  pain or shortness of breath - call 911 immediately for transfer to the hospital emergency department.   If you develop a fever greater that 101 F, purulent drainage from wound, increased redness or drainage from wound, foul odor from the wound/dressing, or calf pain - CONTACT YOUR SURGEON.                                                   FOLLOW-UP APPOINTMENTS:  If you do not already have a post-op appointment, please call the office for an appointment to be seen by your surgeon.  Guidelines for how soon to be seen are listed in your "After Visit Summary", but are typically between 1-4 weeks after surgery.  OTHER INSTRUCTIONS:   Knee Replacement:  Do not place pillow under knee, focus on keeping the knee straight while resting. CPM instructions: 0-90 degrees, 2 hours in the morning, 2 hours in the afternoon, and 2 hours in the evening. Place foam block, curve side up under heel at all times except when in CPM or when walking.  DO NOT modify, tear, cut, or change the foam block in any way.  POST-OPERATIVE OPIOID TAPER INSTRUCTIONS: It is important to wean off of your opioid medication as soon as possible. If you do not need pain medication after your surgery it is ok to stop day one. Opioids include: Codeine, Hydrocodone(Norco, Vicodin), Oxycodone(Percocet, oxycontin) and hydromorphone amongst others.  Long term and even short term use of opiods can cause: Increased pain response Dependence Constipation Depression Respiratory depression And more.  Withdrawal symptoms can include Flu like symptoms Nausea, vomiting And more Techniques to manage these symptoms Hydrate well Eat regular healthy meals Stay active Use relaxation techniques(deep breathing, meditating, yoga) Do Not substitute Alcohol to help with tapering If you have been on opioids for less than two weeks and do not have pain than it is ok to stop all together.  Plan to  wean off of opioids This plan should start within one week post op of your joint replacement. Maintain the same interval or time between taking each dose and first decrease the dose.  Cut the total daily intake of opioids by one tablet each day Next start to increase the time between doses. The last dose that should be eliminated is the evening dose.     MAKE SURE YOU:  Understand these instructions.  Get help right away if you are not doing well or get worse.    Thank you for letting us be a part of your medical care team.  It is a privilege we respect greatly.  We hope these instructions will help you stay on track for a fast and full recovery!   For home use only DME 3 n 1   Complete by: As directed    Increase activity slowly   Complete by: As directed    Leave dressing on - Keep it clean, dry, and intact until clinic visit   Complete by: As directed        Follow-up Information    [name], MD. Go on 01/28/2021.   Specialty: Orthopedic Surgery Why: Your appointment is scheduled for 9:45  Contact information: [place] [address] [phone]        [facility] [place] Follow up.   Specialty: Home Health Services Why: HHPT will provide 5  home visits prior to starting outpatient physical therapy  Contact information: [address] [place] [address] [phone]        [facility], [place]. Go on 01/28/2021.   Why: Your appointment has been scheduled for 11:20  Contact information: [facility] [place] [address] [phone]                Signed: Dereck Leep 01/27/2021, 1:52 PM

## 2021-01-28 DIAGNOSIS — Z9889 Other specified postprocedural states: Secondary | ICD-10-CM | POA: Diagnosis not present

## 2021-03-26 ENCOUNTER — Other Ambulatory Visit: Payer: Medicare Other

## 2021-07-09 ENCOUNTER — Encounter: Payer: Self-pay | Admitting: Oncology

## 2021-07-10 ENCOUNTER — Encounter: Payer: Self-pay | Admitting: Oncology

## 2021-07-10 ENCOUNTER — Ambulatory Visit (HOSPITAL_COMMUNITY)
Admission: RE | Admit: 2021-07-10 | Discharge: 2021-07-10 | Disposition: A | Discharge status: Home / Self Care | Payer: Medicare Other | Source: Ambulatory Visit | Attending: Vascular Surgery | Admitting: Vascular Surgery

## 2021-07-10 ENCOUNTER — Other Ambulatory Visit: Payer: Self-pay

## 2021-07-10 ENCOUNTER — Ambulatory Visit (INDEPENDENT_AMBULATORY_CARE_PROVIDER_SITE_OTHER): Payer: Medicare Other | Admitting: Vascular Surgery

## 2021-07-10 ENCOUNTER — Encounter: Payer: Self-pay | Admitting: Vascular Surgery

## 2021-07-10 ENCOUNTER — Ambulatory Visit (INDEPENDENT_AMBULATORY_CARE_PROVIDER_SITE_OTHER)
Admission: RE | Admit: 2021-07-10 | Discharge: 2021-07-10 | Disposition: A | Discharge status: Home / Self Care | Payer: Medicare Other | Source: Ambulatory Visit | Attending: Vascular Surgery | Admitting: Vascular Surgery

## 2021-07-10 ENCOUNTER — Other Ambulatory Visit: Payer: Self-pay | Admitting: *Deleted

## 2021-07-10 ENCOUNTER — Encounter (HOSPITAL_COMMUNITY): Payer: Self-pay

## 2021-07-10 VITALS — BP 149/82 | HR 71 | Temp 97.7°F | Resp 20 | Ht 62.0 in | Wt 136.0 lb

## 2021-07-10 DIAGNOSIS — R0989 Other specified symptoms and signs involving the circulatory and respiratory systems: Secondary | ICD-10-CM | POA: Insufficient documentation

## 2021-07-10 NOTE — Progress Notes (Signed)
ASSESSMENT & PLAN   BILATERAL FEMORAL BRUITS: This patient was noted to have some mild eccentric plaque in both common femoral arteries on duplex back in April.  I suspect this is the reason that femoral bruits were previously appreciated.  Although there was some mention of possible AV fistulas I do not hear any significant bruits at this point and the patient has had no previous trauma or procedures that would put her at risk for AV fistula.  She has palpable pedal pulses and a normal noninvasive study including normal toe pressures.  Thus I do not think any further work-up is indicated.  I be happy to see her back at any time if any new vascular issues arise.  REASON FOR CONSULT:    Femoral bruit.  The consult is requested by Eldridge Abrahams, FNP  HPI:   Teresa Boyd is a 87 y.o. female who was referred with a femoral bruit.  I reviewed the records from the referring office.  The patient was seen on 06/23/2021.  According to the records, the patient was found to have a femoral bruit and was set up for vascular consultation.  Of note the patient underwent a right hip replacement in April.  In reviewing the records, it looks like during her preoperative work-up prior to hip surgery the patient was found to have bilateral femoral bruits.  I located a duplex scan that was done on 01/09/2021.  This showed no evidence of hemodynamically significant lower extremity arterial occlusive disease bilaterally.  There was possibly an AV fistula involving the right deep femoral artery and possibly an AV fistula involving the mid left superficial femoral artery.  On my history, the patient denies any history of claudication, rest pain, or nonhealing ulcers.  She denies any previous history of trauma or interventions in either lower extremity which might be a cause for AV fistula.  She is had no significant leg swelling.  She has no previous history of DVT.  She did very well after her hip replacement.  Past  Medical History:  Diagnosis Date   Arthritis    Breast cancer ([place]) 2017   left breast   Colon cancer ([place]) 1994   History of radiation therapy 06/16/16- 07/13/16   Left Breast   Hx of adenomatous colonic polyps    Personal history of radiation therapy    PONV (postoperative nausea and vomiting)    nausea only   Varicose vein     Family History  Problem Relation Age of Onset   Colon cancer Mother 68   Colon polyps Father 1   Pancreatic cancer Neg Hx    Rectal cancer Neg Hx    Stomach cancer Neg Hx     SOCIAL HISTORY: Social History   Tobacco Use   Smoking status: Never   Smokeless tobacco: Never  Substance Use Topics   Alcohol use: No    Allergies  Allergen Reactions   Sotradecol [Sodium Tetradecyl Sulfate] Anaphylaxis   Celebrex [Celecoxib]    Clindamycin    Conjugated Estrogens    Estradiol Benzoate Other (See Comments)    Caused fast heart rate   Estrogens Conjugated    Hypaque-M [Diatrizone Sodium-Diatrizone Meglumine] Hives    Ct scan IVP dye   Penicillins Hives   Tramadol Nausea Only    Current Outpatient Medications  Medication Sig Dispense Refill   calcitonin, salmon, (MIACALCIN/FORTICAL) 200 UNIT/ACT nasal spray Place 1 spray into alternate nostrils daily.     Calcium Carb-Cholecalciferol (CALCIUM 600/VITAMIN  D3 PO) Take 2 tablets by mouth daily.     cetirizine (ZYRTEC) 10 MG tablet Take 10 mg by mouth daily.     Cholecalciferol (VITAMIN D3) 50 MCG (2000 UT) TABS Take 2,000 Units by mouth daily.     diphenhydramine-acetaminophen (TYLENOL PM) 25-500 MG TABS tablet Take 1 tablet by mouth at bedtime as needed (sleep).     Grape Seed Extract 100 MG CAPS Take 400 each by mouth daily.     pramipexole (MIRAPEX) 0.25 MG tablet Take 0.25 mg by mouth at bedtime.     No current facility-administered medications for this visit.    REVIEW OF SYSTEMS:  [X]  denotes positive finding, [ ]  denotes negative finding Cardiac  Comments:  Chest pain or chest  pressure:    Shortness of breath upon exertion:    Short of breath when lying flat:    Irregular heart rhythm:        Vascular    Pain in calf, thigh, or hip brought on by ambulation:    Pain in feet at night that wakes you up from your sleep:     Blood clot in your veins:    Leg swelling:         Pulmonary    Oxygen at home:    Productive cough:     Wheezing:         Neurologic    Sudden weakness in arms or legs:     Sudden numbness in arms or legs:     Sudden onset of difficulty speaking or slurred speech:    Temporary loss of vision in one eye:     Problems with dizziness:         Gastrointestinal    Blood in stool:     Vomited blood:         Genitourinary    Burning when urinating:     Blood in urine:        Psychiatric    Major depression:         Hematologic    Bleeding problems:    Problems with blood clotting too easily:        Skin    Rashes or ulcers:        Constitutional    Fever or chills:    -  PHYSICAL EXAM:   Vitals:   07/10/21 1349  BP: (!) 149/82  Pulse: 71  Resp: 20  Temp: 97.7 F (36.5 C)  SpO2: 96%  Weight: 136 lb (61.7 kg)  Height: 5\' 2"  (1.575 m)   Body mass index is 24.87 kg/m. GENERAL: The patient is a well-nourished female, in no acute distress. The vital signs are documented above. CARDIAC: There is a regular rate and rhythm.  VASCULAR: I do not detect carotid bruits. She has palpable femoral and pedal pulses bilaterally. Currently I do not appreciate any femoral bruits. She has no significant lower extremity swelling. PULMONARY: There is good air exchange bilaterally without wheezing or rales. ABDOMEN: Soft and non-tender with normal pitched bowel sounds.  MUSCULOSKELETAL: There are no major deformities. NEUROLOGIC: No focal weakness or paresthesias are detected. SKIN: There are no ulcers or rashes noted. PSYCHIATRIC: The patient has a normal affect.  DATA:    DUPLEX BILATERAL LOWER EXTREMITIES: I reviewed the  duplex that was done on 01/09/2021.  The indication was bilateral femoral bruits.  On the right side ABI was 100%.  There was mild nonocclusive plaque in the common femoral artery noted.  There  was some suggestion of possibly an AV fistula adjacent to the deep femoral artery.  On the left side ABI was 100%.  There was mild eccentric nonocclusive plaque in the common femoral artery.  There was possibly a suggestion of an AV fistula in the superficial femoral artery.  Deitra Mayo [facility]

## 2021-07-24 ENCOUNTER — Encounter: Payer: Self-pay | Admitting: Oncology

## 2021-07-30 ENCOUNTER — Other Ambulatory Visit: Payer: Self-pay

## 2021-07-30 ENCOUNTER — Ambulatory Visit (INDEPENDENT_AMBULATORY_CARE_PROVIDER_SITE_OTHER): Payer: Medicare Other | Admitting: Podiatry

## 2021-07-30 DIAGNOSIS — M79674 Pain in right toe(s): Secondary | ICD-10-CM | POA: Diagnosis not present

## 2021-07-30 DIAGNOSIS — M79675 Pain in left toe(s): Secondary | ICD-10-CM | POA: Diagnosis not present

## 2021-07-30 DIAGNOSIS — B351 Tinea unguium: Secondary | ICD-10-CM

## 2021-07-30 DIAGNOSIS — L989 Disorder of the skin and subcutaneous tissue, unspecified: Secondary | ICD-10-CM | POA: Diagnosis not present

## 2021-07-30 NOTE — Progress Notes (Signed)
    Subjective: Patient is a 87 y.o. female presenting to the office today with a chief complaint of painful callus lesion(s) noted to the right first and second digit that has been present for few weeks.  She has been applying [employer] with no improvement. Patient also complains of elongated, thickened nails that cause pain while ambulating in shoes.  She is unable to trim her own nails. Patient presents today for further treatment and evaluation.  Past Medical History:  Diagnosis Date   Arthritis    Breast cancer ([place]) 2017   left breast   Colon cancer ([place]) 1994   History of radiation therapy 06/16/16- 07/13/16   Left Breast   Hx of adenomatous colonic polyps    Personal history of radiation therapy    PONV (postoperative nausea and vomiting)    nausea only   Varicose vein     Objective:  Physical Exam General: Alert and oriented x3 in no acute distress  Dermatology: Hyperkeratotic lesion(s) present on the first interdigital webspace on the great toe and adjacent second toe. Pain on palpation with a central nucleated core noted. Skin is warm, dry and supple bilateral lower extremities. Negative for open lesions or macerations. Nails are tender, long, thickened and dystrophic with subungual debris, consistent with onychomycosis, 1-5 bilateral. No signs of infection noted.  Vascular: Palpable pedal pulses bilaterally. No edema or erythema noted. Capillary refill within normal limits.  Neurological: Epicritic and protective threshold grossly intact bilaterally.   Musculoskeletal Exam: Pain on palpation at the keratotic lesion(s) noted. Range of motion within normal limits bilateral. Muscle strength 5/5 in all groups bilateral.  Assessment: 1. Onychodystrophic nails 1-5 bilateral with hyperkeratosis of nails.  2. Onychomycosis of nail due to dermatophyte bilateral 3.  Preulcerative callus lesion to the right great toe and second toe   Plan of Care:  1. Patient  evaluated. 2. Excisional debridement of keratoic lesion(s) using a chisel blade was performed without incident.  3.  The patient would like to know if there is any treatment for the onychomycosis of the toenails.  Recommend topical.  Formula 3 topical antifungal was provided for the patient to apply daily 4. Mechanical debridement of nails 1-5 bilaterally performed using a nail nipper. Filed with dremel without incident.  5. Patient is to return to the clinic as needed  Edrick Kins, DPM [facility]  Dr. Edrick Kins, DPM    [address], [place] [address]                Office [phone]  Fax [phone]

## 2021-09-08 ENCOUNTER — Ambulatory Visit
Admission: RE | Admit: 2021-09-08 | Discharge: 2021-09-08 | Disposition: A | Discharge status: Home / Self Care | Payer: Medicare Other | Source: Ambulatory Visit | Attending: Oncology | Admitting: Oncology

## 2021-09-08 ENCOUNTER — Encounter: Payer: Self-pay | Admitting: Oncology

## 2021-09-08 DIAGNOSIS — M818 Other osteoporosis without current pathological fracture: Secondary | ICD-10-CM

## 2021-09-08 DIAGNOSIS — Z170 Estrogen receptor positive status [ER+]: Secondary | ICD-10-CM

## 2021-11-04 ENCOUNTER — Other Ambulatory Visit: Payer: Self-pay | Admitting: Nurse Practitioner

## 2021-11-04 DIAGNOSIS — Z1231 Encounter for screening mammogram for malignant neoplasm of breast: Secondary | ICD-10-CM

## 2021-12-12 ENCOUNTER — Ambulatory Visit
Admission: RE | Admit: 2021-12-12 | Discharge: 2021-12-12 | Disposition: A | Discharge status: Home / Self Care | Payer: Medicare Other | Source: Ambulatory Visit | Attending: Nurse Practitioner | Admitting: Nurse Practitioner

## 2021-12-12 DIAGNOSIS — Z1231 Encounter for screening mammogram for malignant neoplasm of breast: Secondary | ICD-10-CM

## 2022-06-06 IMAGING — RF DG HIP (WITH PELVIS) OPERATIVE*R*
1 series · 3 of 3 positions shown · non-contrast
Comparison: None.

CLINICAL DATA: Right hip arthroplasty.

EXAM:
OPERATIVE RIGHT HIP (WITH PELVIS IF PERFORMED)
TECHNIQUE: Fluoroscopic spot image(s) were submitted for interpretation
post-operatively.

[Series 1: unknown protocol · 0.20mm/px · 3 of 3 slices shown]
[im 1/3]
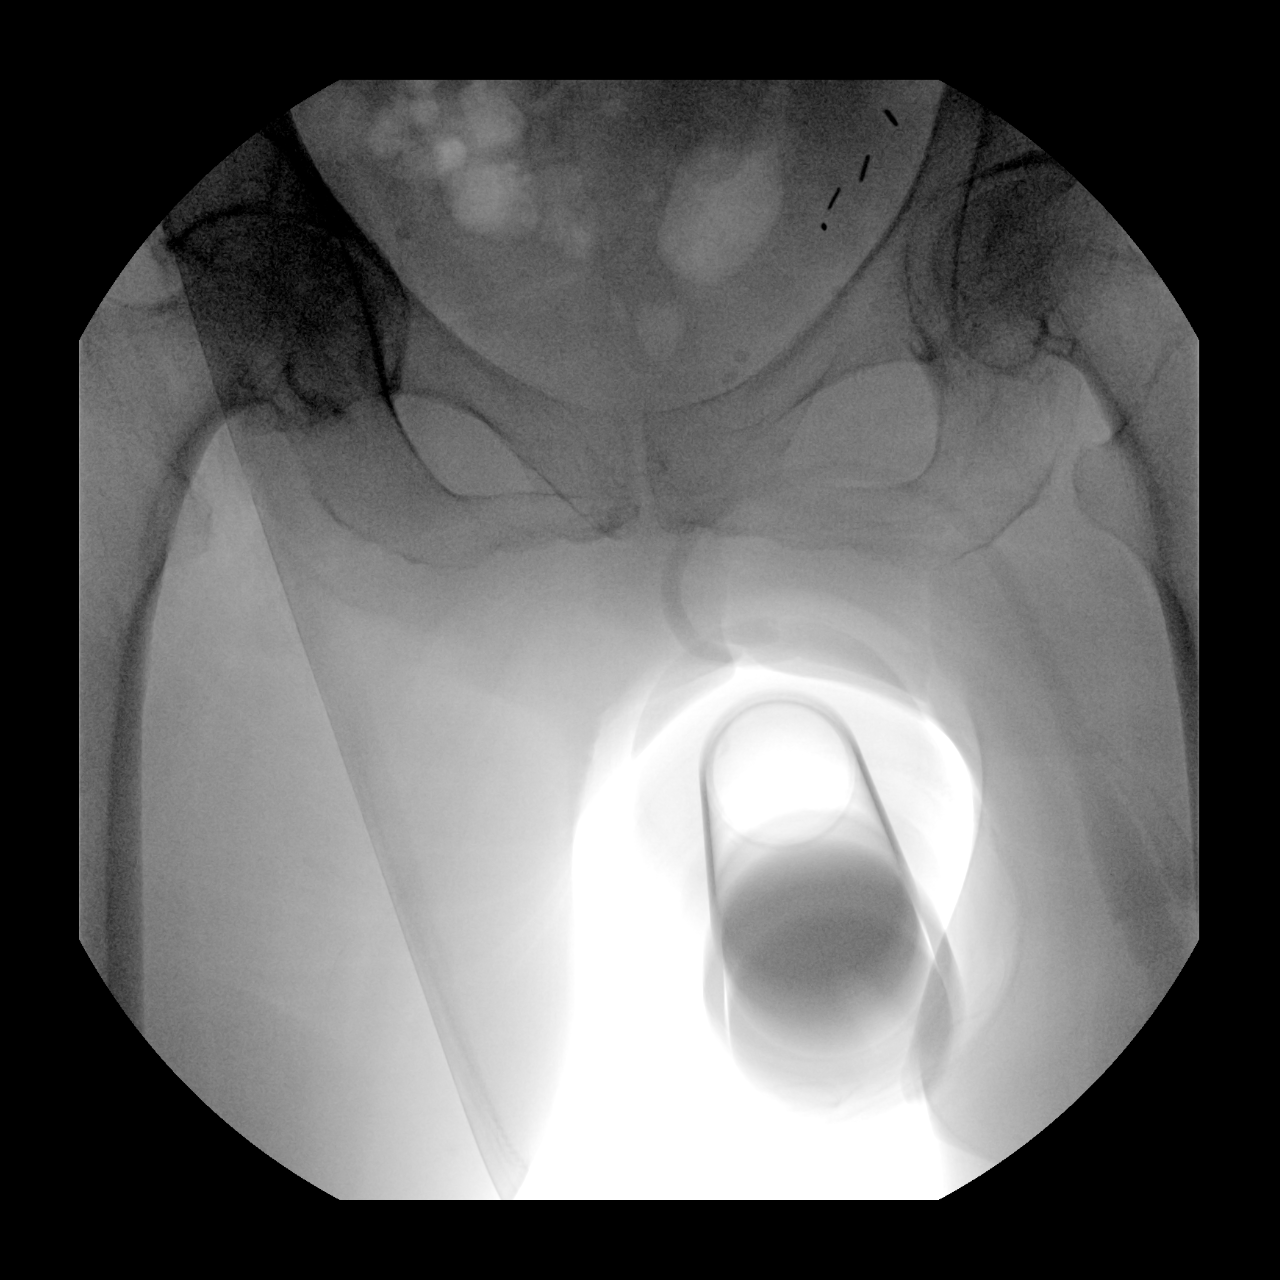
[im 2/3]
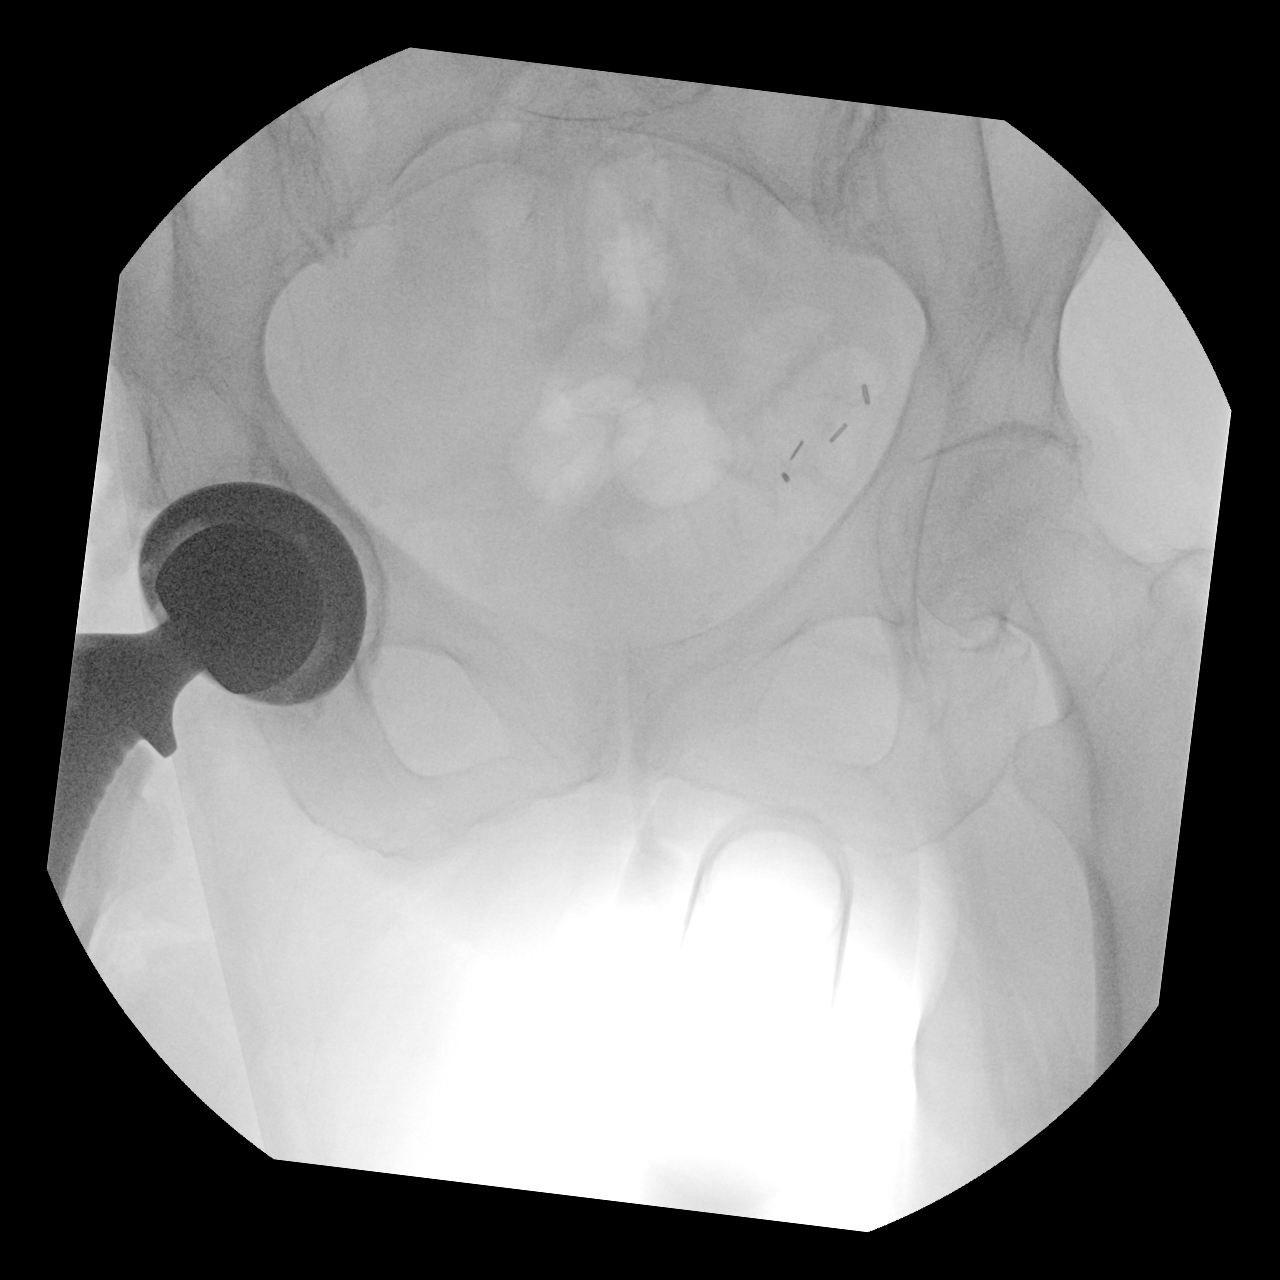
[im 3/3]
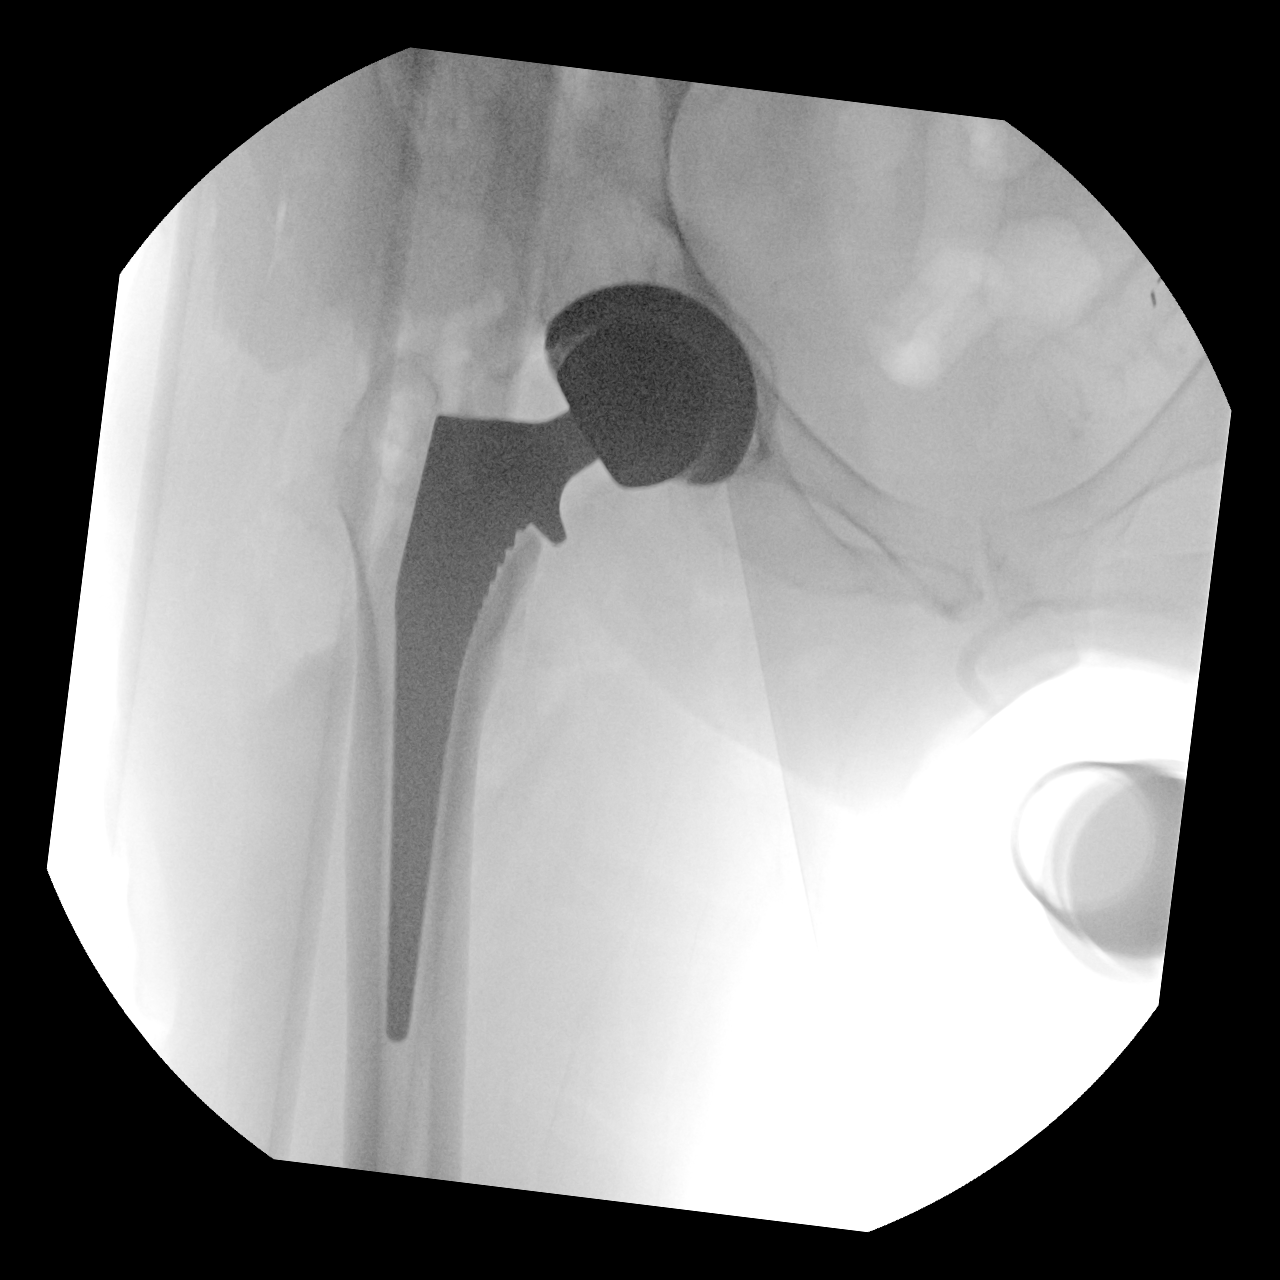

[3 of 3 positions shown; findings below may reference images not displayed]

FINDINGS: Three fluoroscopic spot views of the pelvis or right hip obtained in
the operating room. Interval right hip arthroplasty. Fluoroscopy
time 20 seconds. Dose 2.1442 mGy.
IMPRESSION: Procedural fluoroscopy for right hip arthroplasty.

## 2022-08-05 ENCOUNTER — Encounter: Payer: Self-pay | Admitting: Physical Therapy

## 2022-08-05 ENCOUNTER — Ambulatory Visit: Payer: Medicare Other | Attending: Nurse Practitioner | Admitting: Physical Therapy

## 2022-08-05 DIAGNOSIS — R2681 Unsteadiness on feet: Secondary | ICD-10-CM | POA: Diagnosis not present

## 2022-08-05 DIAGNOSIS — R262 Difficulty in walking, not elsewhere classified: Secondary | ICD-10-CM | POA: Diagnosis present

## 2022-08-05 NOTE — Therapy (Signed)
OUTPATIENT PHYSICAL THERAPY LOWER EXTREMITY EVALUATION   Patient Name: Teresa Boyd MRN: [id number] DOB:May 22, 1931, 88 y.o., female Today's Date: 08/05/2022   PT End of Session - 08/05/22 1011     Visit Number 1    Date for PT Re-Evaluation 10/28/22    Authorization Type [employer] [facility]    PT Start Time 1010    PT Stop Time 1100    PT Time Calculation (min) 50 min    Activity Tolerance Patient tolerated treatment well    Behavior During Therapy [facility] for tasks assessed/performed             Past Medical History:  Diagnosis Date   Arthritis    Breast cancer ([place]) 2017   left breast   Colon cancer ([place]) 1994   History of radiation therapy 06/16/16- 07/13/16   Left Breast   Hx of adenomatous colonic polyps    Personal history of radiation therapy    PONV (postoperative nausea and vomiting)    nausea only   Varicose vein    Past Surgical History:  Procedure Laterality Date   ABDOMINAL HYSTERECTOMY     BREAST BIOPSY Right    BREAST BIOPSY Right    BREAST LUMPECTOMY Left 2017   COLON RESECTION     sigmoid   COLON SURGERY  1994   colon cancer   COLONOSCOPY W/ POLYPECTOMY  last 02/18/11   multiple, prior colon cancer and polyps, 7 mm adenoma 2012   OVARIAN CYST SURGERY     PARTIAL MASTECTOMY WITH NEEDLE LOCALIZATION Left 04/22/2016   Procedure: LEFT PARTIAL MASTECTOMY WITH DOUBLE NEEDLE LOCALIZATION REEXCISION INFERIOR AND LATERAL MARGIN ADJACENT TISSUE TRANSFER;  Surgeon: [name], MD;  Location: [place];  Service: General;  Laterality: Left;   TONSILLECTOMY     TOTAL HIP ARTHROPLASTY Bilateral 01/14/2021   Procedure: TOTAL HIP ARTHROPLASTY ANTERIOR APPROACH WITH LEFT KNEE CORTISONE INJECTION;  Surgeon: [name], MD;  Location: [facility];  Service: Orthopedics;  Laterality: Bilateral;   Patient Active Problem List   Diagnosis Date Noted   Primary osteoarthritis of right hip 01/14/2021   Status post total hip replacement, right 01/14/2021   Malignant  neoplasm of upper-outer quadrant of left breast in female, estrogen receptor positive ([place]) 03/27/2016   Increased frequency of urination 01/31/2014   Allergic rhinitis due to pollen 12/30/2011   Hx of herpes zoster 12/30/2011   Leukocytopenia 12/30/2011   Malignant neoplasm of colon ([place]) 12/30/2011   Symptoms involving cardiovascular system 40/98/1191   Uncomplicated varicose veins 12/30/2011   Vitamin D deficiency 12/30/2011   HEMORRHOIDS-EXTERNAL 11/08/2008   HIATAL HERNIA 02/16/2007   Osteoporosis 02/16/2007   History of malignant neoplasm of large intestine 02/16/2007   Hiatal hernia 02/16/2007    PCP: Eldridge Abrahams  REFERRING PROVIDER: Eldridge Abrahams  REFERRING DIAG: falls, gait instability  THERAPY DIAG:  Unsteadiness on feet  Difficulty in walking, not elsewhere classified  Rationale for Evaluation and Treatment: Rehabilitation  ONSET DATE: 07/15/22  SUBJECTIVE:   SUBJECTIVE STATEMENT: Patient reports that she has had some unsteady gait, and recently saw her primary care and felt she has difficulty with balance  PERTINENT HISTORY: See above PAIN:  Are you having pain? No  PRECAUTIONS: None and Other:    WEIGHT BEARING RESTRICTIONS: No  FALLS:  Has patient fallen in last 6 months? No and has had some times off balance  LIVING ENVIRONMENT: Lives with: lives alone Lives in: House/apartment Stairs: No Has following equipment at home: None  OCCUPATION:  retired  PLOF: Independent and does her own housework and yardwork  PATIENT GOALS: better balance, get up easier from chair  NEXT MD VISIT:   OBJECTIVE:   COGNITION: Overall cognitive status: Within functional limits for tasks assessed     SENSATION: WFL POSTURE: rounded shoulders, forward head, decreased lumbar lordosis, and increased thoracic kyphosis  PALPATION: Non tender, fair mm tone  LOWER EXTREMITY ROM:  WFL, some crepitus in the left knee   LOWER EXTREMITY MMT:  MMT Right eval  Left eval  Hip flexion 4- 4-  Hip extension    Hip abduction    Hip adduction    Hip internal rotation    Hip external rotation    Knee flexion 4- 4-  Knee extension 4- 4-  Ankle dorsiflexion 4- 4-  Ankle plantarflexion    Ankle inversion    Ankle eversion     (Blank rows = not tested)  FUNCTIONAL TESTS:  5 times sit to stand: 24 Timed up and go (TUG): 16 off balance when first few steps  BERG 38/56  3 years ago she scored 49/56 GAIT: Distance walked: 100' Assistive device utilized: None Level of assistance: Complete Independence Comments: a little unsteady as she starts sometimes off to the side but mostly can correct herself   TODAY'S TREATMENT:                                                                                                                              DATE:     PATIENT EDUCATION:  Education details: HEP for start of balance, 3 way kick, march and one legged stance with support Person educated: Patient Education method: Consulting civil engineer, Demonstration, Verbal cues, and Handouts Education comprehension: verbalized understanding  HOME EXERCISE PROGRAM: As above  ASSESSMENT:  CLINICAL IMPRESSION: Patient is a 88 y.o. female who was seen today for physical therapy evaluation and treatment for balance and unsteady gait.  TUG was 16 seconds, unsteady when she gets up and the first few steps, has some LE weakness.  Has some crepitus in the left knee, Merrilee Jansky was 38/56, 3 years ago it was 49/56.  She has not had any falls as of now, reports that she would like to be able to get out of her chair without hands   OBJECTIVE IMPAIRMENTS: Abnormal gait, cardiopulmonary status limiting activity, decreased activity tolerance, decreased balance, decreased mobility, difficulty walking, decreased ROM, and decreased strength.   REHAB POTENTIAL: Good  CLINICAL DECISION MAKING: Stable/uncomplicated  EVALUATION COMPLEXITY: Low   GOALS: Goals reviewed with patient?  Yes  SHORT TERM GOALS: Target date:08/19/22 Independent with initial HEP Goal status: INITIAL  LONG TERM GOALS: Target date: 10/28/22  Decrease TUG to 12 seconds Goal status: INITIAL  2.  Increase Berg balance score to 46/56 Goal status: INITIAL  3.  Get up from sitting without hands Goal status: INITIAL  4.  Decrease 5XSTS to 19 seconds Goal status: INITIAL  5.  Independent with advanced  HEP Baseline:  Goal status: INITIAL  PLAN:  PT FREQUENCY: 1-2x/week  PT DURATION: 12 weeks  PLANNED INTERVENTIONS: Therapeutic exercises, Therapeutic activity, Neuromuscular re-education, Balance training, Gait training, Patient/Family education, Self Care, Joint mobilization, Visual/preceptual remediation/compensation, and Manual therapy  PLAN FOR NEXT SESSION: start balance   Tariq Pernell W, PT 08/05/2022, 10:12 AM

## 2022-08-19 ENCOUNTER — Encounter: Payer: Self-pay | Admitting: Physical Therapy

## 2022-08-19 ENCOUNTER — Ambulatory Visit: Payer: Medicare Other | Admitting: Physical Therapy

## 2022-08-19 DIAGNOSIS — R2681 Unsteadiness on feet: Secondary | ICD-10-CM

## 2022-08-19 DIAGNOSIS — R262 Difficulty in walking, not elsewhere classified: Secondary | ICD-10-CM

## 2022-08-19 NOTE — Therapy (Signed)
OUTPATIENT PHYSICAL THERAPY LOWER EXTREMITY EVALUATION   Patient Name: Teresa Boyd MRN: [id number] DOB:[birth year], 88 y.o., female Today's Date: 08/19/2022   PT End of Session - 08/19/22 1300     Visit Number 2    Date for PT Re-Evaluation 10/28/22    PT Start Time 1300    PT Stop Time 8413    PT Time Calculation (min) 45 min    Activity Tolerance Patient tolerated treatment well    Behavior During Therapy [facility] for tasks assessed/performed             Past Medical History:  Diagnosis Date   Arthritis    Breast cancer ([place]) 2017   left breast   Colon cancer ([place]) 1994   History of radiation therapy 06/16/16- 07/13/16   Left Breast   Hx of adenomatous colonic polyps    Personal history of radiation therapy    PONV (postoperative nausea and vomiting)    nausea only   Varicose vein    Past Surgical History:  Procedure Laterality Date   ABDOMINAL HYSTERECTOMY     BREAST BIOPSY Right    BREAST BIOPSY Right    BREAST LUMPECTOMY Left 2017   COLON RESECTION     sigmoid   COLON SURGERY  1994   colon cancer   COLONOSCOPY W/ POLYPECTOMY  last 02/18/11   multiple, prior colon cancer and polyps, 7 mm adenoma 2012   OVARIAN CYST SURGERY     PARTIAL MASTECTOMY WITH NEEDLE LOCALIZATION Left 04/22/2016   Procedure: LEFT PARTIAL MASTECTOMY WITH DOUBLE NEEDLE LOCALIZATION REEXCISION INFERIOR AND LATERAL MARGIN ADJACENT TISSUE TRANSFER;  Surgeon: [name], MD;  Location: [place];  Service: General;  Laterality: Left;   TONSILLECTOMY     TOTAL HIP ARTHROPLASTY Bilateral 01/14/2021   Procedure: TOTAL HIP ARTHROPLASTY ANTERIOR APPROACH WITH LEFT KNEE CORTISONE INJECTION;  Surgeon: [name], MD;  [facility];  Service: Orthopedics;  Laterality: Bilateral;   Patient Active Problem List   Diagnosis Date Noted   Primary osteoarthritis of right hip 01/14/2021   Status post total hip replacement, right 01/14/2021   Malignant neoplasm of upper-outer  quadrant of left breast in female, estrogen receptor positive ([place]) 03/27/2016   Increased frequency of urination 01/31/2014   Allergic rhinitis due to pollen 12/30/2011   Hx of herpes zoster 12/30/2011   Leukocytopenia 12/30/2011   Malignant neoplasm of colon ([place]) 12/30/2011   Symptoms involving cardiovascular system 24/40/1027   Uncomplicated varicose veins 12/30/2011   Vitamin D deficiency 12/30/2011   HEMORRHOIDS-EXTERNAL 11/08/2008   HIATAL HERNIA 02/16/2007   Osteoporosis 02/16/2007   History of malignant neoplasm of large intestine 02/16/2007   Hiatal hernia 02/16/2007    PCP: Eldridge Abrahams  REFERRING PROVIDER: Eldridge Abrahams  REFERRING DIAG: falls, gait instability  THERAPY DIAG:  Unsteadiness on feet  Difficulty in walking, not elsewhere classified  Rationale for Evaluation and Treatment: Rehabilitation  ONSET DATE: 07/15/22  SUBJECTIVE:   SUBJECTIVE STATEMENT: "Im doing fine"    PERTINENT HISTORY: See above PAIN:  Are you having pain? No  PRECAUTIONS: None and Other:    WEIGHT BEARING RESTRICTIONS: No  FALLS:  Has patient fallen in last 6 months? No and has had some times off balance  LIVING ENVIRONMENT: Lives with: lives alone Lives in: House/apartment Stairs: No Has following equipment at home: None  OCCUPATION: retired  PLOF: Independent and does her own housework and yardwork  PATIENT GOALS: better balance, get up easier from chair  NEXT MD  VISIT:   OBJECTIVE:   COGNITION: Overall cognitive status: Within functional limits for tasks assessed     SENSATION: WFL POSTURE: rounded shoulders, forward head, decreased lumbar lordosis, and increased thoracic kyphosis  PALPATION: Non tender, fair mm tone  LOWER EXTREMITY ROM:  WFL, some crepitus in the left knee   LOWER EXTREMITY MMT:  MMT Right eval Left eval  Hip flexion 4- 4-  Hip extension    Hip abduction    Hip adduction    Hip internal rotation    Hip external rotation     Knee flexion 4- 4-  Knee extension 4- 4-  Ankle dorsiflexion 4- 4-  Ankle plantarflexion    Ankle inversion    Ankle eversion     (Blank rows = not tested)  FUNCTIONAL TESTS:  5 times sit to stand: 24 Timed up and go (TUG): 16 off balance when first few steps  BERG 38/56  3 years ago she scored 49/56 GAIT: Distance walked: 100' Assistive device utilized: None Level of assistance: Complete Independence Comments: a little unsteady as she starts sometimes off to the side but mostly can correct herself   TODAY'S TREATMENT:                                                                                                                              DATE:   08/19/22 NuStep L4 x 6 min LAQ 2lb 2x10  Hamstring curls red 2x10  Alt 4 in box taps 2x10 Backwards walking  Side steps  Standing on airex reaching outside BOS On airex standing march      PATIENT EDUCATION:  Education details: HEP for start of balance, 3 way kick, march and one legged stance with support Person educated: Patient Education method: Consulting civil engineer, Demonstration, Verbal cues, and Handouts Education comprehension: verbalized understanding  HOME EXERCISE PROGRAM: As above  ASSESSMENT:  CLINICAL IMPRESSION:  Pt enters doing well with no pain. Session consisted of some light LE strengthening and balance interventions. Cues for eccentric control needed with leg curls.Some L knee valgus noted with gait. Cue to increase step length with backwards walking.   OBJECTIVE IMPAIRMENTS: Abnormal gait, cardiopulmonary status limiting activity, decreased activity tolerance, decreased balance, decreased mobility, difficulty walking, decreased ROM, and decreased strength.   REHAB POTENTIAL: Good  CLINICAL DECISION MAKING: Stable/uncomplicated  EVALUATION COMPLEXITY: Low   GOALS: Goals reviewed with patient? Yes  SHORT TERM GOALS: Target date:08/19/22 Independent with initial HEP Goal status: Progressing  LONG  TERM GOALS: Target date: 10/28/22  Decrease TUG to 12 seconds Goal status: INITIAL  2.  Increase Berg balance score to 46/56 Goal status: INITIAL  3.  Get up from sitting without hands Goal status: INITIAL  4.  Decrease 5XSTS to 19 seconds Goal status: INITIAL  5.  Independent with advanced HEP Baseline:  Goal status: INITIAL  PLAN:  PT FREQUENCY: 1-2x/week  PT DURATION: 12 weeks  PLANNED INTERVENTIONS: Therapeutic exercises, Therapeutic activity, Neuromuscular re-education, Balance  training, Gait training, Patient/Family education, Self Care, Joint mobilization, Visual/preceptual remediation/compensation, and Manual therapy  PLAN FOR NEXT SESSION: start balance   Scot Jun, PTA 08/19/2022, 1:01 PM

## 2022-08-26 ENCOUNTER — Encounter: Payer: Self-pay | Admitting: Physical Therapy

## 2022-08-26 ENCOUNTER — Ambulatory Visit: Payer: Medicare Other | Attending: Nurse Practitioner | Admitting: Physical Therapy

## 2022-08-26 DIAGNOSIS — R2681 Unsteadiness on feet: Secondary | ICD-10-CM | POA: Insufficient documentation

## 2022-08-26 DIAGNOSIS — R278 Other lack of coordination: Secondary | ICD-10-CM | POA: Diagnosis present

## 2022-08-26 DIAGNOSIS — R293 Abnormal posture: Secondary | ICD-10-CM | POA: Diagnosis present

## 2022-08-26 DIAGNOSIS — R262 Difficulty in walking, not elsewhere classified: Secondary | ICD-10-CM | POA: Insufficient documentation

## 2022-08-26 DIAGNOSIS — M6281 Muscle weakness (generalized): Secondary | ICD-10-CM | POA: Insufficient documentation

## 2022-08-26 NOTE — Therapy (Signed)
OUTPATIENT PHYSICAL THERAPY LOWER EXTREMITY EVALUATION   Patient Name: Teresa Boyd MRN: [id number] DOB:[birth year], 88 y.o., female Today's Date: 08/26/2022   PT End of Session - 08/26/22 1448     Visit Number 3    Date for PT Re-Evaluation 10/28/22    PT Start Time 4854    PT Stop Time 6270    PT Time Calculation (min) 38 min    Activity Tolerance Patient tolerated treatment well    Behavior During Therapy [facility] for tasks assessed/performed              Past Medical History:  Diagnosis Date   Arthritis    Breast cancer ([place]) 2017   left breast   Colon cancer ([place]) 1994   History of radiation therapy 06/16/16- 07/13/16   Left Breast   Hx of adenomatous colonic polyps    Personal history of radiation therapy    PONV (postoperative nausea and vomiting)    nausea only   Varicose vein    Past Surgical History:  Procedure Laterality Date   ABDOMINAL HYSTERECTOMY     BREAST BIOPSY Right    BREAST BIOPSY Right    BREAST LUMPECTOMY Left 2017   COLON RESECTION     sigmoid   COLON SURGERY  1994   colon cancer   COLONOSCOPY W/ POLYPECTOMY  last 02/18/11   multiple, prior colon cancer and polyps, 7 mm adenoma 2012   OVARIAN CYST SURGERY     PARTIAL MASTECTOMY WITH NEEDLE LOCALIZATION Left 04/22/2016   Procedure: LEFT PARTIAL MASTECTOMY WITH DOUBLE NEEDLE LOCALIZATION REEXCISION INFERIOR AND LATERAL MARGIN ADJACENT TISSUE TRANSFER;  Surgeon: [name], MD;  Location: [place];  Service: General;  Laterality: Left;   TONSILLECTOMY     TOTAL HIP ARTHROPLASTY Bilateral 01/14/2021   Procedure: TOTAL HIP ARTHROPLASTY ANTERIOR APPROACH WITH LEFT KNEE CORTISONE INJECTION;  Surgeon: [name], MD;  Location: [facility];  Service: Orthopedics;  Laterality: Bilateral;   Patient Active Problem List   Diagnosis Date Noted   Primary osteoarthritis of right hip 01/14/2021   Status post total hip replacement, right 01/14/2021   Malignant neoplasm of upper-outer  quadrant of left breast in female, estrogen receptor positive ([place]) 03/27/2016   Increased frequency of urination 01/31/2014   Allergic rhinitis due to pollen 12/30/2011   Hx of herpes zoster 12/30/2011   Leukocytopenia 12/30/2011   Malignant neoplasm of colon ([place]) 12/30/2011   Symptoms involving cardiovascular system 35/00/9381   Uncomplicated varicose veins 12/30/2011   Vitamin D deficiency 12/30/2011   HEMORRHOIDS-EXTERNAL 11/08/2008   HIATAL HERNIA 02/16/2007   Osteoporosis 02/16/2007   History of malignant neoplasm of large intestine 02/16/2007   Hiatal hernia 02/16/2007    PCP: Eldridge Abrahams  REFERRING PROVIDER: Eldridge Abrahams  REFERRING DIAG: falls, gait instability  THERAPY DIAG:  Abnormal posture  Difficulty in walking, not elsewhere classified  Muscle weakness (generalized)  Other lack of coordination  Unsteadiness on feet  Rationale for Evaluation and Treatment: Rehabilitation  ONSET DATE: 07/15/22  SUBJECTIVE:   SUBJECTIVE STATEMENT: Patient reports that she is doing ok. She is having some arthritic pain in her L shoulder and B hip pain. It fluctuates.  PERTINENT HISTORY: See above PAIN:  Are you having pain? No  PRECAUTIONS: None and Other:    WEIGHT BEARING RESTRICTIONS: No  FALLS:  Has patient fallen in last 6 months? No and has had some times off balance  LIVING ENVIRONMENT: Lives with: lives alone Lives in: House/apartment Stairs: No Has  following equipment at home: None  OCCUPATION: retired  PLOF: Independent and does her own housework and yardwork  PATIENT GOALS: better balance, get up easier from chair  NEXT MD VISIT:   OBJECTIVE:   COGNITION: Overall cognitive status: Within functional limits for tasks assessed     SENSATION: WFL POSTURE: rounded shoulders, forward head, decreased lumbar lordosis, and increased thoracic kyphosis  PALPATION: Non tender, fair mm tone  LOWER EXTREMITY ROM:  WFL, some crepitus in the left  knee   LOWER EXTREMITY MMT:  MMT Right eval Left eval  Hip flexion 4- 4-  Hip extension    Hip abduction    Hip adduction    Hip internal rotation    Hip external rotation    Knee flexion 4- 4-  Knee extension 4- 4-  Ankle dorsiflexion 4- 4-  Ankle plantarflexion    Ankle inversion    Ankle eversion     (Blank rows = not tested)  FUNCTIONAL TESTS:  5 times sit to stand: 24 Timed up and go (TUG): 16 off balance when first few steps  BERG 38/56  3 years ago she scored 49/56 GAIT: Distance walked: 100' Assistive device utilized: None Level of assistance: Complete Independence Comments: a little unsteady as she starts sometimes off to the side but mostly can correct herself   TODAY'S TREATMENT:                                                                                                                              DATE:   08/26/22 NuStep L5 x 5 minutes Lower body mobilization on physiodisc, required mod VC to move from lower body. Mini squats, 2 x 10, VC to maintain knees at shoulder width apart and to educate her to lean forward to engage hips. Seated clamshells with Red Tband resistance, 2 x 10 reps Stand at counter, quick steps side to side, heel raises, quick steps forward and back, 2 x 10 each, with UE support.  08/19/22 NuStep L4 x 6 min LAQ 2lb 2x10  Hamstring curls red 2x10  Alt 4 in box taps 2x10 Backwards walking  Side steps  Standing on airex reaching outside BOS On airex standing march      PATIENT EDUCATION:  Education details: HEP for start of balance, 3 way kick, march and one legged stance with support Person educated: Patient Education method: Explanation, Demonstration, Verbal cues, and Handouts Education comprehension: verbalized understanding  HOME EXERCISE PROGRAM: As above  [id number]  ASSESSMENT:  CLINICAL IMPRESSION:  Pt enters doing well with no pain. Treatment focused on both strength and balance training to decrease fall risk.  She required VC for correct technique and to slow down at times, but participated well. HEP updated.  OBJECTIVE IMPAIRMENTS: Abnormal gait, cardiopulmonary status limiting activity, decreased activity tolerance, decreased balance, decreased mobility, difficulty walking, decreased ROM, and decreased strength.   REHAB POTENTIAL: Good  CLINICAL DECISION MAKING: Stable/uncomplicated  EVALUATION COMPLEXITY: Low  GOALS: Goals reviewed with patient? Yes  SHORT TERM GOALS: Target date:08/19/22 Independent with initial HEP Goal status: Progressing  LONG TERM GOALS: Target date: 10/28/22  Decrease TUG to 12 seconds Goal status: INITIAL  2.  Increase Berg balance score to 46/56 Goal status: INITIAL  3.  Get up from sitting without hands Goal status: ongoing  4.  Decrease 5XSTS to 19 seconds Goal status: ongoing  5.  Independent with advanced HEP Baseline:  Goal status: INITIAL  PLAN:  PT FREQUENCY: 1-2x/week  PT DURATION: 12 weeks  PLANNED INTERVENTIONS: Therapeutic exercises, Therapeutic activity, Neuromuscular re-education, Balance training, Gait training, Patient/Family education, Self Care, Joint mobilization, Visual/preceptual remediation/compensation, and Manual therapy  PLAN FOR NEXT SESSION: start balance   Ethel Rana DPT 08/26/22 2:49 PM  08/26/2022, 2:49 PM

## 2022-09-02 ENCOUNTER — Encounter: Payer: Self-pay | Admitting: Physical Therapy

## 2022-09-02 ENCOUNTER — Ambulatory Visit: Payer: Medicare Other | Admitting: Physical Therapy

## 2022-09-02 DIAGNOSIS — R293 Abnormal posture: Secondary | ICD-10-CM

## 2022-09-02 DIAGNOSIS — R2681 Unsteadiness on feet: Secondary | ICD-10-CM

## 2022-09-02 DIAGNOSIS — M6281 Muscle weakness (generalized): Secondary | ICD-10-CM

## 2022-09-02 DIAGNOSIS — R278 Other lack of coordination: Secondary | ICD-10-CM

## 2022-09-02 DIAGNOSIS — R262 Difficulty in walking, not elsewhere classified: Secondary | ICD-10-CM

## 2022-09-02 NOTE — Therapy (Signed)
OUTPATIENT PHYSICAL THERAPY LOWER EXTREMITY EVALUATION   Patient Name: NUSAYBAH IVIE MRN: [id number] DOB:1931/09/01, 88 y.o., female Today's Date: 09/02/2022   PT End of Session - 09/02/22 1300     Visit Number 4    Date for PT Re-Evaluation 10/28/22    PT Start Time 1300    PT Stop Time 5284    PT Time Calculation (min) 45 min    Activity Tolerance Patient tolerated treatment well    Behavior During Therapy [facility] for tasks assessed/performed              Past Medical History:  Diagnosis Date   Arthritis    Breast cancer ([place]) 2017   left breast   Colon cancer ([place]) 1994   History of radiation therapy 06/16/16- 07/13/16   Left Breast   Hx of adenomatous colonic polyps    Personal history of radiation therapy    PONV (postoperative nausea and vomiting)    nausea only   Varicose vein    Past Surgical History:  Procedure Laterality Date   ABDOMINAL HYSTERECTOMY     BREAST BIOPSY Right    BREAST BIOPSY Right    BREAST LUMPECTOMY Left 2017   COLON RESECTION     sigmoid   COLON SURGERY  1994   colon cancer   COLONOSCOPY W/ POLYPECTOMY  last 02/18/11   multiple, prior colon cancer and polyps, 7 mm adenoma 2012   OVARIAN CYST SURGERY     PARTIAL MASTECTOMY WITH NEEDLE LOCALIZATION Left 04/22/2016   Procedure: LEFT PARTIAL MASTECTOMY WITH DOUBLE NEEDLE LOCALIZATION REEXCISION INFERIOR AND LATERAL MARGIN ADJACENT TISSUE TRANSFER;  Surgeon: [name], MD;  Location: [place];  Service: General;  Laterality: Left;   TONSILLECTOMY     TOTAL HIP ARTHROPLASTY Bilateral 01/14/2021   Procedure: TOTAL HIP ARTHROPLASTY ANTERIOR APPROACH WITH LEFT KNEE CORTISONE INJECTION;  Surgeon: [name], MD;  [facility];  Service: Orthopedics;  Laterality: Bilateral;   Patient Active Problem List   Diagnosis Date Noted   Primary osteoarthritis of right hip 01/14/2021   Status post total hip replacement, right 01/14/2021   Malignant neoplasm of upper-outer  quadrant of left breast in female, estrogen receptor positive ([place]) 03/27/2016   Increased frequency of urination 01/31/2014   Allergic rhinitis due to pollen 12/30/2011   Hx of herpes zoster 12/30/2011   Leukocytopenia 12/30/2011   Malignant neoplasm of colon ([place]) 12/30/2011   Symptoms involving cardiovascular system 13/24/4010   Uncomplicated varicose veins 12/30/2011   Vitamin D deficiency 12/30/2011   HEMORRHOIDS-EXTERNAL 11/08/2008   HIATAL HERNIA 02/16/2007   Osteoporosis 02/16/2007   History of malignant neoplasm of large intestine 02/16/2007   Hiatal hernia 02/16/2007    PCP: Eldridge Abrahams  REFERRING PROVIDER: Eldridge Abrahams  REFERRING DIAG: falls, gait instability  THERAPY DIAG:  Abnormal posture  Difficulty in walking, not elsewhere classified  Muscle weakness (generalized)  Other lack of coordination  Unsteadiness on feet  Rationale for Evaluation and Treatment: Rehabilitation  ONSET DATE: 07/15/22  SUBJECTIVE:   SUBJECTIVE STATEMENT: "Im all right"   PERTINENT HISTORY: See above PAIN:  Are you having pain? No  PRECAUTIONS: None and Other:    WEIGHT BEARING RESTRICTIONS: No  FALLS:  Has patient fallen in last 6 months? 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  PATIENT GOALS: better balance, get up easier from chair  NEXT MD VISIT:   OBJECTIVE:   COGNITION: Overall cognitive status: Within functional limits for tasks assessed     SENSATION: WFL POSTURE: rounded shoulders, forward head, decreased lumbar lordosis, and increased thoracic kyphosis  PALPATION: Non tender, fair mm tone  LOWER EXTREMITY ROM:  WFL, some crepitus in the left knee   LOWER EXTREMITY MMT:  MMT Right eval Left eval  Hip flexion 4- 4-  Hip extension    Hip  abduction    Hip adduction    Hip internal rotation    Hip external rotation    Knee flexion 4- 4-  Knee extension 4- 4-  Ankle dorsiflexion 4- 4-  Ankle plantarflexion    Ankle inversion    Ankle eversion     (Blank rows = not tested)  FUNCTIONAL TESTS:  5 times sit to stand: 24 Timed up and go (TUG): 16 off balance when first few steps  BERG 38/56  3 years ago she scored 49/56 GAIT: Distance walked: 100' Assistive device utilized: None Level of assistance: Complete Independence Comments: a little unsteady as she starts sometimes off to the side but mostly can correct herself   TODAY'S TREATMENT:                                                                                                                              DATE:   09/02/22 NuStep L5 x 6 min  20lb resisted gait all directions x3 Alt 4 in box taps x10 Alt 6 in box taps x10 S2S on airex 2x5 ALT 6 in taps from airex 2x10 Step up at stairs 4 & 6 inch x10   08/26/22 NuStep L5 x 5 minutes Lower body mobilization on physiodisc, required mod VC to move from lower body. Mini squats, 2 x 10, VC to maintain knees at shoulder width apart and to educate her to lean forward to engage hips. Seated clamshells with Red Tband resistance, 2 x 10 reps Stand at counter, quick steps side to side, heel raises, quick steps forward and back, 2 x 10 each, with UE support.  08/19/22 NuStep L4 x 6 min LAQ 2lb 2x10  Hamstring curls red 2x10  Alt 4 in box taps 2x10 Backwards walking  Side steps  Standing on airex reaching outside BOS On airex standing march      PATIENT EDUCATION:  Education details: HEP for start of balance, 3 way kick, march and one legged stance with support Person educated: Patient Education method: Explanation, Demonstration, Verbal cues, and Handouts Education comprehension: verbalized understanding  HOME EXERCISE PROGRAM: As above  [id number]  ASSESSMENT:  CLINICAL IMPRESSION:  Pt enters doing  well with no pain. Treatment focused on both functional strength and balance training to decrease fall risk. Cues needed for eccentric control with sit to stands. Some initial hesitation with alt taps but gained confidence as reps progressed. OBJECTIVE IMPAIRMENTS: Abnormal gait, cardiopulmonary  status limiting activity, decreased activity tolerance, decreased balance, decreased mobility, difficulty walking, decreased ROM, and decreased strength.   REHAB POTENTIAL: Good  CLINICAL DECISION MAKING: Stable/uncomplicated  EVALUATION COMPLEXITY: Low   GOALS: Goals reviewed with patient? Yes  SHORT TERM GOALS: Target date:08/19/22 Independent with initial HEP Goal status: Progressing  LONG TERM GOALS: Target date: 10/28/22  Decrease TUG to 12 seconds Goal status: INITIAL  2.  Increase Berg balance score to 46/56 Goal status: INITIAL  3.  Get up from sitting without hands Goal status: ongoing  4.  Decrease 5XSTS to 19 seconds Goal status: ongoing  5.  Independent with advanced HEP Baseline:  Goal status: INITIAL  PLAN:  PT FREQUENCY: 1-2x/week  PT DURATION: 12 weeks  PLANNED INTERVENTIONS: Therapeutic exercises, Therapeutic activity, Neuromuscular re-education, Balance training, Gait training, Patient/Family education, Self Care, Joint mobilization, Visual/preceptual remediation/compensation, and Manual therapy  PLAN FOR NEXT SESSION: start balance   Ethel Rana DPT 09/02/22 1:00 PM  09/02/2022, 1:00 PM

## 2022-09-09 ENCOUNTER — Encounter: Payer: Self-pay | Admitting: Physical Therapy

## 2022-09-09 ENCOUNTER — Ambulatory Visit: Payer: Medicare Other | Admitting: Physical Therapy

## 2022-09-09 DIAGNOSIS — M6281 Muscle weakness (generalized): Secondary | ICD-10-CM

## 2022-09-09 DIAGNOSIS — R278 Other lack of coordination: Secondary | ICD-10-CM

## 2022-09-09 DIAGNOSIS — R262 Difficulty in walking, not elsewhere classified: Secondary | ICD-10-CM

## 2022-09-09 DIAGNOSIS — R293 Abnormal posture: Secondary | ICD-10-CM

## 2022-09-09 DIAGNOSIS — R2681 Unsteadiness on feet: Secondary | ICD-10-CM

## 2022-09-09 NOTE — Therapy (Signed)
OUTPATIENT PHYSICAL THERAPY LOWER EXTREMITY EVALUATION   Patient Name: RADA ZEGERS MRN: [id number] DOB:26-Aug-1931, 88 y.o., female Today's Date: 09/09/2022   PT End of Session - 09/09/22 1300     Visit Number 5    Date for PT Re-Evaluation 10/28/22    PT Start Time 1300    PT Stop Time 1345    PT Time Calculation (min) 45 min    Activity Tolerance Patient tolerated treatment well    Behavior During Therapy [facility] for tasks assessed/performed              Past Medical History:  Diagnosis Date   Arthritis    Breast cancer ([place]) 2017   left breast   Colon cancer ([place]) 1994   History of radiation therapy 06/16/16- 07/13/16   Left Breast   Hx of adenomatous colonic polyps    Personal history of radiation therapy    PONV (postoperative nausea and vomiting)    nausea only   Varicose vein    Past Surgical History:  Procedure Laterality Date   ABDOMINAL HYSTERECTOMY     BREAST BIOPSY Right    BREAST BIOPSY Right    BREAST LUMPECTOMY Left 2017   COLON RESECTION     sigmoid   COLON SURGERY  1994   colon cancer   COLONOSCOPY W/ POLYPECTOMY  last 02/18/11   multiple, prior colon cancer and polyps, 7 mm adenoma 2012   OVARIAN CYST SURGERY     PARTIAL MASTECTOMY WITH NEEDLE LOCALIZATION Left 04/22/2016   Procedure: LEFT PARTIAL MASTECTOMY WITH DOUBLE NEEDLE LOCALIZATION REEXCISION INFERIOR AND LATERAL MARGIN ADJACENT TISSUE TRANSFER;  Surgeon: [name], MD;  Location: [place];  Service: General;  Laterality: Left;   TONSILLECTOMY     TOTAL HIP ARTHROPLASTY Bilateral 01/14/2021   Procedure: TOTAL HIP ARTHROPLASTY ANTERIOR APPROACH WITH LEFT KNEE CORTISONE INJECTION;  Surgeon: [name], MD;  [facility];  Service: Orthopedics;  Laterality: Bilateral;   Patient Active Problem List   Diagnosis Date Noted   Primary osteoarthritis of right hip 01/14/2021   Status post total hip replacement, right 01/14/2021   Malignant neoplasm of upper-outer  quadrant of left breast in female, estrogen receptor positive ([place]) 03/27/2016   Increased frequency of urination 01/31/2014   Allergic rhinitis due to pollen 12/30/2011   Hx of herpes zoster 12/30/2011   Leukocytopenia 12/30/2011   Malignant neoplasm of colon ([place]) 12/30/2011   Symptoms involving cardiovascular system 54/65/6812   Uncomplicated varicose veins 12/30/2011   Vitamin D deficiency 12/30/2011   HEMORRHOIDS-EXTERNAL 11/08/2008   HIATAL HERNIA 02/16/2007   Osteoporosis 02/16/2007   History of malignant neoplasm of large intestine 02/16/2007   Hiatal hernia 02/16/2007    PCP: Eldridge Abrahams  REFERRING PROVIDER: Eldridge Abrahams  REFERRING DIAG: falls, gait instability  THERAPY DIAG:  Abnormal posture  Difficulty in walking, not elsewhere classified  Muscle weakness (generalized)  Other lack of coordination  Unsteadiness on feet  Rationale for Evaluation and Treatment: Rehabilitation  ONSET DATE: 07/15/22  SUBJECTIVE:   SUBJECTIVE STATEMENT: "Im ok"   PERTINENT HISTORY: See above PAIN:  Are you having pain? No  PRECAUTIONS: None and Other:    WEIGHT BEARING RESTRICTIONS: No  FALLS:  Has patient fallen in last 6 months? 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  PATIENT  GOALS: better balance, get up easier from chair  NEXT MD VISIT:   OBJECTIVE:   COGNITION: Overall cognitive status: Within functional limits for tasks assessed     SENSATION: WFL POSTURE: rounded shoulders, forward head, decreased lumbar lordosis, and increased thoracic kyphosis  PALPATION: Non tender, fair mm tone  LOWER EXTREMITY ROM:  WFL, some crepitus in the left knee   LOWER EXTREMITY MMT:  MMT Right eval Left eval  Hip flexion 4- 4-  Hip extension    Hip abduction     Hip adduction    Hip internal rotation    Hip external rotation    Knee flexion 4- 4-  Knee extension 4- 4-  Ankle dorsiflexion 4- 4-  Ankle plantarflexion    Ankle inversion    Ankle eversion     (Blank rows = not tested)  FUNCTIONAL TESTS:  5 times sit to stand: 24 Timed up and go (TUG): 16 off balance when first few steps  BERG 38/56  3 years ago she scored 49/56 GAIT: Distance walked: 100' Assistive device utilized: None Level of assistance: Complete Independence Comments: a little unsteady as she starts sometimes off to the side but mostly can correct herself   TODAY'S TREATMENT:                                                                                                                              DATE:   09/09/22 NuStep L4 x 5 min Hamstring curls green x 10 each LAQ 3lb x 10 each  Side steps over WaTE bar Side step on and off airex From airex 6 in alt box taps 20lb gait all directions resisted x3  09/02/22 NuStep L5 x 6 min  20lb gait all directions resisted x3 Alt 4 in box taps x10 Alt 6 in box taps x10 S2S on airex 2x5 ALT 6 in taps from airex 2x10 Step up at stairs 4 & 6 inch x10   08/26/22 NuStep L5 x 5 minutes Lower body mobilization on physiodisc, required mod VC to move from lower body. Mini squats, 2 x 10, VC to maintain knees at shoulder width apart and to educate her to lean forward to engage hips. Seated clamshells with Red Tband resistance, 2 x 10 reps Stand at counter, quick steps side to side, heel raises, quick steps forward and back, 2 x 10 each, with UE support.  08/19/22 NuStep L4 x 6 min LAQ 2lb 2x10  Hamstring curls red 2x10  Alt 4 in box taps 2x10 Backwards walking  Side steps  Standing on airex reaching outside BOS On airex standing march      PATIENT EDUCATION:  Education details: HEP for start of balance, 3 way kick, march and one legged stance with support Person educated: Patient Education method: Explanation,  Demonstration, Verbal cues, and Handouts Education comprehension: verbalized understanding  HOME EXERCISE PROGRAM: As above  [id number]  ASSESSMENT:  CLINICAL IMPRESSION:  Pt enters doing well  with no pain. Again treatment focused on both functional strength and balance training to decrease fall risk. CGA required with side step over WaTE bar and on airex. Cue for eccentric control needed with resisted gait    OBJECTIVE IMPAIRMENTS: Abnormal gait, cardiopulmonary status limiting activity, decreased activity tolerance, decreased balance, decreased mobility, difficulty walking, decreased ROM, and decreased strength.   REHAB POTENTIAL: Good  CLINICAL DECISION MAKING: Stable/uncomplicated  EVALUATION COMPLEXITY: Low   GOALS: Goals reviewed with patient? Yes  SHORT TERM GOALS: Target date:08/19/22 Independent with initial HEP Goal status: Progressing  LONG TERM GOALS: Target date: 10/28/22  Decrease TUG to 12 seconds Goal status: INITIAL  2.  Increase Berg balance score to 46/56 Goal status: INITIAL  3.  Get up from sitting without hands Goal status: ongoing  4.  Decrease 5XSTS to 19 seconds Goal status: ongoing  5.  Independent with advanced HEP Baseline:  Goal status: INITIAL  PLAN:  PT FREQUENCY: 1-2x/week  PT DURATION: 12 weeks  PLANNED INTERVENTIONS: Therapeutic exercises, Therapeutic activity, Neuromuscular re-education, Balance training, Gait training, Patient/Family education, Self Care, Joint mobilization, Visual/preceptual remediation/compensation, and Manual therapy  PLAN FOR NEXT SESSION: start balance   Ethel Rana DPT 09/09/22 1:04 PM  09/09/2022, 1:04 PM

## 2022-09-23 ENCOUNTER — Ambulatory Visit: Payer: Medicare Other | Admitting: Physical Therapy

## 2022-09-30 ENCOUNTER — Ambulatory Visit: Payer: Medicare Other | Admitting: Physical Therapy

## 2022-10-07 ENCOUNTER — Ambulatory Visit: Payer: Medicare Other | Attending: Nurse Practitioner | Admitting: Physical Therapy

## 2022-10-07 ENCOUNTER — Encounter: Payer: Self-pay | Admitting: Physical Therapy

## 2022-10-07 DIAGNOSIS — R2681 Unsteadiness on feet: Secondary | ICD-10-CM | POA: Insufficient documentation

## 2022-10-07 DIAGNOSIS — R278 Other lack of coordination: Secondary | ICD-10-CM | POA: Insufficient documentation

## 2022-10-07 DIAGNOSIS — M6281 Muscle weakness (generalized): Secondary | ICD-10-CM | POA: Diagnosis present

## 2022-10-07 DIAGNOSIS — R293 Abnormal posture: Secondary | ICD-10-CM | POA: Insufficient documentation

## 2022-10-07 DIAGNOSIS — R262 Difficulty in walking, not elsewhere classified: Secondary | ICD-10-CM | POA: Diagnosis present

## 2022-10-07 NOTE — Therapy (Signed)
OUTPATIENT PHYSICAL THERAPY LOWER EXTREMITY EVALUATION   Patient Name: Teresa Boyd MRN: [id number] DOB:03/06/1931, 89 y.o., female Today's Date: 10/07/2022   PT End of Session - 10/07/22 1301     Visit Number 6    Date for PT Re-Evaluation 10/28/22    PT Start Time 1300    PT Stop Time 1345    PT Time Calculation (min) 45 min    Activity Tolerance Patient tolerated treatment well    Behavior During Therapy [facility] for tasks assessed/performed              Past Medical History:  Diagnosis Date   Arthritis    Breast cancer ([place]) 2017   left breast   Colon cancer ([place]) 1994   History of radiation therapy 06/16/16- 07/13/16   Left Breast   Hx of adenomatous colonic polyps    Personal history of radiation therapy    PONV (postoperative nausea and vomiting)    nausea only   Varicose vein    Past Surgical History:  Procedure Laterality Date   ABDOMINAL HYSTERECTOMY     BREAST BIOPSY Right    BREAST BIOPSY Right    BREAST LUMPECTOMY Left 2017   COLON RESECTION     sigmoid   COLON SURGERY  1994   colon cancer   COLONOSCOPY W/ POLYPECTOMY  last 02/18/11   multiple, prior colon cancer and polyps, 7 mm adenoma 2012   OVARIAN CYST SURGERY     PARTIAL MASTECTOMY WITH NEEDLE LOCALIZATION Left 04/22/2016   Procedure: LEFT PARTIAL MASTECTOMY WITH DOUBLE NEEDLE LOCALIZATION REEXCISION INFERIOR AND LATERAL MARGIN ADJACENT TISSUE TRANSFER;  Surgeon: [name], MD;  Location: [place];  Service: General;  Laterality: Left;   TONSILLECTOMY     TOTAL HIP ARTHROPLASTY Bilateral 01/14/2021   Procedure: TOTAL HIP ARTHROPLASTY ANTERIOR APPROACH WITH LEFT KNEE CORTISONE INJECTION;  Surgeon: [name], MD;  [facility];  Service: Orthopedics;  Laterality: Bilateral;   Patient Active Problem List   Diagnosis Date Noted   Primary osteoarthritis of right hip 01/14/2021   Status post total hip replacement, right 01/14/2021   Malignant neoplasm of upper-outer  quadrant of left breast in female, estrogen receptor positive ([place]) 03/27/2016   Increased frequency of urination 01/31/2014   Allergic rhinitis due to pollen 12/30/2011   Hx of herpes zoster 12/30/2011   Leukocytopenia 12/30/2011   Malignant neoplasm of colon ([place]) 12/30/2011   Symptoms involving cardiovascular system 75/06/2584   Uncomplicated varicose veins 12/30/2011   Vitamin D deficiency 12/30/2011   HEMORRHOIDS-EXTERNAL 11/08/2008   HIATAL HERNIA 02/16/2007   Osteoporosis 02/16/2007   History of malignant neoplasm of large intestine 02/16/2007   Hiatal hernia 02/16/2007    PCP: Eldridge Abrahams  REFERRING PROVIDER: Eldridge Abrahams  REFERRING DIAG: falls, gait instability  THERAPY DIAG:  Abnormal posture  Muscle weakness (generalized)  Other lack of coordination  Difficulty in walking, not elsewhere classified  Unsteadiness on feet  Rationale for Evaluation and Treatment: Rehabilitation  ONSET DATE: 07/15/22  SUBJECTIVE:   SUBJECTIVE STATEMENT: Was sick over christmas through january.   PERTINENT HISTORY: See above PAIN:  Are you having pain? No  PRECAUTIONS: None and Other:    WEIGHT BEARING RESTRICTIONS: No  FALLS:  Has patient fallen in last 6 months? No and has had some times off balance  LIVING ENVIRONMENT: Lives with: lives alone Lives in: House/apartment Stairs: No Has following equipment at home: None  OCCUPATION: retired  PLOF: Independent and does her own housework  and yardwork  PATIENT GOALS: better balance, get up easier from chair  NEXT MD VISIT:   OBJECTIVE:   COGNITION: Overall cognitive status: Within functional limits for tasks assessed     SENSATION: WFL POSTURE: rounded shoulders, forward head, decreased lumbar lordosis, and increased thoracic kyphosis  PALPATION: Non tender, fair mm tone  LOWER EXTREMITY ROM:  WFL, some crepitus in the left knee   LOWER EXTREMITY MMT:  MMT Right eval Left eval  Hip flexion 4- 4-   Hip extension    Hip abduction    Hip adduction    Hip internal rotation    Hip external rotation    Knee flexion 4- 4-  Knee extension 4- 4-  Ankle dorsiflexion 4- 4-  Ankle plantarflexion    Ankle inversion    Ankle eversion     (Blank rows = not tested)  FUNCTIONAL TESTS:  5 times sit to stand: 24 Timed up and go (TUG): 16 off balance when first few steps  BERG 38/56  3 years ago she scored 49/56 GAIT: Distance walked: 100' Assistive device utilized: None Level of assistance: Complete Independence Comments: a little unsteady as she starts sometimes off to the side but mostly can correct herself   TODAY'S TREATMENT:                                                                                                                              DATE:   10/07/22 NuStep L5 x 7 min LAQ 3lb 2x10 Seated March 3lb 2x10  Sit to stand 2x10  Rows and Ext green 2x10 Heel raises 2x10  09/09/22 NuStep L4 x 5 min Hamstring curls green x 10 each LAQ 3lb x 10 each  Side steps over WaTE bar Side step on and off airex From airex 6 in alt box taps 20lb gait all directions resisted x3  09/02/22 NuStep L5 x 6 min  20lb gait all directions resisted x3 Alt 4 in box taps x10 Alt 6 in box taps x10 S2S on airex 2x5 ALT 6 in taps from airex 2x10 Step up at stairs 4 & 6 inch x10   08/26/22 NuStep L5 x 5 minutes Lower body mobilization on physiodisc, required mod VC to move from lower body. Mini squats, 2 x 10, VC to maintain knees at shoulder width apart and to educate her to lean forward to engage hips. Seated clamshells with Red Tband resistance, 2 x 10 reps Stand at counter, quick steps side to side, heel raises, quick steps forward and back, 2 x 10 each, with UE support.  08/19/22 NuStep L4 x 6 min LAQ 2lb 2x10  Hamstring curls red 2x10  Alt 4 in box taps 2x10 Backwards walking  Side steps  Standing on airex reaching outside BOS On airex standing march      PATIENT  EDUCATION:  Education details: HEP for start of balance, 3 way kick, march and one legged stance with support Person  educated: Patient Education method: Explanation, Demonstration, Verbal cues, and Handouts Education comprehension: verbalized understanding  HOME EXERCISE PROGRAM: As above  [id number]  ASSESSMENT:  CLINICAL IMPRESSION:  Pt enters doing well with no pain. Pt returns tto therapy after bing sick over the holidays. A regressed treatment focused on both functional strength and balance training to decrease fall risk. Valgus tress at knees noted with sit to sand L>R.   OBJECTIVE IMPAIRMENTS: Abnormal gait, cardiopulmonary status limiting activity, decreased activity tolerance, decreased balance, decreased mobility, difficulty walking, decreased ROM, and decreased strength.   REHAB POTENTIAL: Good  CLINICAL DECISION MAKING: Stable/uncomplicated  EVALUATION COMPLEXITY: Low   GOALS: Goals reviewed with patient? Yes  SHORT TERM GOALS: Target date:08/19/22 Independent with initial HEP Goal status: Progressing  LONG TERM GOALS: Target date: 10/28/22  Decrease TUG to 12 seconds Goal status: INITIAL  2.  Increase Berg balance score to 46/56 Goal status: INITIAL  3.  Get up from sitting without hands Goal status: ongoing  4.  Decrease 5XSTS to 19 seconds Goal status: ongoing  5.  Independent with advanced HEP Baseline:  Goal status: INITIAL  PLAN:  PT FREQUENCY: 1-2x/week  PT DURATION: 12 weeks  PLANNED INTERVENTIONS: Therapeutic exercises, Therapeutic activity, Neuromuscular re-education, Balance training, Gait training, Patient/Family education, Self Care, Joint mobilization, Visual/preceptual remediation/compensation, and Manual therapy  PLAN FOR NEXT SESSION: start balance   Ethel Rana DPT 10/07/22 1:01 PM  10/07/2022, 1:01 PM

## 2022-10-14 ENCOUNTER — Ambulatory Visit: Payer: Medicare Other | Admitting: Physical Therapy

## 2022-10-21 ENCOUNTER — Ambulatory Visit: Payer: Medicare Other

## 2022-10-21 DIAGNOSIS — R293 Abnormal posture: Secondary | ICD-10-CM | POA: Diagnosis not present

## 2022-10-21 DIAGNOSIS — M6281 Muscle weakness (generalized): Secondary | ICD-10-CM

## 2022-10-21 DIAGNOSIS — R278 Other lack of coordination: Secondary | ICD-10-CM

## 2022-10-21 DIAGNOSIS — R2681 Unsteadiness on feet: Secondary | ICD-10-CM

## 2022-10-21 DIAGNOSIS — R262 Difficulty in walking, not elsewhere classified: Secondary | ICD-10-CM

## 2022-10-21 NOTE — Therapy (Signed)
OUTPATIENT PHYSICAL THERAPY DISCHARGE SUMMARY   Patient Name: Teresa Boyd MRN: [id number] DOB:[birth year], 89 y.o., female Today's Date: 10/21/2022   PT End of Session - 10/21/22 1420     Visit Number 7    Date for PT Re-Evaluation 10/28/22    Authorization Type [employer] [facility]    PT Start Time 1430    PT Stop Time 1506    PT Time Calculation (min) 36 min    Activity Tolerance Patient tolerated treatment well    Behavior During Therapy [facility] for tasks assessed/performed              Past Medical History:  Diagnosis Date   Arthritis    Breast cancer ([place]) 2017   left breast   Colon cancer ([place]) 1994   History of radiation therapy 06/16/16- 07/13/16   Left Breast   Hx of adenomatous colonic polyps    Personal history of radiation therapy    PONV (postoperative nausea and vomiting)    nausea only   Varicose vein    Past Surgical History:  Procedure Laterality Date   ABDOMINAL HYSTERECTOMY     BREAST BIOPSY Right    BREAST BIOPSY Right    BREAST LUMPECTOMY Left 2017   COLON RESECTION     sigmoid   COLON SURGERY  1994   colon cancer   COLONOSCOPY W/ POLYPECTOMY  last 02/18/11   multiple, prior colon cancer and polyps, 7 mm adenoma 2012   OVARIAN CYST SURGERY     PARTIAL MASTECTOMY WITH NEEDLE LOCALIZATION Left 04/22/2016   Procedure: LEFT PARTIAL MASTECTOMY WITH DOUBLE NEEDLE LOCALIZATION REEXCISION INFERIOR AND LATERAL MARGIN ADJACENT TISSUE TRANSFER;  Surgeon: [name], MD;  Location: [place];  Service: General;  Laterality: Left;   TONSILLECTOMY     TOTAL HIP ARTHROPLASTY Bilateral 01/14/2021   Procedure: TOTAL HIP ARTHROPLASTY ANTERIOR APPROACH WITH LEFT KNEE CORTISONE INJECTION;  Surgeon: [name], MD;  [facility];  Service: Orthopedics;  Laterality: Bilateral;   Patient Active Problem List   Diagnosis Date Noted   Primary osteoarthritis of right hip 01/14/2021   Status post total hip replacement, right 01/14/2021   Malignant neoplasm  of upper-outer quadrant of left breast in female, estrogen receptor positive ([place]) 03/27/2016   Increased frequency of urination 01/31/2014   Allergic rhinitis due to pollen 12/30/2011   Hx of herpes zoster 12/30/2011   Leukocytopenia 12/30/2011   Malignant neoplasm of colon ([place]) 12/30/2011   Symptoms involving cardiovascular system 09/98/3382   Uncomplicated varicose veins 12/30/2011   Vitamin D deficiency 12/30/2011   HEMORRHOIDS-EXTERNAL 11/08/2008   HIATAL HERNIA 02/16/2007   Osteoporosis 02/16/2007   History of malignant neoplasm of large intestine 02/16/2007   Hiatal hernia 02/16/2007    PCP: Eldridge Abrahams  REFERRING PROVIDER: Eldridge Abrahams  REFERRING DIAG: falls, gait instability  THERAPY DIAG:  Abnormal posture  Muscle weakness (generalized)  Other lack of coordination  Difficulty in walking, not elsewhere classified  Unsteadiness on feet  Rationale for Evaluation and Treatment: Rehabilitation  ONSET DATE: 07/15/22  SUBJECTIVE:   SUBJECTIVE STATEMENT: Was sick over christmas through january.   PERTINENT HISTORY: See above PAIN:  Are you having pain? No  PRECAUTIONS: None and Other:    WEIGHT BEARING RESTRICTIONS: No  FALLS:  Has patient fallen in last 6 months? No and has had some times off balance  LIVING ENVIRONMENT: Lives with: lives alone Lives in: House/apartment Stairs: No Has following equipment at home: None  OCCUPATION: retired  PLOF:  Independent and does her own housework and yardwork  PATIENT GOALS: better balance, get up easier from chair  NEXT MD VISIT:   OBJECTIVE:   COGNITION: Overall cognitive status: Within functional limits for tasks assessed     SENSATION: WFL POSTURE: rounded shoulders, forward head, decreased lumbar lordosis, and increased thoracic kyphosis  PALPATION: Non tender, fair mm tone  LOWER EXTREMITY ROM:  WFL, some crepitus in the left knee   LOWER EXTREMITY MMT:  MMT Right eval Left eval  Hip  flexion 4- 4-  Hip extension    Hip abduction    Hip adduction    Hip internal rotation    Hip external rotation    Knee flexion 4- 4-  Knee extension 4- 4-  Ankle dorsiflexion 4- 4-  Ankle plantarflexion    Ankle inversion    Ankle eversion     (Blank rows = not tested)  FUNCTIONAL TESTS:  5 times sit to stand: 24 Timed up and go (TUG): 16 off balance when first few steps  BERG 38/56  3 years ago she scored 49/56 GAIT: Distance walked: 100' Assistive device utilized: None Level of assistance: Complete Independence Comments: a little unsteady as she starts sometimes off to the side but mostly can correct herself   TODAY'S TREATMENT:                                                                                                                              DATE:   10/21/22:   Nustep Level 5 6 min TUG, BERG Seated theraband B shoulder rows and shoulder ext, green band 15 reps, encouraged to continue at home Standing at counter for heel/toe raises 15reps  10/07/22 NuStep L5 x 7 min LAQ 3lb 2x10 Seated March 3lb 2x10  Sit to stand 2x10  Rows and Ext green 2x10 Heel raises 2x10  09/09/22 NuStep L4 x 5 min Hamstring curls green x 10 each LAQ 3lb x 10 each  Side steps over WaTE bar Side step on and off airex From airex 6 in alt box taps 20lb gait all directions resisted x3  09/02/22 NuStep L5 x 6 min  20lb gait all directions resisted x3 Alt 4 in box taps x10 Alt 6 in box taps x10 S2S on airex 2x5 ALT 6 in taps from airex 2x10 Step up at stairs 4 & 6 inch x10   08/26/22 NuStep L5 x 5 minutes Lower body mobilization on physiodisc, required mod VC to move from lower body. Mini squats, 2 x 10, VC to maintain knees at shoulder width apart and to educate her to lean forward to engage hips. Seated clamshells with Red Tband resistance, 2 x 10 reps Stand at counter, quick steps side to side, heel raises, quick steps forward and back, 2 x 10 each, with UE  support.  08/19/22 NuStep L4 x 6 min LAQ 2lb 2x10  Hamstring curls red 2x10  Alt 4 in box taps 2x10 Backwards  walking  Side steps  Standing on airex reaching outside BOS On airex standing march      PATIENT EDUCATION:  Education details: HEP for start of balance, 3 way kick, march and one legged stance with support Person educated: Patient Education method: Consulting civil engineer, Demonstration, Verbal cues, and Handouts Education comprehension: verbalized understanding  HOME EXERCISE PROGRAM: As above  [id number]  ASSESSMENT:  CLINICAL IMPRESSION:  Patient  OBJECTIVE IMPAIRMENTS: Abnormal gait, cardiopulmonary status limiting activity, decreased activity tolerance, decreased balance, decreased mobility, difficulty walking, decreased ROM, and decreased strength.   REHAB POTENTIAL: Good  CLINICAL DECISION MAKING: Stable/uncomplicated  EVALUATION COMPLEXITY: Low   GOALS: Goals reviewed with patient? Yes  SHORT TERM GOALS: Target date:08/19/22 Independent with initial HEP Goal status :MET  LONG TERM GOALS: Target date: 10/28/22  Decrease TUG to 12 seconds 10/21/22:  8.67sec Goal status: MET  2.  Increase Berg balance score to 46/56 10/21/22: 50/56 Goal status: MET  3.  Get up from sitting without hands 10/21/22:  unable  Goal status: NOT MET  4.  Decrease 5XSTS to 19 seconds 10/21/22 10.44 sec Goal status: MET  5.  Independent with advanced HEP 10/21/22:  reviewed today, Baseline:  Goal status: MET  PLAN:  PT FREQUENCY: DC today PT DURATION: DC today PLANNED INTERVENTIONS: Therapeutic exercises, Therapeutic activity, Neuromuscular re-education, Balance training, Gait training, Patient/Family education, Self Care, Joint mobilization, Visual/preceptual remediation/compensation, and Manual therapy  PLAN FOR NEXT SESSION: DC today   Ethel Rana DPT 10/21/22 4:12 PM  10/21/2022, 4:12 PM

## 2022-11-04 ENCOUNTER — Other Ambulatory Visit: Payer: Self-pay | Admitting: Nurse Practitioner

## 2022-11-04 DIAGNOSIS — Z1231 Encounter for screening mammogram for malignant neoplasm of breast: Secondary | ICD-10-CM

## 2022-11-26 ENCOUNTER — Ambulatory Visit: Payer: Medicare Other | Admitting: Obstetrics and Gynecology

## 2022-12-11 ENCOUNTER — Other Ambulatory Visit (HOSPITAL_COMMUNITY)
Admission: RE | Admit: 2022-12-11 | Discharge: 2022-12-11 | Disposition: A | Discharge status: Home / Self Care | Payer: Medicare Other | Source: Ambulatory Visit | Attending: Obstetrics and Gynecology | Admitting: Obstetrics and Gynecology

## 2022-12-11 ENCOUNTER — Encounter: Payer: Self-pay | Admitting: Obstetrics and Gynecology

## 2022-12-11 ENCOUNTER — Ambulatory Visit (INDEPENDENT_AMBULATORY_CARE_PROVIDER_SITE_OTHER): Payer: Medicare Other | Admitting: Obstetrics and Gynecology

## 2022-12-11 VITALS — BP 160/79 | HR 67 | Ht 61.93 in | Wt 144.0 lb

## 2022-12-11 DIAGNOSIS — R350 Frequency of micturition: Secondary | ICD-10-CM | POA: Diagnosis not present

## 2022-12-11 DIAGNOSIS — N8110 Cystocele, unspecified: Secondary | ICD-10-CM

## 2022-12-11 DIAGNOSIS — R82998 Other abnormal findings in urine: Secondary | ICD-10-CM | POA: Diagnosis present

## 2022-12-11 DIAGNOSIS — N3281 Overactive bladder: Secondary | ICD-10-CM

## 2022-12-11 DIAGNOSIS — N393 Stress incontinence (female) (male): Secondary | ICD-10-CM

## 2022-12-11 DIAGNOSIS — N993 Prolapse of vaginal vault after hysterectomy: Secondary | ICD-10-CM

## 2022-12-11 LAB — POCT URINALYSIS DIPSTICK
Bilirubin, UA: NEGATIVE
Blood, UA: NEGATIVE
Glucose, UA: NEGATIVE
Ketones, UA: NEGATIVE
Nitrite, UA: NEGATIVE
Protein, UA: NEGATIVE
Spec Grav, UA: 1.02 (ref 1.010–1.025)
Urobilinogen, UA: 0.2 E.U./dL
pH, UA: 7.5 (ref 5.0–8.0)

## 2022-12-11 NOTE — Progress Notes (Addendum)
[facility]gyne[facility] New Patient Evaluation and Consultation  Referring Provider: Berkley Harvey, NP PCP: Berkley Harvey, NP Date of Service: 12/11/2022  SUBJECTIVE Chief Complaint: New Patient (Initial Visit) Teresa Boyd is a 89 y.o. female is here for vaginal prolapse and pessary check.)  History of Present Illness: Teresa Boyd is a 89 y.o. White or Caucasian female presenting for evaluation of prolapse and incontinence.    Review of records:  Has a cube pessary in place from [facility]. Noted stage II apical prolapse.   Urinary Symptoms: Leaks urine with going from sitting to standing, with urgency, and while asleep Leaks mostly with standing. She reports that she never had leakage prior to the pessary.  Pad use: 2 pads per day- uses thicker one at night.   She is not bothered by her UI symptoms.  Day time voids- every 30 min.  Nocturia: every hour at night to void. Voiding dysfunction: she does not empty her bladder well.  does not use a catheter to empty bladder.  When urinating, she feels a weak stream Drinks: decaf iced tea, 1.5 cups coffee in AM, very little water per day  UTIs:  0  UTI's in the last year.   Denies history of blood in urine and kidney or bladder stones  Pelvic Organ Prolapse Symptoms:                  She Denies a feeling of a bulge the vaginal area.  She has a cube pessary in since September.  The cord from the pessary has been bothersome at times. But she pushes it back up in place.  She is using estrogen cream 3 times per week.   Bowel Symptom: Bowel movements: 1 time(s) per day Stool consistency: soft  Straining: no.  Splinting: no.  Incomplete evacuation: no.  She Denies accidental bowel leakage / fecal incontinence Bowel regimen: stool softener  Sexual Function Sexually active: no.    Pelvic Pain Denies pelvic pain    Past Medical History:  Past Medical History:  Diagnosis Date   Arthritis    Breast cancer  ([place]) 2017   left breast   Colon cancer ([place]) 1994   History of radiation therapy 06/16/16- 07/13/16   Left Breast   Hx of adenomatous colonic polyps    Personal history of radiation therapy    PONV (postoperative nausea and vomiting)    nausea only   Varicose vein      Past Surgical History:   Past Surgical History:  Procedure Laterality Date   ABDOMINAL HYSTERECTOMY     BREAST BIOPSY Right    BREAST BIOPSY Right    BREAST LUMPECTOMY Left 2017   COLON RESECTION     sigmoid   COLON SURGERY  1994   colon cancer   COLONOSCOPY W/ POLYPECTOMY  last 02/18/11   multiple, prior colon cancer and polyps, 7 mm adenoma 2012   OVARIAN CYST SURGERY     PARTIAL MASTECTOMY WITH NEEDLE LOCALIZATION Left 04/22/2016   Procedure: LEFT PARTIAL MASTECTOMY WITH DOUBLE NEEDLE LOCALIZATION REEXCISION INFERIOR AND LATERAL MARGIN ADJACENT TISSUE TRANSFER;  Surgeon: [name], MD;  Location: [place];  Service: General;  Laterality: Left;   TONSILLECTOMY     TOTAL HIP ARTHROPLASTY Bilateral 01/14/2021   Procedure: TOTAL HIP ARTHROPLASTY ANTERIOR APPROACH WITH LEFT KNEE CORTISONE INJECTION;  Surgeon: [name], MD;  [facility];  Service: Orthopedics;  Laterality: Bilateral;     Past OB/GYN  History: OB History  Gravida Para Term Preterm AB Living  1 1   1   1   SAB IAB Ectopic Multiple Live Births          1    # Outcome Date GA Lbr Len/2nd Weight Sex Delivery Anes PTL Lv  1 Preterm             Obstetric Comments  Vaginal delivery    S/p hysterectomy   Medications: She has a current medication list which includes the following prescription(s): anastrozole, calcitonin (salmon), calcium carb-cholecalciferol, cetirizine, vitamin d3, diphenhydramine-acetaminophen, and pramipexole.   Allergies: Patient is allergic to sotradecol [sodium tetradecyl sulfate], celebrex [celecoxib], clindamycin, conjugated estrogens, estradiol benzoate, estrogens conjugated, hypaque-m  [diatrizone sodium-diatrizone meglumine], penicillins, propoxyphene, and tramadol.   Social History:  Social History   Tobacco Use   Smoking status: Never   Smokeless tobacco: Never  Vaping Use   Vaping Use: Never used  Substance Use Topics   Alcohol use: No   Drug use: No    Relationship status: widowed She lives alone She is employed- Emergency planning/management officer. Regular exercise: No History of abuse: No  Family History:   Family History  Problem Relation Age of Onset   Colon cancer Mother 44   Colon polyps Father 75   Cancer Brother    Pancreatic cancer Neg Hx    Rectal cancer Neg Hx    Stomach cancer Neg Hx      Review of Systems: Review of Systems  Constitutional:  Negative for fever, malaise/fatigue and weight loss.  Respiratory:  Negative for cough, shortness of breath and wheezing.   Cardiovascular:  Negative for chest pain, palpitations and leg swelling.  Gastrointestinal:  Negative for abdominal pain and blood in stool.  Genitourinary:  Negative for dysuria.  Musculoskeletal:  Negative for myalgias.  Skin:  Negative for rash.  Neurological:  Negative for dizziness and headaches.  Endo/Heme/Allergies:  Bruises/bleeds easily.  Psychiatric/Behavioral:  Negative for depression. The patient is not nervous/anxious.      OBJECTIVE Physical Exam: Vitals:   12/11/22 0844 12/11/22 0855  BP: (!) 150/85 (!) 160/79  Pulse: 67 67  Weight: 144 lb (65.3 kg)   Height: 5' 1.93" (1.573 m)     Physical Exam Constitutional:      General: She is not in acute distress. Pulmonary:     Effort: Pulmonary effort is normal.  Abdominal:     General: There is no distension.     Palpations: Abdomen is soft.     Tenderness: There is no abdominal tenderness. There is no rebound.  Musculoskeletal:        General: No swelling. Normal range of motion.  Skin:    General: Skin is warm and dry.     Findings: No rash.  Neurological:     Mental Status: She is alert and oriented  to person, place, and time.  Psychiatric:        Mood and Affect: Mood normal.        Behavior: Behavior normal.      GU / Detailed Urogynecologic Evaluation:  Pelvic Exam: Normal external female genitalia; Bartholin's and Skene's glands normal in appearance; urethral meatus normal in appearance, no urethral masses or discharge.   CST: positive  Pessary removed. #0 cube pessary s/p hysterectomy: Speculum exam reveals normal vaginal mucosa with  atrophy and normal vaginal cuff.  Adnexa no mass, fullness, tenderness.   Pessary cleaned and replaced  Pelvic floor strength I/V  Pelvic floor musculature:  Right levator non-tender, Right obturator non-tender, Left levator non-tender, Left obturator non-tender  POP-Q:   POP-Q  -1                                            Aa   -1                                           Ba  -3.5                                              C   3                                            Gh  4                                            Pb  5.5                                            tvl   -2.5                                            Ap  -2.5                                            Bp                                                 D      Rectal Exam:  Normal external rectum  Post-Void Residual (PVR) by Bladder Scan: In order to evaluate bladder emptying, we discussed obtaining a postvoid residual and she agreed to this procedure.  Procedure: The ultrasound unit was placed on the patient's abdomen in the suprapubic region after the patient had voided. A PVR of 2 ml was obtained by bladder scan.  Laboratory Results: POC urine: moderate leukocytes   ASSESSMENT AND PLAN Ms. Hodgins is a 89 y.o. with:  1. Prolapse of anterior vaginal wall   2. Vaginal vault prolapse after hysterectomy   3. SUI (stress urinary incontinence, female)   4. Leukocytes in urine   5. Overactive bladder   6. Urinary frequency    Stage II  anterior, Stage I posterior, Stage I apical prolapse - She is doing well with the cube pessary, will continue - Will have her return for cleanings q3 months - She should continue  with estrace cream 3 times per week.   2. SUI - For treatment of stress urinary incontinence,  non-surgical options include expectant management, weight loss, physical therapy, as well as a pessary.  Surgical options include a midurethral sling, and transurethral injection of a bulking agent. - She wants to try physical therapy first or may be interested in urethral bulking procedure in office. Referral placed for PT and handout provided about bulking.   3. Leukocytes in urine - will send for culture  4. OAB - We discussed the symptoms of overactive bladder (OAB), which include urinary urgency, urinary frequency, nocturia, with or without urge incontinence.  While we do not know the exact etiology of OAB, several treatment options exist. We discussed management including behavioral therapy (decreasing bladder irritants, urge suppression strategies, timed voids, bladder retraining), physical therapy, medication; for refractory cases posterior tibial nerve stimulation, sacral neuromodulation, and intravesical botulinum toxin injection.  -She will start with reducing caffeine intake and drinking more water.    Jaquita Folds, MD

## 2022-12-11 NOTE — Patient Instructions (Signed)

## 2022-12-12 LAB — URINE CULTURE

## 2022-12-22 ENCOUNTER — Ambulatory Visit
Admission: RE | Admit: 2022-12-22 | Discharge: 2022-12-22 | Disposition: A | Discharge status: Home / Self Care | Payer: Medicare Other | Source: Ambulatory Visit | Attending: Nurse Practitioner | Admitting: Nurse Practitioner

## 2022-12-22 DIAGNOSIS — Z1231 Encounter for screening mammogram for malignant neoplasm of breast: Secondary | ICD-10-CM

## 2023-01-26 ENCOUNTER — Encounter: Payer: Self-pay | Admitting: Physical Therapy

## 2023-01-26 ENCOUNTER — Other Ambulatory Visit: Payer: Self-pay

## 2023-01-26 ENCOUNTER — Ambulatory Visit: Payer: Medicare Other | Attending: Obstetrics and Gynecology | Admitting: Physical Therapy

## 2023-01-26 DIAGNOSIS — R279 Unspecified lack of coordination: Secondary | ICD-10-CM

## 2023-01-26 DIAGNOSIS — R293 Abnormal posture: Secondary | ICD-10-CM | POA: Insufficient documentation

## 2023-01-26 DIAGNOSIS — M6281 Muscle weakness (generalized): Secondary | ICD-10-CM | POA: Diagnosis present

## 2023-01-26 NOTE — Patient Instructions (Signed)

## 2023-01-26 NOTE — Therapy (Signed)
OUTPATIENT PHYSICAL THERAPY FEMALE PELVIC EVALUATION   Patient Name: Teresa Boyd MRN: [id number] DOB:1931/07/13, 89 y.o., female Today's Date: 01/26/2023  END OF SESSION:  PT End of Session - 01/26/23 1048     Visit Number 1    Date for PT Re-Evaluation 04/28/23    Authorization Type [employer] MCR/ [employer]    Progress Note Due on Visit 10    PT Start Time 1100    PT Stop Time 1139    PT Time Calculation (min) 39 min             Past Medical History:  Diagnosis Date   Arthritis    Breast cancer (HCC) 2017   left breast   Colon cancer (HCC) 1994   History of radiation therapy 06/16/16- 07/13/16   Left Breast   Hx of adenomatous colonic polyps    Personal history of radiation therapy    PONV (postoperative nausea and vomiting)    nausea only   Varicose vein    Past Surgical History:  Procedure Laterality Date   ABDOMINAL HYSTERECTOMY     BREAST BIOPSY Right    BREAST BIOPSY Right    BREAST LUMPECTOMY Left 2017   COLON RESECTION     sigmoid   COLON SURGERY  1994   colon cancer   COLONOSCOPY W/ POLYPECTOMY  last 02/18/11   multiple, prior colon cancer and polyps, 7 mm adenoma 2012   OVARIAN CYST SURGERY     PARTIAL MASTECTOMY WITH NEEDLE LOCALIZATION Left 04/22/2016   Procedure: LEFT PARTIAL MASTECTOMY WITH DOUBLE NEEDLE LOCALIZATION REEXCISION INFERIOR AND LATERAL MARGIN ADJACENT TISSUE TRANSFER;  Surgeon: Claud Kelp, MD;  Location: [facility];  Service: General;  Laterality: Left;   TONSILLECTOMY     TOTAL HIP ARTHROPLASTY Bilateral 01/14/2021   Procedure: TOTAL HIP ARTHROPLASTY ANTERIOR APPROACH WITH LEFT KNEE CORTISONE INJECTION;  Surgeon: Jodi Geralds, MD;  Location: [facility];  Service: Orthopedics;  Laterality: Bilateral;   Patient Active Problem List   Diagnosis Date Noted   Primary osteoarthritis of right hip 01/14/2021   Status post total hip replacement, right 01/14/2021   Malignant neoplasm of upper-outer quadrant of left breast in female,  estrogen receptor positive (HCC) 03/27/2016   Increased frequency of urination 01/31/2014   Allergic rhinitis due to pollen 12/30/2011   Hx of herpes zoster 12/30/2011   Leukocytopenia 12/30/2011   Malignant neoplasm of colon (HCC) 12/30/2011   Symptoms involving cardiovascular system 12/30/2011   Uncomplicated varicose veins 12/30/2011   Vitamin D deficiency 12/30/2011   HEMORRHOIDS-EXTERNAL 11/08/2008   HIATAL HERNIA 02/16/2007   Osteoporosis 02/16/2007   History of malignant neoplasm of large intestine 02/16/2007   Hiatal hernia 02/16/2007    PCP: Iona Hansen NP  REFERRING PROVIDER: Marguerita Beards, MD  REFERRING DIAG: N39.3 (ICD-10-CM) - SUI (stress urinary incontinence, female)  THERAPY DIAG:  Abnormal posture - Plan: PT plan of care cert/re-cert  Muscle weakness (generalized) - Plan: PT plan of care cert/re-cert  Unspecified lack of coordination - Plan: PT plan of care cert/re-cert  Rationale for Evaluation and Treatment: Rehabilitation  ONSET DATE: since pessary placed  SUBJECTIVE:  SUBJECTIVE STATEMENT: Has a pessary for bladder prolapse stage 2 - since placement of this has leakage with transfers and not making in time to bathroom at night if she sleeps through urges. However this is not consistent "usually only one or twice a month".  Does use estrogen cream, 3x week Fluid intake: Yes: "not much" "very little"     PAIN:  Are you having pain? No   PRECAUTIONS: None  WEIGHT BEARING RESTRICTIONS: No  FALLS:  Has patient fallen in last 6 months? No  LIVING ENVIRONMENT: Lives with: lives alone Lives in: House/apartment   OCCUPATION: retired but maintains rentals   PLOF: Independent  PATIENT GOALS: to have to less leakage.   PERTINENT HISTORY:  Breast cancer,   Colon cancer , hysterectomy, stage 2 prolapse anterior and 1 posterior Sexual abuse: no   BOWEL MOVEMENT: Pain with bowel movement: No Type of bowel movement:Type (Bristol Stool Scale) 4, Frequency daily, and Strain No Fully empty rectum: Yes:   Leakage: No Pads: No Fiber supplement: Yes: stool softener   URINATION: Pain with urination: No Fully empty bladder: Yes: but squats to pee for public bathrooms and will have more empty with change of positions Stream: Weak Urgency: Yes:   Frequency: at home feels like she goes every 45 mins out or with with a task can wait a couple hours. At night about every hour to hour and a half. Sometimes wakes with leakage. Leakage:  at night if she sleeps hard and doesn't wake up with urges then will rush to the bathroom and not make it Pads: Yes: panty liners during the day and heavier at night   usually 2 liners a day and one heavy at night.   INTERCOURSE: Pain with intercourse:  not active   PREGNANCY: Vaginal deliveries 1 Tearing Yes: had stitches  C-section deliveries 0 Currently pregnant No  PROLAPSE: Not with pessary    OBJECTIVE:   DIAGNOSTIC FINDINGS:    COGNITION: Overall cognitive status: Within functional limits for tasks assessed     SENSATION: Light touch: Appears intact Proprioception: Appears intact  MUSCLE LENGTH: Bil hamstrings and adductors    POSTURE: rounded shoulders, forward head, posterior pelvic tilt, and flexed trunk   Gait: decreased step height bil, flexed trunk, decreased arm swing, decreased stride length, decreased cadence and impairments slightly more so at Lt leg   LUMBARAROM/PROM:  A/PROM A/PROM  eval  Flexion Limited by 25%  Extension WFL  Right lateral flexion Limited by 25%  Left lateral flexion Limited by 25%  Right rotation Limited by 25%  Left rotation Limited by 25%   (Blank rows = not tested)  LOWER EXTREMITY ROM:  WFL  LOWER EXTREMITY MMT:  Hip flexion 4/5, abduction and  adduction 4/5, ext 3+/5; knees 4+/5 PALPATION:   General  no TTP                External Perineal Exam pt deferred                              Internal Pelvic Floor pt deferred  Patient confirms identification and approves PT to assess internal pelvic floor and treatment No  PELVIC MMT:   MMT eval  Vaginal   Internal Anal Sphincter   External Anal Sphincter   Puborectalis   Diastasis Recti   (Blank rows = not tested)  Per chart review has 1/5 strength at recent urogyn appt  TONE: Pt deferred   PROLAPSE: Pt deferred  TODAY'S TREATMENT:                                                                                                                              DATE:   01/26/23 EVAL Examination completed, findings reviewed, pt educated on POC, HEP, and urge drill. Pt motivated to participate in PT and agreeable to attempt recommendations.  Extra time spent explaining HEP and urge drill for pt understanding and carry over. Pt deferred internal today as Dr. Florian Buff just checked this and it's listed and reports she is able to feel contractions of pelvic floor but agreeable to have internal assessment at later appt to re-check and make sure she is doing them correctly but not prepared today. Pt would benefit from additional PT to further address deficits.     PATIENT EDUCATION:  Education details: [phone] Person educated: Patient Education method: Explanation, Demonstration, Tactile cues, Verbal cues, and Handouts Education comprehension: verbalized understanding and returned demonstration  HOME EXERCISE PROGRAM: [id number]  ASSESSMENT:  CLINICAL IMPRESSION: Patient is a 89 y.o. female  who was seen today for physical therapy evaluation and treatment for urinary leakage status post pessary placed. Pt does use estrogen cream 3x/weekly and reports she doesn't feel pessary and it's comfortable however leakage started after having it placed. Pt states it doesn't happen all  the time only a couple times per month but would like it to improve if able. Pt found to have impaired posture, gait impairments, decreased core and hip strength, decrease flexibility in hips and spine. Pt deferred internal assessment today but agreeable for later date if needed. Pt reports she is able to do pelvic floor contractions in sitting, instructed by PT to attempt and states she is able to do this. Pt educated on benefits of internal assessment to insure she is doing contractions and relaxation correctly and pt understands and agreeable to later. Pt would benefit from additional PT to further address deficits.    OBJECTIVE IMPAIRMENTS: decreased coordination, decreased endurance, decreased mobility, difficulty walking, decreased strength, impaired flexibility, improper body mechanics, and postural dysfunction.   ACTIVITY LIMITATIONS: carrying, lifting, squatting, continence, and locomotion level  PARTICIPATION LIMITATIONS: shopping, community activity, and yard work  PERSONAL FACTORS: Age, Fitness, Time since onset of injury/illness/exacerbation, and 1 comorbidity: post menopausal   are also affecting patient's functional outcome.   REHAB POTENTIAL: Good  CLINICAL DECISION MAKING: Stable/uncomplicated  EVALUATION COMPLEXITY: Low   GOALS: Goals reviewed with patient? Yes  SHORT TERM GOALS: Target date: 02/23/23  Pt to be I with HEP.  Baseline: Goal status: INITIAL  2.  Pt to demonstrate at least 2/5 pelvic floor strength for improved pelvic stability and decreased strain at pelvic floor/ decrease leakage.  Baseline:  Goal status: INITIAL  3.  Pt to be I with breathing mechanics and pelvic floor coordination for pressure management to decreased strain at pelvic floor and decreased leakage.  Baseline:  Goal status: INITIAL  4.  Pt to report improved time between bladder voids to at least 2 hours for improved QOL with decreased urinary frequency.   Baseline:  Goal status:  INITIAL  LONG TERM GOALS: Target date: 04/28/23  Pt to be I with advanced HEP.  Baseline:  Goal status: INITIAL  2.  Pt to demonstrate at least 3/5 pelvic floor strength for improved pelvic stability and decreased strain at pelvic floor/ decrease leakage.  Baseline:  Goal status: INITIAL  3.  Pt to report improved time between bladder voids to at least 3 hours for improved QOL with decreased urinary frequency.   Baseline:  Goal status: INITIAL  4.  Pt to demonstrate improved coordination of pelvic floor and breathing mechanics with 10# squat with appropriate synergistic patterns to decrease pain and leakage at least 75% of the time.    Baseline:  Goal status: INITIAL   PLAN:  PT FREQUENCY: every other week  PT DURATION:  6 sessions  PLANNED INTERVENTIONS: Therapeutic exercises, Therapeutic activity, Neuromuscular re-education, Patient/Family education, Self Care, Joint mobilization, Aquatic Therapy, Dry Needling, Spinal mobilization, Cryotherapy, Moist heat, Taping, Biofeedback, and Manual therapy  PLAN FOR NEXT SESSION: internal if needed and pt consents, hip and core strengthening, coordination of pelvic floor and breathing, pressure management, lifting mechanics    Otelia Sergeant, PT, DPT 01/26/2411:12 PM

## 2023-03-08 ENCOUNTER — Ambulatory Visit: Payer: Medicare Other | Attending: Obstetrics and Gynecology | Admitting: Physical Therapy

## 2023-03-08 DIAGNOSIS — M6281 Muscle weakness (generalized): Secondary | ICD-10-CM | POA: Diagnosis present

## 2023-03-08 DIAGNOSIS — R293 Abnormal posture: Secondary | ICD-10-CM | POA: Insufficient documentation

## 2023-03-08 DIAGNOSIS — R279 Unspecified lack of coordination: Secondary | ICD-10-CM | POA: Diagnosis present

## 2023-03-08 NOTE — Therapy (Signed)
OUTPATIENT PHYSICAL THERAPY FEMALE PELVIC EVALUATION   Patient Name: Teresa Boyd MRN: [id number] DOB:06-Nov-1930, 89 y.o., female Today's Date: 03/08/2023  END OF SESSION:  PT End of Session - 03/08/23 1404     Visit Number 2    Date for PT Re-Evaluation 04/28/23    Authorization Type [employer] MCR/ [employer]    Progress Note Due on Visit 10    PT Start Time 1400    PT Stop Time 1440    PT Time Calculation (min) 40 min    Activity Tolerance Patient tolerated treatment well    Behavior During Therapy [facility] for tasks assessed/performed             Past Medical History:  Diagnosis Date   Arthritis    Breast cancer (HCC) 2017   left breast   Colon cancer (HCC) 1994   History of radiation therapy 06/16/16- 07/13/16   Left Breast   Hx of adenomatous colonic polyps    Personal history of radiation therapy    PONV (postoperative nausea and vomiting)    nausea only   Varicose vein    Past Surgical History:  Procedure Laterality Date   ABDOMINAL HYSTERECTOMY     BREAST BIOPSY Right    BREAST BIOPSY Right    BREAST LUMPECTOMY Left 2017   COLON RESECTION     sigmoid   COLON SURGERY  1994   colon cancer   COLONOSCOPY W/ POLYPECTOMY  last 02/18/11   multiple, prior colon cancer and polyps, 7 mm adenoma 2012   OVARIAN CYST SURGERY     PARTIAL MASTECTOMY WITH NEEDLE LOCALIZATION Left 04/22/2016   Procedure: LEFT PARTIAL MASTECTOMY WITH DOUBLE NEEDLE LOCALIZATION REEXCISION INFERIOR AND LATERAL MARGIN ADJACENT TISSUE TRANSFER;  Surgeon: Claud Kelp, MD;  Location: [facility];  Service: General;  Laterality: Left;   TONSILLECTOMY     TOTAL HIP ARTHROPLASTY Bilateral 01/14/2021   Procedure: TOTAL HIP ARTHROPLASTY ANTERIOR APPROACH WITH LEFT KNEE CORTISONE INJECTION;  Surgeon: Jodi Geralds, MD;  Location: [facility];  Service: Orthopedics;  Laterality: Bilateral;   Patient Active Problem List   Diagnosis Date Noted   Primary osteoarthritis of right hip 01/14/2021   Status  post total hip replacement, right 01/14/2021   Malignant neoplasm of upper-outer quadrant of left breast in female, estrogen receptor positive (HCC) 03/27/2016   Increased frequency of urination 01/31/2014   Allergic rhinitis due to pollen 12/30/2011   Hx of herpes zoster 12/30/2011   Leukocytopenia 12/30/2011   Malignant neoplasm of colon (HCC) 12/30/2011   Symptoms involving cardiovascular system 12/30/2011   Uncomplicated varicose veins 12/30/2011   Vitamin D deficiency 12/30/2011   HEMORRHOIDS-EXTERNAL 11/08/2008   HIATAL HERNIA 02/16/2007   Osteoporosis 02/16/2007   History of malignant neoplasm of large intestine 02/16/2007   Hiatal hernia 02/16/2007    PCP: Iona Hansen NP  REFERRING PROVIDER: Marguerita Beards, MD  REFERRING DIAG: N39.3 (ICD-10-CM) - SUI (stress urinary incontinence, female)  THERAPY DIAG:  Abnormal posture  Muscle weakness (generalized)  Unspecified lack of coordination  Rationale for Evaluation and Treatment: Rehabilitation  ONSET DATE: since pessary placed  SUBJECTIVE:  SUBJECTIVE STATEMENT: Pt reports she is getting up 3x per night, now able go 2 hours between voids during the day. Leakage is still happening but much much lower amounts and less often. Now only having small few drops of urine randomly if waiting longer than 2-2.5 hours but this is infrequent now. Using the urge drill and really helpful.  No longer rushing to bathroom, urges are much more controllable with urge drill. Feels like most days she is completely dry.     Fluid intake: Yes: "not much" "very little"     PAIN:  Are you having pain? No   PRECAUTIONS: None  WEIGHT BEARING RESTRICTIONS: No  FALLS:  Has patient fallen in last 6 months? No  LIVING ENVIRONMENT: Lives with: lives  alone Lives in: House/apartment   OCCUPATION: retired but maintains rentals   PLOF: Independent  PATIENT GOALS: to have to less leakage.   PERTINENT HISTORY:  Breast cancer,  Colon cancer , hysterectomy, stage 2 prolapse anterior and 1 posterior Sexual abuse: no   BOWEL MOVEMENT: Pain with bowel movement: No Type of bowel movement:Type (Bristol Stool Scale) 4, Frequency daily, and Strain No Fully empty rectum: Yes:   Leakage: No Pads: No Fiber supplement: Yes: stool softener   URINATION: Pain with urination: No Fully empty bladder: Yes: but squats to pee for public bathrooms and will have more empty with change of positions Stream: Weak Urgency: Yes:   Frequency: at home feels like she goes every 45 mins out or with with a task can wait a couple hours. At night about every hour to hour and a half. Sometimes wakes with leakage. Leakage:  at night if she sleeps hard and doesn't wake up with urges then will rush to the bathroom and not make it Pads: Yes: panty liners during the day and heavier at night   usually 2 liners a day and one heavy at night.   INTERCOURSE: Pain with intercourse:  not active   PREGNANCY: Vaginal deliveries 1 Tearing Yes: had stitches  C-section deliveries 0 Currently pregnant No  PROLAPSE: Not with pessary    OBJECTIVE:   DIAGNOSTIC FINDINGS:    COGNITION: Overall cognitive status: Within functional limits for tasks assessed     SENSATION: Light touch: Appears intact Proprioception: Appears intact  MUSCLE LENGTH: Bil hamstrings and adductors    POSTURE: rounded shoulders, forward head, posterior pelvic tilt, and flexed trunk   Gait: decreased step height bil, flexed trunk, decreased arm swing, decreased stride length, decreased cadence and impairments slightly more so at Lt leg   LUMBARAROM/PROM:  A/PROM A/PROM  eval  Flexion Limited by 25%  Extension WFL  Right lateral flexion Limited by 25%  Left lateral flexion Limited  by 25%  Right rotation Limited by 25%  Left rotation Limited by 25%   (Blank rows = not tested)  LOWER EXTREMITY ROM:  WFL  LOWER EXTREMITY MMT:  Hip flexion 4/5, abduction and adduction 4/5, ext 3+/5; knees 4+/5 PALPATION:   General  no TTP                External Perineal Exam pt deferred                              Internal Pelvic Floor pt deferred  Patient confirms identification and approves PT to assess internal pelvic floor and treatment No  PELVIC MMT:   MMT eval  Vaginal   Internal Anal Sphincter  External Anal Sphincter   Puborectalis   Diastasis Recti   (Blank rows = not tested)  Per chart review has 1/5 strength at recent urogyn appt        TONE: Pt deferred   PROLAPSE: Pt deferred  TODAY'S TREATMENT:                                                                                                                              DATE:   03/08/23: Reviewed urge drill, pt denied questions about HEP 2x10 10# lifts of hand weight from chair weight to simulate picking up groceries out of trunk for proper pressure management and lifting mechanics.   DC planning   PATIENT EDUCATION:  Education details: [phone] Person educated: Patient Education method: Explanation, Demonstration, Tactile cues, Verbal cues, and Handouts Education comprehension: verbalized understanding and returned demonstration  HOME EXERCISE PROGRAM: [id number]  ASSESSMENT:  CLINICAL IMPRESSION: Patient presents for treatment today. Treatment focused on education and practicing pressure management, breathing mechanics, and education of voiding mechanics. Pt denied additional needs for PT at end of session and reports she feels like her symptoms have resolved and she plans to continue all recommendations. Pt pleased with progress would like to DC today, post treatment. Pt understands she would need referral for any future PT needs.   OBJECTIVE IMPAIRMENTS: decreased coordination,  decreased endurance, decreased mobility, difficulty walking, decreased strength, impaired flexibility, improper body mechanics, and postural dysfunction.   ACTIVITY LIMITATIONS: carrying, lifting, squatting, continence, and locomotion level  PARTICIPATION LIMITATIONS: shopping, community activity, and yard work  PERSONAL FACTORS: Age, Fitness, Time since onset of injury/illness/exacerbation, and 1 comorbidity: post menopausal   are also affecting patient's functional outcome.   REHAB POTENTIAL: Good  CLINICAL DECISION MAKING: Stable/uncomplicated  EVALUATION COMPLEXITY: Low   GOALS: Goals reviewed with patient? Yes  SHORT TERM GOALS: Target date: 02/23/23  Pt to be I with HEP.  Baseline: Goal status: MET  2.  Pt to demonstrate at least 2/5 pelvic floor strength for improved pelvic stability and decreased strain at pelvic floor/ decrease leakage.  Baseline:  Goal status: pt deferred internal assessment  3.  Pt to be I with breathing mechanics and pelvic floor coordination for pressure management to decreased strain at pelvic floor and decreased leakage.  Baseline:  Goal status: MET  4.  Pt to report improved time between bladder voids to at least 2 hours for improved QOL with decreased urinary frequency.   Baseline:  Goal status: MET  LONG TERM GOALS: Target date: 04/28/23  Pt to be I with advanced HEP.  Baseline:  Goal status: MET  2.  Pt to demonstrate at least 3/5 pelvic floor strength for improved pelvic stability and decreased strain at pelvic floor/ decrease leakage.  Baseline:  Goal status: pt deferred internal assessment  3.  Pt to report improved time between bladder voids to at least 3 hours for improved QOL with decreased urinary frequency.   Baseline:  Goal status: not met but up to 2-2.5 hours and pleased with this  4.  Pt to demonstrate improved coordination of pelvic floor and breathing mechanics with 10# squat with appropriate synergistic patterns to  decrease pain and leakage at least 75% of the time.    Baseline:  Goal status: MET   PLAN:  PT FREQUENCY: every other week  PT DURATION:  6 sessions  PLANNED INTERVENTIONS: Therapeutic exercises, Therapeutic activity, Neuromuscular re-education, Patient/Family education, Self Care, Joint mobilization, Aquatic Therapy, Dry Needling, Spinal mobilization, Cryotherapy, Moist heat, Taping, Biofeedback, and Manual therapy  PLAN FOR NEXT SESSION: internal if needed and pt consents, hip and core strengthening, coordination of pelvic floor and breathing, pressure management, lifting mechanics     PHYSICAL THERAPY DISCHARGE SUMMARY  Visits from Start of Care: 2  Current functional level related to goals / functional outcomes: Pt pleased with progress, denied additional needs   Remaining deficits: Reports getting up 3x per night but decreased greatly from every hour to hour and half prior to PT, minimal drops of urine leakage infrequently now per pt   Education / Equipment: HEP, urge drill   Patient agrees to discharge. Patient goals were met. Patient is being discharged due to being pleased with the current functional level.   Otelia Sergeant, PT, DPT 06/17/242:04 PM

## 2023-03-14 NOTE — Progress Notes (Unsigned)
[facility]   Subjective:     Chief Complaint: No chief complaint on file.  History of Present Illness: Teresa Boyd is a 89 y.o. female with stage II pelvic organ prolapse who presents for a pessary check. She is using a size *** cube pessary. The pessary has been working well and she has no complaints. She {ACTION; IS/IS ZOX:[id number]} using vaginal estrogen. She denies vaginal bleeding.  Past Medical History: Patient  has a past medical history of Arthritis, Breast cancer (HCC) (2017), Colon cancer (HCC) (1994), History of radiation therapy (06/16/16- 07/13/16), adenomatous colonic polyps, Personal history of radiation therapy, PONV (postoperative nausea and vomiting), and Varicose vein.   Past Surgical History: She  has a past surgical history that includes Colon resection; Ovarian cyst surgery; Abdominal hysterectomy; Tonsillectomy; Colonoscopy w/ polypectomy (last 02/18/11); Colon surgery (1994); Partial mastectomy with needle localization (Left, 04/22/2016); Breast lumpectomy (Left, 2017); Breast biopsy (Right); Breast biopsy (Right); and Total hip arthroplasty (Bilateral, 01/14/2021).   Medications: She has a current medication list which includes the following prescription(s): anastrozole, calcitonin (salmon), calcium carb-cholecalciferol, cetirizine, vitamin d3, diphenhydramine-acetaminophen, and pramipexole.   Allergies: Patient is allergic to sotradecol [sodium tetradecyl sulfate], celebrex [celecoxib], clindamycin, conjugated estrogens, estradiol benzoate, estrogens conjugated, hypaque-m [diatrizone sodium-diatrizone meglumine], penicillins, propoxyphene, and tramadol.   Social History: Patient  reports that she has never smoked. She has never used smokeless tobacco. She reports that she does not drink alcohol and does not use drugs.      Objective:    Physical Exam: There were no vitals taken for this visit. Gen: No apparent distress, A&O x 3. Detailed  Urogynecologic Evaluation:  Pelvic Exam: Normal external female genitalia; Bartholin's and Skene's glands normal in appearance; urethral meatus {urethra:24773}, no urethral masses or discharge. The pessary was noted to be {in place:24774}. It was removed and cleaned. Speculum exam revealed {vaginal lesions:24775} in the vagina. The pessary was replaced. It was comfortable to the patient and fit well.       No data to display          Laboratory Results: Urine dipstick shows: {ua dip:315374::"negative for all components"}.    Assessment/Plan:    Assessment: [name] is a 89 y.o. with {PFD symptoms:24771} here for a pessary check. She is doing well.  Plan: She will {pessary plan:24776}. She will continue to use {lubricant:24777}. She will follow-up in *** {days/wks/mos/yrs:310907} for a pessary check or sooner as needed.  All questions were answered.   Time Spent:

## 2023-03-15 ENCOUNTER — Ambulatory Visit (INDEPENDENT_AMBULATORY_CARE_PROVIDER_SITE_OTHER): Payer: Medicare Other | Admitting: Obstetrics and Gynecology

## 2023-03-15 ENCOUNTER — Encounter: Payer: Self-pay | Admitting: Obstetrics and Gynecology

## 2023-03-15 VITALS — BP 143/71 | HR 68

## 2023-03-15 DIAGNOSIS — N393 Stress incontinence (female) (male): Secondary | ICD-10-CM | POA: Diagnosis not present

## 2023-03-15 DIAGNOSIS — N993 Prolapse of vaginal vault after hysterectomy: Secondary | ICD-10-CM

## 2023-03-15 DIAGNOSIS — Z4689 Encounter for fitting and adjustment of other specified devices: Secondary | ICD-10-CM

## 2023-03-15 DIAGNOSIS — N8110 Cystocele, unspecified: Secondary | ICD-10-CM

## 2023-03-15 NOTE — Patient Instructions (Signed)
Use coconut oil or vaginal lubrication.

## 2023-03-22 ENCOUNTER — Encounter: Payer: Medicare Other | Admitting: Physical Therapy

## 2023-04-06 ENCOUNTER — Encounter: Payer: Medicare Other | Admitting: Physical Therapy

## 2023-04-20 ENCOUNTER — Encounter: Payer: Medicare Other | Admitting: Physical Therapy

## 2023-06-15 ENCOUNTER — Telehealth: Payer: Self-pay | Admitting: Obstetrics and Gynecology

## 2023-06-15 ENCOUNTER — Encounter: Payer: Self-pay | Admitting: Obstetrics and Gynecology

## 2023-06-15 ENCOUNTER — Ambulatory Visit (INDEPENDENT_AMBULATORY_CARE_PROVIDER_SITE_OTHER): Payer: Medicare Other | Admitting: Obstetrics and Gynecology

## 2023-06-15 VITALS — BP 153/82 | HR 73

## 2023-06-15 DIAGNOSIS — N8110 Cystocele, unspecified: Secondary | ICD-10-CM

## 2023-06-15 DIAGNOSIS — N952 Postmenopausal atrophic vaginitis: Secondary | ICD-10-CM

## 2023-06-15 DIAGNOSIS — N993 Prolapse of vaginal vault after hysterectomy: Secondary | ICD-10-CM

## 2023-06-15 MED ORDER — EC-RX DHEA 4 % EX CREA: 1.0000 | TOPICAL_CREAM | CUTANEOUS | 2 refills | Status: DC

## 2023-06-15 NOTE — Progress Notes (Signed)
[facility]   Subjective:     Chief Complaint: No chief complaint on file.  History of Present Illness: Teresa Boyd is a 89 y.o. female with stage II pelvic organ prolapse who presents for a pessary check. She is using a size #0 cube pessary. The pessary has been working well and she has no complaints. She is not using vaginal estrogen. She denies vaginal bleeding.  Past Medical History: Patient  has a past medical history of Arthritis, Breast cancer (HCC) (2017), Colon cancer (HCC) (1994), History of radiation therapy (06/16/16- 07/13/16), adenomatous colonic polyps, Personal history of radiation therapy, PONV (postoperative nausea and vomiting), and Varicose vein.   Past Surgical History: She  has a past surgical history that includes Colon resection; Ovarian cyst surgery; Abdominal hysterectomy; Tonsillectomy; Colonoscopy w/ polypectomy (last 02/18/11); Colon surgery (1994); Partial mastectomy with needle localization (Left, 04/22/2016); Breast lumpectomy (Left, 2017); Breast biopsy (Right); Breast biopsy (Right); and Total hip arthroplasty (Bilateral, 01/14/2021).   Medications: She has a current medication list which includes the following prescription(s): anastrozole, calcitonin (salmon), calcium carb-cholecalciferol, cetirizine, vitamin d3, diphenhydramine-acetaminophen, and pramipexole.   Allergies: Patient is allergic to sotradecol [sodium tetradecyl sulfate], celebrex [celecoxib], clindamycin, conjugated estrogens, estradiol benzoate, estrogens conjugated, hypaque-m [diatrizone sodium-diatrizone meglumine], penicillins, propoxyphene, and tramadol.   Social History: Patient  reports that she has never smoked. She has never used smokeless tobacco. She reports that she does not drink alcohol and does not use drugs.      Objective:    Physical Exam: BP (!) 153/82   Pulse 73  Gen: No apparent distress, A&O x 3. Detailed Urogynecologic Evaluation:  Pelvic Exam:  Normal external female genitalia; Bartholin's and Skene's glands normal in appearance; urethral meatus normal in appearance, no urethral masses or discharge. The pessary was noted to be in place. It was removed and cleaned. Speculum exam revealed erythema in the vagina. The pessary was replaced. It was comfortable to the patient and fit well.    Assessment/Plan:    Assessment: Teresa Boyd is a 89 y.o. with stage II pelvic organ prolapse here for a pessary check. She is doing well.  Plan: She will keep the pessary in place until next visit. She will start to use DHEA cream as she cannot use estrogen related to increased heart rate and symptomatic response. She will follow-up in 3 months for a pessary check or sooner as needed.  All questions were answered.

## 2023-06-15 NOTE — Telephone Encounter (Signed)
Pt called saying she needs to discuss a problem she discussed this morning that is still not resolved.  No other information.  Would like a call back.

## 2023-06-15 NOTE — Telephone Encounter (Signed)
Attempted to call patient and it says the VM is full and no longer accepting new messages.

## 2023-06-28 ENCOUNTER — Other Ambulatory Visit: Payer: Self-pay

## 2023-06-28 DIAGNOSIS — N952 Postmenopausal atrophic vaginitis: Secondary | ICD-10-CM

## 2023-06-28 MED ORDER — EC-RX DHEA 4 % EX CREA: 1.0000 | TOPICAL_CREAM | CUTANEOUS | 2 refills | Status: AC

## 2023-09-20 ENCOUNTER — Ambulatory Visit (INDEPENDENT_AMBULATORY_CARE_PROVIDER_SITE_OTHER): Payer: Medicare Other | Admitting: Obstetrics and Gynecology

## 2023-09-20 ENCOUNTER — Encounter: Payer: Self-pay | Admitting: Obstetrics and Gynecology

## 2023-09-20 VITALS — BP 155/65 | HR 69

## 2023-09-20 DIAGNOSIS — N812 Incomplete uterovaginal prolapse: Secondary | ICD-10-CM | POA: Diagnosis not present

## 2023-09-20 DIAGNOSIS — N362 Urethral caruncle: Secondary | ICD-10-CM

## 2023-09-20 DIAGNOSIS — N952 Postmenopausal atrophic vaginitis: Secondary | ICD-10-CM

## 2023-09-20 MED ORDER — ESTRADIOL 0.1 MG/GM VA CREA: 0.5000 g | TOPICAL_CREAM | VAGINAL | 11 refills | Status: DC

## 2023-09-20 NOTE — Patient Instructions (Signed)
Use the estrogen cream x2 weekly for the atrophy and the urethral caruncle.   We tried a different style of pessary today but it was coming out when you coughed.   The cube is in now and please try to use the estrogen cream x2 weekly at nighttime.   If you have problems please call the office and let us know.

## 2023-09-20 NOTE — Progress Notes (Signed)
[facility]   Subjective:     Chief Complaint:  Chief Complaint  Patient presents with   Pessary Check    Teresa Boyd is a 89 y.o. female is here for 3 month pessary check.   History of Present Illness: Teresa Boyd is a 89 y.o. female with stage II pelvic organ prolapse who presents for a pessary check. She is using a size #0 cube pessary. The pessary has been working well and she has no complaints. She is not using vaginal estrogen. She denies vaginal bleeding.  Past Medical History: Patient  has a past medical history of Arthritis, Breast cancer (HCC) (2017), Colon cancer (HCC) (1994), History of radiation therapy (06/16/16- 07/13/16), adenomatous colonic polyps, Personal history of radiation therapy, PONV (postoperative nausea and vomiting), and Varicose vein.   Past Surgical History: She  has a past surgical history that includes Colon resection; Ovarian cyst surgery; Abdominal hysterectomy; Tonsillectomy; Colonoscopy w/ polypectomy (last 02/18/11); Colon surgery (1994); Partial mastectomy with needle localization (Left, 04/22/2016); Breast lumpectomy (Left, 2017); Breast biopsy (Right); Breast biopsy (Right); and Total hip arthroplasty (Bilateral, 01/14/2021).   Medications: She has a current medication list which includes the following prescription(s): anastrozole, calcitonin (salmon), calcium carb-cholecalciferol, cetirizine, vitamin d3, diphenhydramine-acetaminophen, estradiol, pramipexole, and ec-rx dhea.   Allergies: Patient is allergic to sotradecol [sodium tetradecyl sulfate], celebrex [celecoxib], clindamycin, conjugated estrogens, estradiol benzoate, estrogens conjugated, hypaque-m [diatrizone sodium-diatrizone meglumine], penicillins, propoxyphene, and tramadol.   Social History: Patient  reports that she has never smoked. She has never used smokeless tobacco. She reports that she does not drink alcohol and does not use drugs.      Objective:     Physical Exam: BP (!) 155/65   Pulse 69  Gen: No apparent distress, A&O x 3. Detailed Urogynecologic Evaluation:  Pelvic Exam: Normal external female genitalia; Bartholin's and Skene's glands normal in appearance; urethral meatus normal in appearance, no urethral masses or discharge. The pessary was noted to be in place. It was removed and cleaned. Speculum exam revealed erythema in the vagina.   As patient reports that she is concerned about the cube getting turned we discussed trying a different pessary.  Today we tried a incontinence ring with support size 0 and size 1.  The size 0 pessary was expelled with Valsalva and cough and the size 1 pessary was too large for her urethra.   The size 0 cube pessary was replaced. It was comfortable to the patient and fit well.    Assessment/Plan:    Assessment: Teresa Boyd is a 89 y.o. with stage II pelvic organ prolapse here for a pessary check. She is doing well.  Plan: She will keep the pessary in place until next visit. She reports it was many years ago that she has a reaction to estrogen and is open to trying this again. Will send a prescription for estrogen cream to the pharmacy for her to use twice a week. She will follow-up in 3 months for a pessary check or sooner as needed.  All questions were answered.

## 2023-09-24 ENCOUNTER — Telehealth: Payer: Self-pay

## 2023-09-24 NOTE — Telephone Encounter (Signed)
 Can [facility] call [facility] and see if they have the script?

## 2023-09-24 NOTE — Telephone Encounter (Signed)
 Teresa Boyd is a 89 y.o. female called in requesting a call from [place] re: estradiol not available at [facility].

## 2023-09-28 NOTE — Telephone Encounter (Signed)
 Incoming fax today shows that pt has allergy to Estrogens. However upon review of chart and communication with K.Zuleta,NP. The Estradiol  we prescribed  is vaginal and patient has agreed top try it as this is more localized use of hormones. Information has been [facility]

## 2023-11-17 ENCOUNTER — Other Ambulatory Visit: Payer: Self-pay | Admitting: Nurse Practitioner

## 2023-11-17 DIAGNOSIS — Z1231 Encounter for screening mammogram for malignant neoplasm of breast: Secondary | ICD-10-CM

## 2023-12-13 ENCOUNTER — Encounter: Payer: Self-pay | Admitting: Oncology

## 2023-12-20 ENCOUNTER — Encounter: Payer: Self-pay | Admitting: Oncology

## 2023-12-20 ENCOUNTER — Encounter: Payer: Self-pay | Admitting: Obstetrics and Gynecology

## 2023-12-20 ENCOUNTER — Ambulatory Visit (INDEPENDENT_AMBULATORY_CARE_PROVIDER_SITE_OTHER): Payer: Medicare (Managed Care) | Admitting: Obstetrics and Gynecology

## 2023-12-20 VITALS — BP 144/74 | HR 70

## 2023-12-20 DIAGNOSIS — N3281 Overactive bladder: Secondary | ICD-10-CM

## 2023-12-20 DIAGNOSIS — N993 Prolapse of vaginal vault after hysterectomy: Secondary | ICD-10-CM

## 2023-12-20 DIAGNOSIS — N8110 Cystocele, unspecified: Secondary | ICD-10-CM

## 2023-12-20 DIAGNOSIS — N362 Urethral caruncle: Secondary | ICD-10-CM

## 2023-12-20 DIAGNOSIS — N812 Incomplete uterovaginal prolapse: Secondary | ICD-10-CM | POA: Diagnosis not present

## 2023-12-20 NOTE — Patient Instructions (Signed)
 Try to decrease the tea with lemon and the cup of coffee in the evening. The milk is good because it is not acidic.    Today we talked about ways to manage bladder urgency such as altering your diet to avoid irritative beverages and foods (bladder diet) as well as attempting to decrease stress and other exacerbating factors.    The Most Bothersome Foods* The Least Bothersome Foods*  Coffee - Regular & Decaf Tea - caffeinated Carbonated beverages - cola, non-colas, diet & caffeine-free Alcohols - Beer, Red Wine, White Wine, [address] - Grapefruit, [place], Orange, [employer] - Cranberry, Grapefruit, Orange, Pineapple Vegetables - Tomato & Tomato Products Flavor Enhancers - Hot peppers, Spicy foods, Chili, Horseradish, Vinegar, Monosodium glutamate (MSG) Artificial Sweeteners - NutraSweet, Sweet 'N Low, Equal (sweetener), Saccharin Ethnic foods - [place], [place], [place] food [employer] - low-fat & whole Fruits - Bananas, Blueberries, Honeydew melon, Pears, Raisins, Watermelon Vegetables - Broccoli, [address] Sprouts, Babb, Carrots, Cauliflower, [place], Cucumber, Mushrooms, Peas, Radishes, Squash, Zucchini, White potatoes, Sweet potatoes & yams Poultry - Chicken, Eggs, [place], [employer] - Beef, Diplomatic Services operational officer, Lamb Seafood - Shrimp, [place] fish, Salmon Grains - Oat, Rice Snacks - Pretzels, Popcorn  *Lenward Chancellor et al. Diet and its role in interstitial cystitis/bladder pain syndrome (IC/BPS) and comorbid conditions. [employer]. [employer] Int. 2012 Jan 11.

## 2023-12-20 NOTE — Progress Notes (Signed)
 [facility]   Subjective:     Chief Complaint:  No chief complaint on file.  History of Present Illness: Teresa Boyd is a 89 y.o. female with stage II pelvic organ prolapse who presents for a pessary check. She is using a size #0 cube pessary. The pessary has been working well for the most part but she reports she sometimes feels like she has to push it back up. She is using vaginal estrogen. She denies vaginal bleeding.  We tried a few other pessaries last visit and they were not comfortable for patient.   Past Medical History: Patient  has a past medical history of Arthritis, Breast cancer (HCC) (2017), Colon cancer (HCC) (1994), History of radiation therapy (06/16/16- 07/13/16), adenomatous colonic polyps, Personal history of radiation therapy, PONV (postoperative nausea and vomiting), and Varicose vein.   Past Surgical History: She  has a past surgical history that includes Colon resection; Ovarian cyst surgery; Abdominal hysterectomy; Tonsillectomy; Colonoscopy w/ polypectomy (last 02/18/11); Colon surgery (1994); Partial mastectomy with needle localization (Left, 04/22/2016); Breast lumpectomy (Left, 2017); Breast biopsy (Right); Breast biopsy (Right); and Total hip arthroplasty (Bilateral, 01/14/2021).   Medications: She has a current medication list which includes the following prescription(s): anastrozole, calcitonin (salmon), calcium carb-cholecalciferol, cetirizine, vitamin d3, diphenhydramine-acetaminophen, estradiol, pramipexole, and ec-rx dhea.   Allergies: Patient is allergic to sotradecol [sodium tetradecyl sulfate], celebrex [celecoxib], clindamycin, conjugated estrogens, estradiol benzoate, estrogens conjugated, hypaque-m [diatrizone sodium-diatrizone meglumine], penicillins, propoxyphene, and tramadol.   Social History: Patient  reports that she has never smoked. She has never used smokeless tobacco. She reports that she does not drink alcohol and does not  use drugs.      Objective:    Physical Exam: BP (!) 144/74   Pulse 70  Gen: No apparent distress, A&O x 3. Detailed Urogynecologic Evaluation:  Pelvic Exam: Normal external female genitalia; Bartholin's and Skene's glands normal in appearance; urethral meatus normal in appearance, no urethral masses or discharge. The pessary was noted to be in place. It was removed and cleaned. Speculum exam revealed no lesions in the vagina.     Assessment/Plan:    Assessment: Teresa Boyd is a 89 y.o. with stage II pelvic organ prolapse here for a pessary check. She is doing well.  Plan: She will keep the pessary in place until next visit. She will use estrogen cream twice weekly.Marland Kitchen She will follow-up in 3 months for a pessary check or sooner as needed.   We also discussed bladder irritants and not drinking coffee in the evenings as this may be causing some of her nocturia.   All questions were answered.

## 2024-01-10 ENCOUNTER — Ambulatory Visit: Payer: Medicare Other

## 2024-02-04 ENCOUNTER — Encounter: Payer: Self-pay | Admitting: Oncology

## 2024-02-11 ENCOUNTER — Ambulatory Visit: Payer: Medicare (Managed Care)

## 2024-03-06 ENCOUNTER — Encounter: Payer: Self-pay | Admitting: Oncology

## 2024-03-06 ENCOUNTER — Ambulatory Visit
Admission: RE | Admit: 2024-03-06 | Discharge: 2024-03-06 | Disposition: A | Discharge status: Home / Self Care | Payer: Medicare (Managed Care) | Source: Ambulatory Visit | Attending: Nurse Practitioner | Admitting: Nurse Practitioner

## 2024-03-06 DIAGNOSIS — Z1231 Encounter for screening mammogram for malignant neoplasm of breast: Secondary | ICD-10-CM

## 2024-03-14 ENCOUNTER — Telehealth: Payer: Self-pay

## 2024-03-14 NOTE — Telephone Encounter (Signed)
 Amy called the patient to have her come in sooner. She agreed to 120 tomorrow 03-14-2024

## 2024-03-14 NOTE — Telephone Encounter (Signed)
 Teresa Boyd is a 89 y.o. female who calls today for concerns with her size #0 cube pessary. She feels it has split. She has to keep pushing it back after each time she urinates. She has an upcoming appointment on Friday 03-17-24 but just wanted to update you on this new discovery. She denies any bleeding, pain or pinching, using her estrogen x2 a week as directed. Offered to move appointment up, this was declined due to transportation.

## 2024-03-15 ENCOUNTER — Encounter: Payer: Self-pay | Admitting: Obstetrics and Gynecology

## 2024-03-15 ENCOUNTER — Ambulatory Visit (INDEPENDENT_AMBULATORY_CARE_PROVIDER_SITE_OTHER): Payer: Medicare (Managed Care) | Admitting: Obstetrics and Gynecology

## 2024-03-15 VITALS — BP 135/84 | HR 72

## 2024-03-15 DIAGNOSIS — N812 Incomplete uterovaginal prolapse: Secondary | ICD-10-CM

## 2024-03-15 DIAGNOSIS — N8110 Cystocele, unspecified: Secondary | ICD-10-CM

## 2024-03-15 DIAGNOSIS — N993 Prolapse of vaginal vault after hysterectomy: Secondary | ICD-10-CM

## 2024-03-15 DIAGNOSIS — N362 Urethral caruncle: Secondary | ICD-10-CM

## 2024-03-15 NOTE — Progress Notes (Signed)
 [facility]   Subjective:     Chief Complaint:  Chief Complaint  Patient presents with   Follow-up    KEMPER HOCHMAN is a 89 y.o. female here today for pessary check.    History of Present Illness: JONATHON CASTELO is a 89 y.o. female with stage II pelvic organ prolapse who presents for a pessary check. She is using a size #0 cube pessary. The pessary has been coming down and she is having to push it back up when she urinate and reports she feels like the pessary has split. She is using vaginal estrogen. She denies vaginal bleeding.  Past Medical History: Patient  has a past medical history of Arthritis, Breast cancer (HCC) (2017), Colon cancer (HCC) (1994), History of radiation therapy (06/16/16- 07/13/16), adenomatous colonic polyps, Personal history of radiation therapy, PONV (postoperative nausea and vomiting), and Varicose vein.   Past Surgical History: She  has a past surgical history that includes Colon resection; Ovarian cyst surgery; Abdominal hysterectomy; Tonsillectomy; Colonoscopy w/ polypectomy (last 02/18/11); Colon surgery (1994); Partial mastectomy with needle localization (Left, 04/22/2016); Breast lumpectomy (Left, 2017); Breast biopsy (Right); Breast biopsy (Right); and Total hip arthroplasty (Bilateral, 01/14/2021).   Medications: She has a current medication list which includes the following prescription(s): anastrozole , calcitonin (salmon), calcium carb-cholecalciferol, cetirizine, vitamin d3, diphenhydramine -acetaminophen , estradiol , pramipexole, and ec-rx dhea.   Allergies: Patient is allergic to sotradecol [sodium tetradecyl sulfate], celebrex [celecoxib], clindamycin, conjugated estrogens, estradiol  benzoate, estrogens conjugated, hypaque-m [diatrizone sodium-diatrizone meglumine], penicillins, propoxyphene, and tramadol.   Social History: Patient  reports that she has never smoked. She has never used smokeless tobacco. She reports that she does not  drink alcohol and does not use drugs.      Objective:    Physical Exam: BP 135/84   Pulse 72  Gen: No apparent distress, A&O x 3. Detailed Urogynecologic Evaluation:  Pelvic Exam: Normal external female genitalia; Bartholin's and Skene's glands normal in appearance; urethral meatus with caruncle, no urethral masses or discharge. The pessary was noted to be in place. It was removed and cleaned. Speculum exam revealed no lesions in the vagina. The pessary was replaced with a #1 Short Stem Gellhorn. It was comfortable to the patient and fit well. She was able to urinate without difficulty.      Assessment/Plan:    Assessment: Ms. Giorgio is a 89 y.o. with stage II pelvic organ prolapse here for a pessary check. She is doing well.  Plan: She will keep the pessary in place until next visit. She will continue to use estrogen. She will follow-up in 3 months for a pessary check or sooner as needed.

## 2024-03-16 ENCOUNTER — Telehealth: Payer: Self-pay

## 2024-03-16 NOTE — Telephone Encounter (Signed)
 Patient called with concerns with her new pessary. She had a BM today and feels like she has to push it back. Please advise on this. Thank you

## 2024-03-17 ENCOUNTER — Ambulatory Visit: Payer: Medicare (Managed Care) | Admitting: Obstetrics and Gynecology

## 2024-03-17 NOTE — Telephone Encounter (Signed)
 Patient has been scheduled

## 2024-03-20 ENCOUNTER — Telehealth: Payer: Self-pay

## 2024-03-20 NOTE — Telephone Encounter (Signed)
 Patient called to cancel her appt stating the pessary I working for her and she thinks it's a good fit. She said she will call us  if she has any problems with it other than that she will keep her 3 month appt.

## 2024-03-21 ENCOUNTER — Ambulatory Visit: Payer: Medicare (Managed Care) | Admitting: Obstetrics and Gynecology

## 2024-06-08 ENCOUNTER — Encounter: Payer: Self-pay | Admitting: Oncology

## 2024-06-15 ENCOUNTER — Encounter: Payer: Self-pay | Admitting: Oncology

## 2024-06-15 ENCOUNTER — Ambulatory Visit: Payer: Medicare (Managed Care) | Admitting: Obstetrics and Gynecology

## 2024-06-15 VITALS — BP 138/82 | HR 68

## 2024-06-15 DIAGNOSIS — N393 Stress incontinence (female) (male): Secondary | ICD-10-CM

## 2024-06-15 DIAGNOSIS — N8110 Cystocele, unspecified: Secondary | ICD-10-CM

## 2024-06-15 DIAGNOSIS — N3281 Overactive bladder: Secondary | ICD-10-CM | POA: Diagnosis not present

## 2024-06-15 DIAGNOSIS — N812 Incomplete uterovaginal prolapse: Secondary | ICD-10-CM | POA: Diagnosis not present

## 2024-06-15 DIAGNOSIS — N993 Prolapse of vaginal vault after hysterectomy: Secondary | ICD-10-CM

## 2024-06-15 NOTE — Progress Notes (Signed)
 [name]   Subjective:     Chief Complaint:  Chief Complaint  Patient presents with   Pessary Check    Teresa Boyd is a 89 y.o. female is here for pessary change.   History of Present Illness: Teresa Boyd is a 89 y.o. female with stage II pelvic organ prolapse who presents for a pessary check. She is using a size #1 short stem gellhorn pessary. The pessary has been falling down when she tries to have a bowel movement. She is using vaginal estrogen. She denies vaginal bleeding.  Past Medical History: Patient  has a past medical history of Arthritis, Breast cancer (HCC) (2017), Colon cancer (HCC) (1994), History of radiation therapy (06/16/16- 07/13/16), adenomatous colonic polyps, Personal history of radiation therapy, PONV (postoperative nausea and vomiting), and Varicose vein.   Past Surgical History: She  has a past surgical history that includes Colon resection; Ovarian cyst surgery; Abdominal hysterectomy; Tonsillectomy; Colonoscopy w/ polypectomy (last 02/18/11); Colon surgery (1994); Partial mastectomy with needle localization (Left, 04/22/2016); Breast lumpectomy (Left, 2017); Breast biopsy (Right); Breast biopsy (Right); and Total hip arthroplasty (Bilateral, 01/14/2021).   Medications: She has a current medication list which includes the following prescription(s): anastrozole , calcitonin (salmon), calcium carb-cholecalciferol, cetirizine, vitamin d3, diphenhydramine -acetaminophen , estradiol , pramipexole, and ec-rx dhea.   Allergies: Patient is allergic to sotradecol [sodium tetradecyl sulfate], celebrex [celecoxib], clindamycin, conjugated estrogens, estradiol  benzoate, estrogens conjugated, hypaque-m [diatrizone sodium-diatrizone meglumine], penicillins, propoxyphene, and tramadol.   Social History: Patient  reports that she has never smoked. She has never used smokeless tobacco. She reports that she does not drink alcohol and does not use drugs.       Objective:    Physical Exam: BP 138/82   Pulse 68  Gen: No apparent distress, A&O x 3. Detailed Urogynecologic Evaluation:  Pelvic Exam: Normal external female genitalia; Bartholin's and Skene's glands normal in appearance; urethral meatus with caruncle, no urethral masses or discharge. The pessary was noted to be in place but with a harder valsalva the knob protrudes past the vaginal opening. It was removed and cleaned. Speculum exam revealed no lesions in the vagina. The pessary was replaced with a #2 Cube pessary ([id number]). It was comfortable to the patient and fit well. She felt like she was able to urinate more successfully and empty the bladder.    Assessment/Plan:    Assessment: Ms. Teresa Boyd is a 89 y.o. with stage II pelvic organ prolapse here for a pessary check. She is doing well.  Plan: She will keep the pessary in place until next visit. She will continue to use estrogen. She will follow-up in 3 months for a pessary check or sooner as needed.   We dicussed her OAB but at this time she does not wish to pursue further treatment. She said we can revisit this at her next visit for pessary check.   All questions were answered.

## 2024-06-16 ENCOUNTER — Encounter: Payer: Self-pay | Admitting: Obstetrics and Gynecology

## 2024-06-16 ENCOUNTER — Telehealth: Payer: Self-pay | Admitting: *Deleted

## 2024-06-16 ENCOUNTER — Ambulatory Visit (INDEPENDENT_AMBULATORY_CARE_PROVIDER_SITE_OTHER): Payer: Medicare (Managed Care) | Admitting: Obstetrics and Gynecology

## 2024-06-16 VITALS — BP 138/72 | HR 71

## 2024-06-16 DIAGNOSIS — N362 Urethral caruncle: Secondary | ICD-10-CM

## 2024-06-16 DIAGNOSIS — N813 Complete uterovaginal prolapse: Secondary | ICD-10-CM

## 2024-06-16 DIAGNOSIS — N8110 Cystocele, unspecified: Secondary | ICD-10-CM

## 2024-06-16 DIAGNOSIS — Z960 Presence of urogenital implants: Secondary | ICD-10-CM

## 2024-06-16 DIAGNOSIS — N993 Prolapse of vaginal vault after hysterectomy: Secondary | ICD-10-CM

## 2024-06-16 NOTE — Telephone Encounter (Signed)
 TC from pt.  Had new pessary inserted yesterday.  It fell down yesterday and she pushed it back up.  Feels like it might be twisted and rubbing tissue.  Requested to be seen.  Apt today at 1140.  Pt agreed KD  CMA

## 2024-06-16 NOTE — Progress Notes (Signed)
 [facility]   Subjective:     Chief Complaint:  Chief Complaint  Patient presents with   Pessary Check    Teresa Boyd is a 89 y.o. female is here for pessary check.   History of Present Illness: Teresa Boyd is a 89 y.o. female with stage II pelvic organ prolapse who presents for a pessary check. She is using a size #2 cube pessary. Patient reports she felt the string at her opening and thought she should push it back and she feels like she twisted it. And then she reports she had an accident where she completely wet herself and the bed.   Past Medical History: Patient  has a past medical history of Arthritis, Breast cancer (HCC) (2017), Colon cancer (HCC) (1994), History of radiation therapy (06/16/16- 07/13/16), adenomatous colonic polyps, Personal history of radiation therapy, PONV (postoperative nausea and vomiting), and Varicose vein.   Past Surgical History: She  has a past surgical history that includes Colon resection; Ovarian cyst surgery; Abdominal hysterectomy; Tonsillectomy; Colonoscopy w/ polypectomy (last 02/18/11); Colon surgery (1994); Partial mastectomy with needle localization (Left, 04/22/2016); Breast lumpectomy (Left, 2017); Breast biopsy (Right); Breast biopsy (Right); and Total hip arthroplasty (Bilateral, 01/14/2021).   Medications: She has a current medication list which includes the following prescription(s): anastrozole , calcitonin (salmon), calcium carb-cholecalciferol, cetirizine, vitamin d3, diphenhydramine -acetaminophen , estradiol , pramipexole, and ec-rx dhea.   Allergies: Patient is allergic to sotradecol [sodium tetradecyl sulfate], celebrex [celecoxib], clindamycin, conjugated estrogens, estradiol  benzoate, estrogens conjugated, hypaque-m [diatrizone sodium-diatrizone meglumine], penicillins, propoxyphene, and tramadol.   Social History: Patient  reports that she has never smoked. She has never used smokeless tobacco. She reports that she  does not drink alcohol and does not use drugs.      Objective:    Physical Exam: BP 138/72   Pulse 71  Gen: No apparent distress, A&O x 3. Detailed Urogynecologic Evaluation:  Pelvic Exam: Normal external female genitalia; Bartholin's and Skene's glands normal in appearance; urethral meatus with caruncle, no urethral masses or discharge. The pessary was noted to be in place but twisted sideways It was removed and cleaned. Speculum exam revealed no lesions in the vagina. The pessary was replaced with the string of the cube in the back. It was comfortable to the patient and fit well.     Assessment/Plan:    Assessment: Teresa Boyd is a 89 y.o. with stage III pelvic organ prolapse here for a pessary check. She is doing well.  Plan: She will keep the pessary in place until next visit. She will continue to use estrogen. She will follow-up in 3 months for a pessary check or sooner as needed.  All questions were answered.

## 2024-06-20 ENCOUNTER — Telehealth: Payer: Self-pay

## 2024-06-20 NOTE — Telephone Encounter (Signed)
 Patient called today with concerns regarding her urine output. When sitting on the toilet, she urinates and has to strain and the stream never stops, even after standing the stream is more a spray. No real UTI symptoms. She admits to some slight burning , but no other symptoms.

## 2024-07-25 ENCOUNTER — Encounter: Payer: Self-pay | Admitting: Obstetrics and Gynecology

## 2024-07-25 ENCOUNTER — Ambulatory Visit (INDEPENDENT_AMBULATORY_CARE_PROVIDER_SITE_OTHER): Payer: Medicare (Managed Care) | Admitting: Obstetrics and Gynecology

## 2024-07-25 ENCOUNTER — Other Ambulatory Visit (HOSPITAL_COMMUNITY)
Admission: RE | Admit: 2024-07-25 | Discharge: 2024-07-25 | Disposition: A | Discharge status: Home / Self Care | Payer: Medicare (Managed Care) | Source: Other Acute Inpatient Hospital | Attending: Obstetrics and Gynecology | Admitting: Obstetrics and Gynecology

## 2024-07-25 ENCOUNTER — Encounter: Payer: Self-pay | Admitting: Oncology

## 2024-07-25 VITALS — BP 186/84 | HR 69 | Ht 61.5 in | Wt 136.0 lb

## 2024-07-25 DIAGNOSIS — N393 Stress incontinence (female) (male): Secondary | ICD-10-CM

## 2024-07-25 DIAGNOSIS — Z960 Presence of urogenital implants: Secondary | ICD-10-CM

## 2024-07-25 DIAGNOSIS — N812 Incomplete uterovaginal prolapse: Secondary | ICD-10-CM | POA: Diagnosis not present

## 2024-07-25 DIAGNOSIS — N8110 Cystocele, unspecified: Secondary | ICD-10-CM

## 2024-07-25 DIAGNOSIS — R350 Frequency of micturition: Secondary | ICD-10-CM

## 2024-07-25 DIAGNOSIS — R82998 Other abnormal findings in urine: Secondary | ICD-10-CM

## 2024-07-25 DIAGNOSIS — N993 Prolapse of vaginal vault after hysterectomy: Secondary | ICD-10-CM

## 2024-07-25 LAB — URINALYSIS, COMPLETE (UACMP) WITH MICROSCOPIC
Bilirubin Urine: NEGATIVE
Glucose, UA: NEGATIVE mg/dL
Ketones, ur: NEGATIVE mg/dL
Nitrite: NEGATIVE
Protein, ur: NEGATIVE mg/dL
Specific Gravity, Urine: 1.013 (ref 1.005–1.030)
pH: 6 (ref 5.0–8.0)

## 2024-07-25 LAB — POC URINALSYSI DIPSTICK (AUTOMATED)
Bilirubin, UA: NEGATIVE
Glucose, UA: NEGATIVE
Ketones, UA: NEGATIVE
Nitrite, UA: NEGATIVE
Protein, UA: NEGATIVE
Spec Grav, UA: 1.02 (ref 1.010–1.025)
Urobilinogen, UA: 0.2 U/dL
pH, UA: 6.5 (ref 5.0–8.0)

## 2024-07-25 NOTE — Progress Notes (Signed)
 [facility]   Subjective:     Chief Complaint: Vaginal Bleeding (Possibly, about one week ago, just once, has since stopped; not sure if from pessary )  History of Present Illness: Teresa Boyd is a 89 y.o. female with stage II pelvic organ prolapse and stress incontinence who presents today for a pessary fitting. Previously has been using a #2 cube but reports this has not been comfortable and she feels she is having to push this up more frequently for support.    Past Medical History: Patient  has a past medical history of Arthritis, Breast cancer (HCC) (2017), Colon cancer (HCC) (1994), History of radiation therapy (06/16/16- 07/13/16), adenomatous colonic polyps, Personal history of radiation therapy, PONV (postoperative nausea and vomiting), and Varicose vein.   Past Surgical History: She  has a past surgical history that includes Colon resection; Ovarian cyst surgery; Abdominal hysterectomy; Tonsillectomy; Colonoscopy w/ polypectomy (last 02/18/11); Colon surgery (1994); Partial mastectomy with needle localization (Left, 04/22/2016); Breast lumpectomy (Left, 2017); Breast biopsy (Right); Breast biopsy (Right); and Total hip arthroplasty (Bilateral, 01/14/2021).   Medications: She has a current medication list which includes the following prescription(s): anastrozole , calcitonin (salmon), calcium carb-cholecalciferol, cetirizine, vitamin d3, diphenhydramine -acetaminophen , estradiol , pramipexole, and ec-rx dhea.   Allergies: Patient is allergic to sotradecol [sodium tetradecyl sulfate], celebrex [celecoxib], clindamycin, conjugated estrogens, estradiol  benzoate, estrogens conjugated, hypaque-m [diatrizone sodium-diatrizone meglumine], penicillins, propoxyphene, and tramadol.   Social History: Patient  reports that she has never smoked. She has never used smokeless tobacco. She reports that she does not drink alcohol and does not use drugs.      Objective:    BP (!)  186/84   Pulse 69   Ht 5' 1.5 (1.562 m)   Wt 136 lb (61.7 kg)   BMI 25.28 kg/m  Gen: No apparent distress, A&O x 3. Pelvic Exam: Normal external female genitalia; Bartholin's and Skene's glands normal in appearance; urethral meatus with caruncle, no urethral masses or discharge.   A size #2 Shattz pessary was fitted. It was comfortable, stayed in place with valsalva and was an appropriate size on examination, with one finger fitting between the pessary and the vaginal walls.   Patient reports it feels comfortable, but she is worried about her leakage. We discussed her leakage is related to the support of her prolapse and the unblocking of the urethra that allows her stress incontinence to get worse.   Lab Results  Component Value Date   COLORU yellow 07/25/2024   CLARITYU clear 07/25/2024   GLUCOSEUR Negative 07/25/2024   BILIRUBINUR neg 07/25/2024   KETONESU neg 07/25/2024   SPECGRAV 1.020 07/25/2024   RBCUR Trace 07/25/2024   PHUR 6.5 07/25/2024   PROTEINUR Negative 07/25/2024   UROBILINOGEN 0.2 07/25/2024   LEUKOCYTESUR Moderate (2+) (A) 07/25/2024     Assessment/Plan:    Assessment: Teresa Boyd is a 89 y.o. with stage II pelvic organ prolapse and stress incontinence who presents for a pessary fitting. Plan: She was fitted with a #2 Shaatz pessary. She will keep the pessary in place until next visit. She will use estrogen.   To address patient's SUI, we discussed options of urethral bulking and surgery. Patient does not wish to do surgery and would like to pursue urethral bulking with Dr. Marilynne.   Will send urine for culture to rule out underlying UTI.   Follow-up in 6 weeks for a pessary check or sooner as needed.  All questions were answered.    Dejai Schubach G Rileyann Florance, NP

## 2024-07-27 ENCOUNTER — Ambulatory Visit: Payer: Self-pay

## 2024-07-27 LAB — URINE CULTURE

## 2024-07-27 NOTE — Progress Notes (Signed)
 Patient has been notified

## 2024-08-07 ENCOUNTER — Telehealth: Payer: Self-pay

## 2024-08-07 ENCOUNTER — Encounter: Payer: Self-pay | Admitting: Oncology

## 2024-08-07 NOTE — Telephone Encounter (Signed)
 Sharene calls with complaints of her current pessary in place ( #2 [place] ). She states that it is not staying in place. With BMs it falls out. She also complains of increased urinary leaking. Advised to take it out and we would see her next week. Patient is scheduled for a follow up on 08-15-2024.

## 2024-08-15 ENCOUNTER — Ambulatory Visit: Payer: Medicare (Managed Care) | Admitting: Obstetrics and Gynecology

## 2024-08-22 ENCOUNTER — Encounter: Payer: Self-pay | Admitting: Oncology

## 2024-08-24 ENCOUNTER — Ambulatory Visit (INDEPENDENT_AMBULATORY_CARE_PROVIDER_SITE_OTHER): Payer: Medicare (Managed Care) | Admitting: Obstetrics and Gynecology

## 2024-08-24 ENCOUNTER — Encounter: Payer: Self-pay | Admitting: Oncology

## 2024-08-24 ENCOUNTER — Encounter: Payer: Self-pay | Admitting: Obstetrics and Gynecology

## 2024-08-24 VITALS — BP 176/82 | HR 70

## 2024-08-24 DIAGNOSIS — N393 Stress incontinence (female) (male): Secondary | ICD-10-CM

## 2024-08-24 DIAGNOSIS — N993 Prolapse of vaginal vault after hysterectomy: Secondary | ICD-10-CM

## 2024-08-24 DIAGNOSIS — R350 Frequency of micturition: Secondary | ICD-10-CM

## 2024-08-24 LAB — POCT URINALYSIS DIP (CLINITEK)
Bilirubin, UA: NEGATIVE
Glucose, UA: NEGATIVE mg/dL
Ketones, POC UA: NEGATIVE mg/dL
Leukocytes, UA: NEGATIVE
Nitrite, UA: NEGATIVE
POC PROTEIN,UA: NEGATIVE
Spec Grav, UA: 1.015 (ref 1.010–1.025)
Urobilinogen, UA: 0.2 U/dL
pH, UA: 8.5 — AB (ref 5.0–8.0)

## 2024-08-24 LAB — POCT URINE DIPSTICK
Bilirubin, UA: NEGATIVE
Glucose, UA: NEGATIVE mg/dL
Ketones, POC UA: NEGATIVE mg/dL
Leukocytes, UA: NEGATIVE
Nitrite, UA: NEGATIVE
POC PROTEIN,UA: NEGATIVE
Spec Grav, UA: 1.015 (ref 1.010–1.025)
Urobilinogen, UA: 0.2 U/dL
pH, UA: 8.5 — AB (ref 5.0–8.0)

## 2024-08-24 MED ORDER — CIPROFLOXACIN HCL 500 MG PO TABS
500.0000 mg | ORAL_TABLET | Freq: Once | ORAL | Status: AC
  Administered 2024-08-24: 500 mg via ORAL

## 2024-08-24 MED ORDER — LIDOCAINE HCL URETHRAL/MUCOSAL 2 % EX GEL
1.0000 | Freq: Once | CUTANEOUS | Status: AC
  Administered 2024-08-24: 1 via URETHRAL

## 2024-08-24 MED ORDER — LIDOCAINE HCL 2 % IJ SOLN
6.0000 mL | Freq: Once | INTRAMUSCULAR | Status: AC
  Administered 2024-08-24: 120 mg

## 2024-08-24 NOTE — Progress Notes (Signed)
 [facility] Return Visit  SUBJECTIVE  History of Present Illness: Teresa Boyd is a 89 y.o. female seen in follow-up for prolapse and SUI. She recently was fitted with a shaatz pessary but this keeps falling out. She is scheduled for urethral bulking today.    Past Medical History: Patient  has a past medical history of Arthritis, Breast cancer (HCC) (2017), Colon cancer (HCC) (1994), History of radiation therapy (06/16/16- 07/13/16), adenomatous colonic polyps, Personal history of radiation therapy, PONV (postoperative nausea and vomiting), and Varicose vein.   Past Surgical History: She  has a past surgical history that includes Colon resection; Ovarian cyst surgery; Abdominal hysterectomy; Tonsillectomy; Colonoscopy w/ polypectomy (last 02/18/11); Colon surgery (1994); Partial mastectomy with needle localization (Left, 04/22/2016); Breast lumpectomy (Left, 2017); Breast biopsy (Right); Breast biopsy (Right); and Total hip arthroplasty (Bilateral, 01/14/2021).   Medications: She has a current medication list which includes the following prescription(s): anastrozole , calcitonin (salmon), calcium carb-cholecalciferol, cetirizine, vitamin d3, diphenhydramine -acetaminophen , estradiol , pramipexole, and ec-rx dhea, and the following Facility-Administered Medications: ciprofloxacin, lidocaine , and lidocaine .   Allergies: Patient is allergic to sotradecol [sodium tetradecyl sulfate], celebrex [celecoxib], clindamycin, conjugated estrogens, estradiol  benzoate, estrogens conjugated, hypaque-m [diatrizone sodium-diatrizone meglumine], penicillins, propoxyphene, and tramadol.   Social History: Patient  reports that she has never smoked. She has never used smokeless tobacco. She reports that she does not drink alcohol and does not use drugs.     OBJECTIVE     Physical Exam: Vitals:   08/24/24 1102 08/24/24 1104  BP: (!) 170/84 (!) 176/82  Pulse: 68 70   Gen: No apparent distress,  A&O x 3.  Bulkamid Procedure: Time out was performed. The bladder was catheterized and 10 ml of 2% lidocaine  jelly placed in the urethra. A urethral block was performed by injecting 3ml of 1% lidocaine  with epinephrine  at 3 and 9 o'clock adjacent to the urethra.  The needle was primed.  The cystoscope was inserted to the level of the bladder neck.  The needle was inserted 2 cm and the scope was pulled back into the urethra 2 cm.  The needle was inserted bevel up at the 5 o'clock position and the Bulkamid was injected to obtain coaptation.  This was repeated at the 2 o'clock,  10 o'clock and 7 o'clock positions.   A total of 2- 1ml syringes were used and good circumferential coaptation was noted.  The patient tolerated the procedure well. She was asked to void after the procedure.   Pessary fitting:  Shaatz pessary was noted to be in place and removed. A #1 cube was placed. It was comfortable, fit well, and stayed in placed with strong cough, valsalva and bending.     Post-Void Residual (PVR) by Bladder Scan: In order to evaluate bladder emptying, we discussed obtaining a postvoid residual and she agreed to this procedure.  Procedure: The ultrasound unit was placed on the patient's abdomen in the suprapubic region after the patient had voided. A PVR of 165 ml was obtained by bladder scan.  Results for orders placed or performed in visit on 08/24/24  POCT URINE DIPSTICK   Collection Time: 08/24/24 11:19 AM  Result Value Ref Range   Color, UA yellow yellow   Clarity, UA clear clear   Glucose, UA negative negative mg/dL   Bilirubin, UA negative negative   Ketones, POC UA negative negative mg/dL   Spec Grav, UA 8.984 8.989 - 1.025   Blood, UA trace-lysed (A) negative   pH, UA 8.5 (A) 5.0 -  8.0   POC PROTEIN,UA negative negative, trace   Urobilinogen, UA 0.2 0.2 or 1.0 E.U./dL   Nitrite, UA Negative Negative   Leukocytes, UA Negative Negative    ASSESSMENT AND PLAN    Teresa Boyd is a 89  y.o. with:  1. SUI (stress urinary incontinence, female)   2. Vaginal vault prolapse after hysterectomy   3. Urinary frequency    - In and out catheter placed as patient was able to partially void. She was given precautions to call before end of the day if she is unable to void.  - Patient will follow up in 4 weeks to reassess. Voiding and post-procedure precautions were given. She will return for heavy bleeding, fevers, dysuria lasting beyond today and incomplete emptying. - Fit with a #1 cube pessary. She will continue vaginal estrogen cream.   Teresa LOISE Caper, MD

## 2024-08-24 NOTE — Patient Instructions (Signed)

## 2024-08-24 NOTE — Addendum Note (Signed)
 Addended by: Azia Toutant N on: 08/24/2024 03:19 PM   Modules accepted: Orders

## 2024-08-28 ENCOUNTER — Ambulatory Visit: Payer: Medicare (Managed Care)

## 2024-08-29 ENCOUNTER — Encounter: Payer: Self-pay | Admitting: Oncology

## 2024-08-29 ENCOUNTER — Ambulatory Visit: Payer: Medicare (Managed Care)

## 2024-09-13 ENCOUNTER — Encounter: Payer: Self-pay | Admitting: Oncology

## 2024-09-18 ENCOUNTER — Encounter: Payer: Self-pay | Admitting: Oncology

## 2024-09-18 ENCOUNTER — Ambulatory Visit: Payer: Medicare (Managed Care) | Admitting: Obstetrics and Gynecology

## 2024-09-18 ENCOUNTER — Ambulatory Visit (INDEPENDENT_AMBULATORY_CARE_PROVIDER_SITE_OTHER): Payer: Medicare (Managed Care) | Admitting: Obstetrics and Gynecology

## 2024-09-18 VITALS — BP 136/83 | HR 75

## 2024-09-18 DIAGNOSIS — N993 Prolapse of vaginal vault after hysterectomy: Secondary | ICD-10-CM

## 2024-09-18 DIAGNOSIS — N393 Stress incontinence (female) (male): Secondary | ICD-10-CM | POA: Diagnosis not present

## 2024-09-18 DIAGNOSIS — N362 Urethral caruncle: Secondary | ICD-10-CM

## 2024-09-18 DIAGNOSIS — Z960 Presence of urogenital implants: Secondary | ICD-10-CM | POA: Diagnosis not present

## 2024-09-18 DIAGNOSIS — N952 Postmenopausal atrophic vaginitis: Secondary | ICD-10-CM

## 2024-09-18 DIAGNOSIS — N812 Incomplete uterovaginal prolapse: Secondary | ICD-10-CM

## 2024-09-18 DIAGNOSIS — N8110 Cystocele, unspecified: Secondary | ICD-10-CM

## 2024-09-18 MED ORDER — ESTRADIOL 0.01 % VA CREA: 0.5000 g | TOPICAL_CREAM | VAGINAL | 11 refills | Status: AC

## 2024-09-18 NOTE — Progress Notes (Signed)
 [facility]   Subjective:     Chief Complaint:  Chief Complaint  Patient presents with   Pessary Check    Teresa Boyd is a 89 y.o. female is here for pessary check.   History of Present Illness: Teresa Boyd is a 89 y.o. female with stage II pelvic organ prolapse and stress incontinence who presents for a pessary check. She is using a size #1 cube pessary. The pessary has been working well and she has no complaints. She is not using vaginal estrogen. She denies vaginal bleeding.  Patient also recently had urethral bulking done and reports she has been happy with the results. She states she rarely has leakage, and only if she waits to long to urinate. She reports the pessary and the bulking seem to be working well for her.   Past Medical History: Patient  has a past medical history of Arthritis, Breast cancer (HCC) (2017), Colon cancer (HCC) (1994), History of radiation therapy (06/16/16- 07/13/16), adenomatous colonic polyps, Personal history of radiation therapy, PONV (postoperative nausea and vomiting), and Varicose vein.   Past Surgical History: She  has a past surgical history that includes Colon resection; Ovarian cyst surgery; Abdominal hysterectomy; Tonsillectomy; Colonoscopy w/ polypectomy (last 02/18/11); Colon surgery (1994); Partial mastectomy with needle localization (Left, 04/22/2016); Breast lumpectomy (Left, 2017); Breast biopsy (Right); Breast biopsy (Right); and Total hip arthroplasty (Bilateral, 01/14/2021).   Medications: She has a current medication list which includes the following prescription(s): anastrozole , calcitonin (salmon), calcium carb-cholecalciferol, cetirizine, vitamin d3, diphenhydramine -acetaminophen , estradiol , pramipexole, and ec-rx dhea.   Allergies: Patient is allergic to sotradecol [sodium tetradecyl sulfate], celebrex [celecoxib], clindamycin, conjugated estrogens, estradiol  benzoate, estrogens conjugated, hypaque-m [diatrizone  sodium-diatrizone meglumine], penicillins, propoxyphene, and tramadol.   Social History: Patient  reports that she has never smoked. She has never used smokeless tobacco. She reports that she does not drink alcohol and does not use drugs.      Objective:    Physical Exam: BP 136/83   Pulse 75  Gen: No apparent distress, A&O x 3. Detailed Urogynecologic Evaluation:  Pelvic Exam: Normal external female genitalia; Bartholin's and Skene's glands normal in appearance; urethral meatus with caruncle, no urethral masses or discharge. The pessary was noted to be in place. It was removed and cleaned. Speculum exam revealed mild excoriation in the vagina. The pessary was replaced. It was comfortable to the patient and fit well.     Assessment/Plan:    Assessment: Teresa Boyd is a 89 y.o. with stage II pelvic organ prolapse and stress incontinence here for a pessary check. She is doing well.  Plan: She will keep the pessary in place until next visit. She will re-start use of estrogen since she has some excoriation in the vagina. Encouraged her to use it nightly for the next week and then twice weekly after for maintenance. She will follow-up in 3 months for a pessary check or sooner as needed.  All questions were answered.   Time Spent:

## 2024-09-18 NOTE — Patient Instructions (Addendum)
 Please use estrogen cream nightly for the next week. A blueberry sized amount at the opening of the vagina.   After a week drop back down to using it twice a week.   Your appointment will be changed from January to March to follow up with Dr. Marilynne.    Please call with any issues or pain with the pessary. You may have a little bit of spotting for a couple days, the estrogen cream will help this.

## 2024-09-19 ENCOUNTER — Ambulatory Visit: Payer: Medicare (Managed Care)

## 2024-09-22 ENCOUNTER — Ambulatory Visit: Admitting: Obstetrics and Gynecology

## 2024-10-06 ENCOUNTER — Ambulatory Visit: Admitting: Obstetrics and Gynecology

## 2024-10-09 ENCOUNTER — Encounter: Payer: Self-pay | Admitting: Oncology

## 2024-10-11 ENCOUNTER — Ambulatory Visit: Payer: Medicare (Managed Care) | Attending: Orthopedic Surgery

## 2024-10-11 ENCOUNTER — Encounter: Payer: Self-pay | Admitting: Oncology

## 2024-10-11 ENCOUNTER — Other Ambulatory Visit: Payer: Self-pay

## 2024-10-11 DIAGNOSIS — R293 Abnormal posture: Secondary | ICD-10-CM | POA: Insufficient documentation

## 2024-10-11 DIAGNOSIS — R262 Difficulty in walking, not elsewhere classified: Secondary | ICD-10-CM | POA: Diagnosis present

## 2024-10-11 DIAGNOSIS — R2681 Unsteadiness on feet: Secondary | ICD-10-CM | POA: Insufficient documentation

## 2024-10-11 DIAGNOSIS — M25662 Stiffness of left knee, not elsewhere classified: Secondary | ICD-10-CM | POA: Insufficient documentation

## 2024-10-11 DIAGNOSIS — M6281 Muscle weakness (generalized): Secondary | ICD-10-CM | POA: Insufficient documentation

## 2024-10-11 NOTE — Therapy (Signed)
 " OUTPATIENT PHYSICAL THERAPY LOWER EXTREMITY EVALUATION   Patient Name: Teresa Boyd MRN: [id number] DOB:1931/07/13, 89 y.o., female Today's Date: 10/11/2024  END OF SESSION:  PT End of Session - 10/11/24 0925     Visit Number 1    Date for Recertification  01/03/25    Progress Note Due on Visit 10    PT Start Time 0928    PT Stop Time 1015    PT Time Calculation (min) 47 min    Activity Tolerance Patient tolerated treatment well    Behavior During Therapy [facility] for tasks assessed/performed          Past Medical History:  Diagnosis Date   Arthritis    Breast cancer (HCC) 2017   left breast   Colon cancer (HCC) 1994   History of radiation therapy 06/16/16- 07/13/16   Left Breast   Hx of adenomatous colonic polyps    Personal history of radiation therapy    PONV (postoperative nausea and vomiting)    nausea only   Varicose vein    Past Surgical History:  Procedure Laterality Date   ABDOMINAL HYSTERECTOMY     BREAST BIOPSY Right    BREAST BIOPSY Right    BREAST LUMPECTOMY Left 2017   COLON RESECTION     sigmoid   COLON SURGERY  1994   colon cancer   COLONOSCOPY W/ POLYPECTOMY  last 02/18/11   multiple, prior colon cancer and polyps, 7 mm adenoma 2012   OVARIAN CYST SURGERY     PARTIAL MASTECTOMY WITH NEEDLE LOCALIZATION Left 04/22/2016   Procedure: LEFT PARTIAL MASTECTOMY WITH DOUBLE NEEDLE LOCALIZATION REEXCISION INFERIOR AND LATERAL MARGIN ADJACENT TISSUE TRANSFER;  Surgeon: Elon Pacini, MD;  Location: [facility];  Service: General;  Laterality: Left;   TONSILLECTOMY     TOTAL HIP ARTHROPLASTY Bilateral 01/14/2021   Procedure: TOTAL HIP ARTHROPLASTY ANTERIOR APPROACH WITH LEFT KNEE CORTISONE INJECTION;  Surgeon: Yvone Rush, MD;  Location: [facility];  Service: Orthopedics;  Laterality: Bilateral;   Patient Active Problem List   Diagnosis Date Noted   Primary osteoarthritis of right hip 01/14/2021   Status post total hip replacement, right  01/14/2021   Malignant neoplasm of upper-outer quadrant of left breast in female, estrogen receptor positive (HCC) 03/27/2016   Increased frequency of urination 01/31/2014   Allergic rhinitis due to pollen 12/30/2011   Hx of herpes zoster 12/30/2011   Leukocytopenia 12/30/2011   Symptoms involving cardiovascular system 12/30/2011   Uncomplicated varicose veins 12/30/2011   Vitamin D  deficiency 12/30/2011   HEMORRHOIDS-EXTERNAL 11/08/2008   HIATAL HERNIA 02/16/2007   Osteoporosis 02/16/2007   History of malignant neoplasm of large intestine 02/16/2007    PCP: Joshua Santana CROME NP  REFERRING PROVIDER: Sherida Adine BROCKS  REFERRING DIAG: osteoarthritis L knee, order from orthopedist for gait training and balance  THERAPY DIAG:  Difficulty in walking, not elsewhere classified  Abnormal posture  Muscle weakness (generalized)  Unsteadiness on feet  Stiffness of left knee, not elsewhere classified  Rationale for Evaluation and Treatment: Rehabilitation  ONSET DATE: 07/25/24 date of referral  SUBJECTIVE:   SUBJECTIVE STATEMENT: The patient reports she is concerned about maintaining her balance, her L knee knocks into her R leg which throws her off. She did have an injection L knee recently which helped, although she doesn't really have pain.  Her biggest issues in regard to her mobility are moving sit to stand from different surfaces as well as walking on her uneven path to her  carport.  Her son who lives in [place]  has purchased a metallurgist for her and he also wants her to attend physical therapy to address her balance and safety.    PERTINENT HISTORY: Referred by orthopedist for gait training and balance PAIN:  Are you having pain? Pt reports no pain in her L knee, just difficulty balancing due to its shape and swelling  PRECAUTIONS: Fall  RED FLAGS: None   WEIGHT BEARING RESTRICTIONS: No  FALLS:  Has patient fallen in last 6 months? No  LIVING ENVIRONMENT: Lives with:  lives alone Lives in: House/apartment Stairs: Yes: External: 2 steps; on left going up Has following equipment at home: Single point cane  OCCUPATION: retired  PLOF: Independent  PATIENT GOALS: avoid falls   NEXT MD VISIT: unknown  OBJECTIVE:  Note: Objective measures were completed at Evaluation unless otherwise noted.  DIAGNOSTIC FINDINGS: not available but MD orders state advanced L knee osteoarthritis  PATIENT SURVEYS:  PSFS: THE PATIENT SPECIFIC FUNCTIONAL SCALE  Place score of 0-10 (0 = unable to perform activity and 10 = able to perform activity at the same level as before injury or problem)  Activity Date: 10/11/24    Sit to stand from various surfaces 5    2. Walking on uneven side walk to her car port 5    3.      4.      Total Score 10/2: 5      Total Score = Sum of activity scores/number of activities  Minimally Detectable Change: 3 points (for single activity); 2 points (for average score)  [facility] (nd). The Patient Specific Functional Scale . Retrieved from [email or web address]   COGNITION: Overall cognitive status: Within functional limits for tasks assessed     SENSATION: WFL  EDEMA:  Mod edema L medial knee jt   MUSCLE LENGTH: Hamstrings: Right wfl deg; Left -25 deg Thomas test: Right nt deg; Left nt deg  POSTURE: valgum B knees, much more pronounced on L  PALPATION: Valgum B knees, more pronounced L than R   LOWER EXTREMITY ROM:   Active ROM Right eval Left eval  Hip flexion    Hip extension    Hip abduction    Hip adduction    Hip internal rotation    Hip external rotation    Knee flexion 130 120  Knee extension 0 -15  Ankle dorsiflexion    Ankle plantarflexion    Ankle inversion    Ankle eversion     (Blank rows =wfl)  LOWER EXTREMITY MMT:  MMT Right eval Left eval  Hip flexion    Hip extension    Hip abduction    Hip adduction    Hip internal  rotation    Hip external rotation    Knee flexion  4  Knee extension  4p!  Ankle dorsiflexion    Ankle plantarflexion 4 4  Ankle inversion    Ankle eversion     (Blank rows = not tested)  LOWER EXTREMITY SPECIAL TESTS:  na  FUNCTIONAL TESTS:  FGA 11/30  GAIT: Distance walked: in clinic up to 100' Assistive device utilized: None Level of assistance: Complete Independence Comments: scissoring, valgum L knee, mild loss of stance time on L  TREATMENT DATE: 10/11/24    PATIENT EDUCATION:  Education details: POC, goals Person educated: Patient Education method: Explanation, Demonstration, Tactile cues, Verbal cues, and Handouts Education comprehension: verbalized understanding, returned demonstration, and verbal cues required  HOME EXERCISE PROGRAM: TBD  ASSESSMENT:  CLINICAL IMPRESSION: Patient is a 89 y.o. female  who was evaluated by physical therapy today to address instability and fall risk, with known L knee osteoarthritis.  She lives alone, drives, maintains her home, obtains her groceries.  Her physician and son have noticed more instability with her gait pattern more recently, so she was referred to PT. She did score 11/30 on the FGA which is indicative of fall risk. She did have marked crepitus L knee with MMT.  Should benefit from physical therapy with supervised progression of LE strengthening and balance activities to tolerance.    OBJECTIVE IMPAIRMENTS: decreased balance, decreased endurance, decreased knowledge of use of DME, difficulty walking, decreased strength, postural dysfunction, and pain.   ACTIVITY LIMITATIONS: carrying, standing, squatting, transfers, and locomotion level  PARTICIPATION LIMITATIONS: laundry, community activity, and yard work  PERSONAL FACTORS: Age, Fitness, and 1 comorbidity: advanced L knee OA are also  affecting patient's functional outcome.   REHAB POTENTIAL: Good  CLINICAL DECISION MAKING: Evolving/moderate complexity  EVALUATION COMPLEXITY: Moderate   GOALS: Goals reviewed with patient? Yes  SHORT TERM GOALS: Target date: 2 weeks : 10/25/24 I HEP Baseline: Goal status: INITIAL   LONG TERM GOALS: Target date: 01/03/25, 12 weeks   Improve strength L quads, hamstrings to 5/5 with burst testing Baseline:  Goal status: INITIAL  2.  Improve FGA score from 11/30 to 20/30 for improved balance Baseline:  Goal status: INITIAL  3.  Improve PSFS from 5 to 7 Baseline:  Goal status: INITIAL   PLAN:  PT FREQUENCY: 2x/week  PT DURATION: 12 weeks  PLANNED INTERVENTIONS: 97110-Therapeutic exercises, 97530- Therapeutic activity, 97112- Neuromuscular re-education, 97535- Self Care, [id number]- Manual therapy, and Patient/Family education  PLAN FOR NEXT SESSION: initiate light cardiovascular  exercise, gentle strengthening for B LE's    Starlene Consuegra L Jimena Wieczorek, PT, DPT< OCS 10/11/2024, 3:33 PM  "

## 2024-10-24 ENCOUNTER — Ambulatory Visit: Payer: Medicare (Managed Care)

## 2024-10-26 ENCOUNTER — Encounter: Payer: Self-pay | Admitting: Oncology

## 2024-10-26 ENCOUNTER — Ambulatory Visit: Admitting: Obstetrics and Gynecology

## 2024-10-26 ENCOUNTER — Ambulatory Visit: Payer: Medicare (Managed Care) | Admitting: Physical Therapy

## 2024-10-31 ENCOUNTER — Ambulatory Visit: Payer: Medicare (Managed Care)

## 2024-11-02 ENCOUNTER — Ambulatory Visit: Payer: Medicare (Managed Care) | Admitting: Physical Therapy

## 2024-11-07 ENCOUNTER — Ambulatory Visit: Payer: Medicare (Managed Care) | Admitting: Physical Therapy

## 2024-11-09 ENCOUNTER — Ambulatory Visit: Payer: Medicare (Managed Care)

## 2024-11-24 ENCOUNTER — Ambulatory Visit: Admitting: Obstetrics and Gynecology

## 2024-12-26 ENCOUNTER — Ambulatory Visit: Admitting: Obstetrics and Gynecology
